# Patient Record
Sex: Female | Born: 1940 | Race: White | Hispanic: No | Smoking: Never smoker
Health system: Southern US, Community
[De-identification: ages and names within clinical notes are randomized; demographics above are authoritative.]

## PROBLEM LIST (undated history)

## (undated) DIAGNOSIS — N200 Calculus of kidney: Secondary | ICD-10-CM

## (undated) DIAGNOSIS — I10 Essential (primary) hypertension: Secondary | ICD-10-CM

## (undated) DIAGNOSIS — D3500 Benign neoplasm of unspecified adrenal gland: Secondary | ICD-10-CM

## (undated) DIAGNOSIS — E7800 Pure hypercholesterolemia, unspecified: Secondary | ICD-10-CM

## (undated) DIAGNOSIS — I480 Paroxysmal atrial fibrillation: Secondary | ICD-10-CM

## (undated) DIAGNOSIS — K219 Gastro-esophageal reflux disease without esophagitis: Secondary | ICD-10-CM

## (undated) HISTORY — DX: Benign neoplasm of unspecified adrenal gland: D35.00

## (undated) HISTORY — DX: Paroxysmal atrial fibrillation: I48.0

## (undated) HISTORY — DX: Calculus of kidney: N20.0

## (undated) HISTORY — DX: Essential (primary) hypertension: I10

---

## 1987-08-18 HISTORY — PX: CHOLECYSTECTOMY: SHX55

## 1998-06-21 ENCOUNTER — Ambulatory Visit (HOSPITAL_COMMUNITY): Admission: RE | Admit: 1998-06-21 | Discharge: 1998-06-21 | Payer: Self-pay | Admitting: Family Medicine

## 1998-06-21 ENCOUNTER — Encounter: Payer: Self-pay | Admitting: Family Medicine

## 2000-09-01 ENCOUNTER — Ambulatory Visit (HOSPITAL_COMMUNITY): Admission: RE | Admit: 2000-09-01 | Discharge: 2000-09-01 | Payer: Self-pay | Admitting: Family Medicine

## 2000-09-01 ENCOUNTER — Encounter: Payer: Self-pay | Admitting: Family Medicine

## 2001-09-01 ENCOUNTER — Ambulatory Visit (HOSPITAL_COMMUNITY): Admission: RE | Admit: 2001-09-01 | Discharge: 2001-09-01 | Payer: Self-pay | Admitting: Family Medicine

## 2001-09-01 ENCOUNTER — Encounter: Payer: Self-pay | Admitting: Family Medicine

## 2001-09-29 ENCOUNTER — Other Ambulatory Visit: Admission: RE | Admit: 2001-09-29 | Discharge: 2001-09-29 | Payer: Self-pay | Admitting: Family Medicine

## 2003-04-10 ENCOUNTER — Other Ambulatory Visit: Admission: RE | Admit: 2003-04-10 | Discharge: 2003-04-10 | Payer: Self-pay | Admitting: Family Medicine

## 2005-11-03 ENCOUNTER — Other Ambulatory Visit: Admission: RE | Admit: 2005-11-03 | Discharge: 2005-11-03 | Payer: Self-pay | Admitting: Family Medicine

## 2006-09-03 ENCOUNTER — Emergency Department (HOSPITAL_COMMUNITY): Admission: EM | Admit: 2006-09-03 | Discharge: 2006-09-03 | Payer: Self-pay | Admitting: Emergency Medicine

## 2009-06-19 ENCOUNTER — Other Ambulatory Visit: Admission: RE | Admit: 2009-06-19 | Discharge: 2009-06-19 | Payer: Self-pay | Admitting: Family Medicine

## 2009-09-17 ENCOUNTER — Ambulatory Visit (HOSPITAL_BASED_OUTPATIENT_CLINIC_OR_DEPARTMENT_OTHER): Admission: RE | Admit: 2009-09-17 | Discharge: 2009-09-17 | Payer: Self-pay | Admitting: Otolaryngology

## 2010-01-15 DIAGNOSIS — D3500 Benign neoplasm of unspecified adrenal gland: Secondary | ICD-10-CM

## 2010-01-15 DIAGNOSIS — N200 Calculus of kidney: Secondary | ICD-10-CM

## 2010-01-15 HISTORY — DX: Calculus of kidney: N20.0

## 2010-01-15 HISTORY — DX: Benign neoplasm of unspecified adrenal gland: D35.00

## 2010-02-06 ENCOUNTER — Encounter: Admission: RE | Admit: 2010-02-06 | Discharge: 2010-02-06 | Payer: Self-pay | Admitting: Family Medicine

## 2010-02-19 ENCOUNTER — Encounter: Admission: RE | Admit: 2010-02-19 | Discharge: 2010-02-19 | Payer: Self-pay | Admitting: Family Medicine

## 2010-10-28 ENCOUNTER — Encounter (HOSPITAL_COMMUNITY): Payer: Self-pay

## 2010-11-06 ENCOUNTER — Ambulatory Visit (HOSPITAL_COMMUNITY): Payer: Medicare Other | Attending: Family Medicine

## 2010-11-06 DIAGNOSIS — M810 Age-related osteoporosis without current pathological fracture: Secondary | ICD-10-CM | POA: Insufficient documentation

## 2010-12-11 ENCOUNTER — Encounter: Payer: Self-pay | Admitting: Internal Medicine

## 2011-01-17 ENCOUNTER — Encounter: Payer: Self-pay | Admitting: Internal Medicine

## 2011-01-21 ENCOUNTER — Ambulatory Visit (INDEPENDENT_AMBULATORY_CARE_PROVIDER_SITE_OTHER): Payer: Medicare Other | Admitting: Internal Medicine

## 2011-01-21 ENCOUNTER — Encounter: Payer: Self-pay | Admitting: Internal Medicine

## 2011-01-21 VITALS — BP 188/88 | HR 56 | Ht 63.0 in | Wt 137.0 lb

## 2011-01-21 DIAGNOSIS — I4891 Unspecified atrial fibrillation: Secondary | ICD-10-CM

## 2011-01-21 DIAGNOSIS — I498 Other specified cardiac arrhythmias: Secondary | ICD-10-CM

## 2011-01-21 DIAGNOSIS — I10 Essential (primary) hypertension: Secondary | ICD-10-CM

## 2011-01-21 DIAGNOSIS — R001 Bradycardia, unspecified: Secondary | ICD-10-CM

## 2011-01-21 DIAGNOSIS — R079 Chest pain, unspecified: Secondary | ICD-10-CM | POA: Insufficient documentation

## 2011-01-21 HISTORY — DX: Unspecified atrial fibrillation: I48.91

## 2011-01-21 HISTORY — DX: Bradycardia, unspecified: R00.1

## 2011-01-21 NOTE — Assessment & Plan Note (Signed)
This has undergone evaluation by Dr. Mayford Knife. I doubt that she has coronary disease. I have asked the patient follow back up with her primary cardiologist if her symptoms return.

## 2011-01-21 NOTE — Assessment & Plan Note (Signed)
She is currently asymptomatic. Ultimately she may require permanent pacemaker insertion. At the present time I think this can be avoided though

## 2011-01-21 NOTE — Patient Instructions (Signed)
Your physician recommends that you schedule a follow-up appointment in: 6-8 weeks with Dr Ladona Ridgel  Your physician recommends that you continue on your current medications as directed. Please refer to the Current Medication list given to you today.

## 2011-01-21 NOTE — Progress Notes (Signed)
HPI [name] returns today for followup. She is a pleasant 70 yo woman with a h/o PAF, HTN, bradycardia and is on Rhythmol. She has multiple complaints today. He recently she had experienced palpitations and rapid heart rates. On her current dose of Rythmol, her symptoms are much improved. She does complain of chest discomfort. She has undergone perfusion stress testing under the direction of Dr. Carolanne Grumbling. By the patient's report, this was all negative. She denies peripheral edema. She denies PND and denies orthopnea. She has not had syncope.she notes that when her heart is in rhythm, her blood pressure tends to be elevated. When her heart is out of rhythm, her blood pressure tends to be lower. In addition, when she is out of rhythm she feels fatigue, weakness, and may days. Allergies  Allergen Reactions  . Codeine      Current Outpatient Prescriptions  Medication Sig Dispense Refill  . acetaminophen (TYLENOL) 325 MG tablet Take 325 mg by mouth every 6 (six) hours as needed.        Marland Kitchen aspirin 81 MG tablet Take 81 mg by mouth 2 (two) times daily.        . metoprolol (LOPRESSOR) 100 MG tablet Take 100 mg by mouth 2 (two) times daily.        Marland Kitchen omeprazole (PRILOSEC) 20 MG capsule Take 20 mg by mouth daily.        . propafenone (RYTHMOL SR) 325 MG 12 hr capsule Take 325 mg by mouth 2 (two) times daily.        . ranitidine (ZANTAC) 150 MG tablet Take 150 mg by mouth daily.        Marland Kitchen warfarin (COUMADIN) 5 MG tablet as directed.        . zoledronic acid (RECLAST) 5 MG/100ML SOLN As directed once a yr osteoporosis       . DISCONTD: Omeprazole-Sodium Bicarbonate (ZEGERID) 20-1100 MG CAPS Take 1 capsule by mouth.           Past Medical History  Diagnosis Date  . Paroxysmal atrial fibrillation     chads score 2 but she has maintained NSR on Rhythmol until 11/21/10  . Hypertension   . Normal nuclear stress test 2008    low risk  . Osteoporosis   . Kidney stone 6/11    rt. on ct 6/11  . Adrenal  adenoma 6/11    lt on ct 6/11    ROS:   All systems reviewed and negative except as noted in the HPI.   Past Surgical History  Procedure Date  . Cholecystectomy 1989  . Cesarean section 1978-1979    x-2     Family History  Problem Relation Age of Onset  . Heart attack Father   . Hip fracture Mother     complication  . Lung cancer Brother   . Heart attack Brother     massive  . Other Brother     mva  . Thyroid cancer Sister   . Atrial fibrillation Sister   . Ovarian cancer Sister     surviver     History   Social History  . Marital Status: Widowed    Spouse Name: N/A    Number of Children: N/A  . Years of Education: N/A   Occupational History  . Not on file.   Social History Main Topics  . Smoking status: Never Smoker   . Smokeless tobacco: Not on file  . Alcohol Use: No  . Drug Use:  No  . Sexually Active: Not on file   Other Topics Concern  . 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 HEART:  Regular rate rhythm, no murmurs, no rubs, no clicks Abd:  Flat, positive bowel sounds, no organomegally, no rebound, no guarding Ext:  2 plus pulses, no edema, no cyanosis, no clubbing Skin:  No rashes no nodules Neuro:  CN II through XII intact, motor grossly intact  EKG Sinus bradycardia  Assess/Plan:

## 2011-01-21 NOTE — Assessment & Plan Note (Signed)
Today she is in sinus rhythm and it sounds like from her history that her a fibrillation has been well controlled for last couple of weeks. We talked about the treatment options with regard to her atrial fibrillation. Her electrocardiogram today does not show any evidence of prolongation of her QRS. For this reason I would recommend that she continue her current antiarrhythmic drug and followup with me in several weeks. I have asked her to keep a log of her heart rate and blood pressure so that we can try to understand better how much atrial fibrillation she is having.

## 2011-03-17 ENCOUNTER — Ambulatory Visit (INDEPENDENT_AMBULATORY_CARE_PROVIDER_SITE_OTHER): Payer: Medicare Other | Admitting: Internal Medicine

## 2011-03-17 ENCOUNTER — Encounter: Payer: Self-pay | Admitting: Internal Medicine

## 2011-03-17 DIAGNOSIS — I4891 Unspecified atrial fibrillation: Secondary | ICD-10-CM

## 2011-03-17 DIAGNOSIS — R079 Chest pain, unspecified: Secondary | ICD-10-CM

## 2011-03-17 DIAGNOSIS — I498 Other specified cardiac arrhythmias: Secondary | ICD-10-CM

## 2011-03-17 DIAGNOSIS — R001 Bradycardia, unspecified: Secondary | ICD-10-CM

## 2011-03-17 NOTE — Patient Instructions (Signed)
Your physician wants you to follow-up in: 6 months with Dr Taylor You will receive a reminder letter in the mail two months in advance. If you don't receive a letter, please call our office to schedule the follow-up appointment.  

## 2011-03-18 ENCOUNTER — Encounter: Payer: Self-pay | Admitting: Internal Medicine

## 2011-03-18 NOTE — Assessment & Plan Note (Signed)
Her symptoms have been well controlled. I have asked her to continue her current meds.

## 2011-03-18 NOTE — Assessment & Plan Note (Signed)
Her symptoms are well controlled. Continue current meds.

## 2011-03-18 NOTE — Assessment & Plan Note (Signed)
Her bradycardia remains asymptomatic.

## 2011-03-18 NOTE — Progress Notes (Signed)
HPI [name] returns today for followup. She is a pleasant 70 yo woman with a h/o PAF, HTN, chronic coumadin therapy. She denies c/p, sob, or peripheral edema. She has had minimal peripheral edema. Allergies  Allergen Reactions  . Codeine      Current Outpatient Prescriptions  Medication Sig Dispense Refill  . acetaminophen (TYLENOL) 325 MG tablet Take 325 mg by mouth every 6 (six) hours as needed.        . Cholecalciferol (VITAMIN D3) 1000 UNITS CAPS Take 1 capsule by mouth daily.        . metoprolol (LOPRESSOR) 100 MG tablet Take 100 mg by mouth 2 (two) times daily.        Marland Kitchen omeprazole (PRILOSEC) 20 MG capsule Take 20 mg by mouth daily.        . propafenone (RYTHMOL SR) 325 MG 12 hr capsule Take 325 mg by mouth 2 (two) times daily.        . ranitidine (ZANTAC) 150 MG tablet Take 150 mg by mouth daily.        Marland Kitchen warfarin (COUMADIN) 5 MG tablet as directed.        . zoledronic acid (RECLAST) 5 MG/100ML SOLN As directed once a yr osteoporosis          Past Medical History  Diagnosis Date  . Paroxysmal atrial fibrillation     chads score 2 but she has maintained NSR on Rhythmol until 11/21/10  . Hypertension   . Normal nuclear stress test 2008    low risk  . Osteoporosis   . Kidney stone 6/11    rt. on ct 6/11  . Adrenal adenoma 6/11    lt on ct 6/11    ROS:   All systems reviewed and negative except as noted in the HPI.   Past Surgical History  Procedure Date  . Cholecystectomy 1989  . Cesarean section 1978-1979    x-2     Family History  Problem Relation Age of Onset  . Heart attack Father   . Hip fracture Mother     complication  . Lung cancer Brother   . Heart attack Brother     massive  . Other Brother     mva  . Thyroid cancer Sister   . Atrial fibrillation Sister   . Ovarian cancer Sister     surviver     History   Social History  . Marital Status: Widowed    Spouse Name: N/A    Number of Children: N/A  . Years of Education: N/A    Occupational History  . Not on file.   Social History Main Topics  . Smoking status: Never Smoker   . Smokeless tobacco: Not on file  . Alcohol Use: No  . Drug Use: No  . Sexually Active: Not on file   Other Topics Concern  . 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 HEART:  Regular rate rhythm, no murmurs, no rubs, no clicks Abd:  soft, positive bowel sounds, no organomegally, no rebound, no guarding Ext:  2 plus pulses, no edema, no cyanosis, no clubbing Skin:  No rashes no nodules Neuro:  CN II through XII intact, motor grossly intact   Assess/Plan:

## 2011-05-06 ENCOUNTER — Other Ambulatory Visit: Payer: Self-pay | Admitting: Gastroenterology

## 2011-05-06 DIAGNOSIS — R109 Unspecified abdominal pain: Secondary | ICD-10-CM

## 2011-05-11 ENCOUNTER — Ambulatory Visit
Admission: RE | Admit: 2011-05-11 | Discharge: 2011-05-11 | Disposition: A | Discharge status: Home / Self Care | Payer: Medicare Other | Source: Ambulatory Visit | Attending: Gastroenterology | Admitting: Gastroenterology

## 2011-05-11 DIAGNOSIS — R109 Unspecified abdominal pain: Secondary | ICD-10-CM

## 2011-05-11 MED ORDER — IOHEXOL 300 MG/ML  SOLN: 100.0000 mL | Freq: Once | INTRAMUSCULAR | Status: AC | PRN

## 2011-11-05 ENCOUNTER — Encounter (HOSPITAL_COMMUNITY): Payer: Self-pay | Admitting: Pharmacy Technician

## 2011-11-06 ENCOUNTER — Other Ambulatory Visit: Payer: Self-pay | Admitting: Cardiology

## 2011-11-06 ENCOUNTER — Encounter: Payer: Self-pay | Admitting: Cardiology

## 2011-11-06 NOTE — H&P (Signed)
Office Visit     Patient: Monica [name], Monica [name] Provider: Armanda Magic, MD  DOB: 11-07-1940 Age: 71 Y Sex: Female Date: 11/05/2011  Phone: [phone]   Address: [address], [place], [id number]  Pcp: CANDACE SMITH       Subjective:     CC:    1. NUCLEAR FOLLOW UP.        HPI:  General:  Mrs Skousen is a 71 yo female with a hx of Atrial fibrillation. She had the Paroxysmal Atrial fib starting 2008, that reoccurred in 2010, then 4/12 had to go to ED with Atrial fibrillation, She was then placed her on Coumadin therapy. 4/12: testing including nuclear stress test without ischemia, echocardiogram normal LV function, with stage 3 diastolic dysfunction, mild - mod TR/MR. In October last year she was having episodes of increse HR in the AFib with Diltiazem added to her therapy and her heart rates have been well controlled since. She was seen by my NP after she had episode of chest pain that awakened her from sleep radiating into back, she used Tum and heating pad but remained for 1 hour then eased off but she did have soreness in back that has resolved. She had not taken her pm Omeprazole dosing. This was associated with diaphoresis, no GI complaints, palpitaitons, dizziness nor syncope. She also noted chest was soreness while lying on left side. only. Since then she has noted increase fatigue but no futher chest pain. She underwent nuclear stress test that showed no ischemia. Since then she has gone back on Omeprazole. She now has burning in her chest which occurs mainly at night. She took Mylanta which improved the discomfort. This am she felt weakness and took her BP and it was 112/61 with a HR of 37bpm..        ROS:  See HPI, A twelve system review was perfomed at today's visit. For pertinent positives and negatives see HPI.       Medical History: paroxysmal atrial fibrillation chads score 2 but she has maintained NSR on Rhythmol until 11/21/10, Hypertension, NUC stress 2008 - low risk. ,  Echo - 2008 - LA 46mm, normal EF, normal wall thickness LV, mild MR, TR., Osteoporosis, R kidney stone on CT 6/11, L adrenal adenoma on CT 6/11, normal nuclear stress test 11/2010 Dr.Lux Meaders.        Family History:        Social History:  General:  History of smoking cigarettes: Never smoked.  no Smoking.  no Tobacco Exposure.  no Alcohol.  Caffeine: yes, 1 serving daily, coffee.  no Recreational drug use.  Exercise: nothing structured.  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  Education: [employer].  Marital Status: single, widowed, 2006.  Children: 2 children both boys, grandchild x 1.  Religion: not at the moment.  Firearms: yes, gun safety is practiced.  Seat belt use: always.        Medications: Omeprazole 20 MG Capsule Delayed Release 1 capsule twice daily, Reclast 5 MG/100ML Solution as directed once a yr for osteoporosis, Zantac 75 mg Tablet 1 tablet once a day, Acetaminophen 500 MG Capsule 1 capsule as needed every 6 hrs as needed, Rythmol SR 325 MG Dr. Mayford Knife Capsule Extended Release 12 Hour 1 capsule Twice a day, Metoprolol Tartrate 100 MG Dr. Mayford Knife Tablet 1 tablet twice a day, Warfarin Sodium 5 MG (DR Jiah Bari) Tablet 1/2 tablet once a day or as directecd, Dilt-CD 120 MG dr. Windy Kalata Extended Release 24  Hour 1 capsule Once a day, Medication List reviewed and reconciled with the patient       Allergies: Codeine (for allergy): nausea: Side Effects.       Objective:     Vitals: Wt 148, Wt change 1.8 lb, Ht 63, BMI 26.21, Pulse sitting 80, BP sitting 158/94.       Examination:  Cardiology, General:  GENERAL APPEARANCE: pleasant, NAD.  HEENT: unremarkable.  CAROTID UPSTROKE: normal, no bruit.  JVD: flat.  HEART SOUNDS: regular, normal S1, S2, no S3 or S4.  MURMUR: absent.  LUNGS: no rales or wheezes.  ABDOMEN: soft, non tender, positive bowel sounds, no masses felt.  EXTREMITIES: no leg edema.  PERIPHERAL PULSES: 2 plus bilateral.          Assessment:     Assessment:  1. Atrial fibrillation - 427.31 (Primary)  2. Anticoagulated - V58.61  3. Benign hypertensive heart disease without heart failure - 402.10  4. Esophageal reflux - 530.81  5. Diastolic dysfunction - 429.9  6. Weakness - 780.79    Plan:     1. Atrial fibrillation Continue Rythmol SR Capsule Extended Release 12 Hour, 325 MG Dr. Mayford Knife, 1 capsule, Orally, Twice a day ; Continue Metoprolol Tartrate Tablet, 100 MG Dr. Mayford Knife, 1 tablet, Orally, twice a day ; Continue Warfarin Sodium Tablet, 5 MG (DR Javaeh Muscatello), 1/2 tablet, Orally, once a day or as directecd ; Continue Dilt-CD Capsule Extended Release 24 Hour, 120 MG dr. Mayford Knife, 1 capsule, Orally, Once a day .  LAB: Basic Metabolic Normal    GLUCOSE 106 70-99 - mg/dL H   BUN 18 4-54 - mg/dL    CREATININE 0.98 1.19-1.47 - mg/dl    eGFR (NON-AFRICAN AMERICAN) 59 >60 - calc L   eGFR (AFRICAN AMERICAN) 72 >60 - calc    SODIUM 139 136-145 - mmol/L    POTASSIUM 4.5 3.5-5.5 - mmol/L    CHLORIDE 104 98-107 - mmol/L    C02 28 22-32 - mg/dL    ANION GAP 82.9 5.6-21.3 - mmol/L    CALCIUM 9.8 8.6-10.3 - mg/dL     Polina Burmaster M 08/65/7846 02:50:48 PM > was this fasting Redmond Baseman 11/05/2011 03:05:15 PM > Pt not fasting. Janina Mayo 11/05/2011 03:13:34 PM > let he know labs ok Redmond Baseman 11/05/2011 03:50:26 PM > Pt notified.    LAB: CBC with Diff Normal    WBC 7.7 4.0-11.0 - K/ul    RBC 4.35 4.20-5.40 - M/uL    HGB 12.7 12.0-16.0 - g/dL    HCT 96.2 95.2-84.1 - %    MCH 29.3 27.0-33.0 - pg    MPV 7.8 7.5-10.7 - fL    MCV 89.5 81.0-99.0 - fL    MCHC 32.7 32.0-36.0 - g/dL    RDW 32.4 40.1-02.7 - %    PLT 241 150-400 - K/uL    NEUT % 76.5 43.3-71.9 - % H   LYMPH% 15.5 16.8-43.5 - % L   MONO % 6.3 4.6-12.4 - %    EOS % 1.1 0.0-7.8 - %    BASO % 0.6 0.0-1.0 - %    NEUT # 5.9 1.9-7.2 - K/uL    LYMPH# 1.20 1.10-2.70 - K/uL    MONO # 0.5 0.3-0.8 - K/uL    EOS # 0.1 0.0-0.6 - K/uL    BASO # 0.0  0.0-0.1 - K/uL     Humna Moorehouse M 11/05/2011 11:00:52 AM > Redmond Baseman 11/05/2011 01:45:33 PM > Pt notified.    LAB: PT (Prothrombin  Time) (161096) okay for cardioversion    Prothrombin Time 32.5 9.1-12.0 - SEC H   INR 3.1 0.8-1.2 - H    Smart,Jeremy 11/06/2011 10:36:14 AM > INR okay for CV. pending cardioversion 11/10/11. patient to continue warfarin 2.5 mg qd and recheck INR with me 11/2011 as scheduled. Janina Mayo 11/06/2011 10:57:38 AM > agree    She is still in afib today by exam so I will check a PT/INR and if therapeutic will set up for DCCV.       2. Benign hypertensive heart disease without heart failure Continue Dilt-CD Capsule Extended Release 24 Hour, 120 MG dr. Mayford Knife, 1 capsule, Orally, Once a day ; Continue Metoprolol Tartrate Tablet, 100 MG Dr. Mayford Knife, 1 tablet, Orally, twice a day .       3. Esophageal reflux Continue Omeprazole Capsule Delayed Release, 20 MG, 1 capsule, Orally, twice daily ; Continue Zantac Tablet, 75 mg, 1 tablet, Orally, once a day .  I reassured her that her stress test was normal. Her chest burning comes on mainly at night and improves on omeprazole and mylanta. I suspect she is having an exacerbation of her GERD symptoms. I have recommended that we refer her to GI for further evaluation.  Referral EA:[facility] Reason:EGI - CONSULT 11/09/2011       4. Weakness  I suspect her weakness is due to afib.        Immunizations:        Labs:        Procedure Codes: 11914 ECL BMP, [id number] ECL CBC PLATELET DIFF, [id number] BLOOD COLLECTION ROUTINE VENIPUNCTURE       Preventive:        Provider: Armanda Magic, MD  Patient: Kiandria, Clum DOB: October 05, 1940 Date: 11/05/2011

## 2011-11-07 NOTE — Progress Notes (Signed)
Addended by: Armanda Magic on: 11/07/2011 10:23 AM   Modules accepted: Orders

## 2011-11-10 ENCOUNTER — Ambulatory Visit (HOSPITAL_COMMUNITY)
Admission: RE | Admit: 2011-11-10 | Discharge: 2011-11-10 | Disposition: A | Discharge status: Home / Self Care | Payer: Medicare Other | Source: Ambulatory Visit | Attending: Cardiology | Admitting: Cardiology

## 2011-11-10 ENCOUNTER — Other Ambulatory Visit: Payer: Self-pay

## 2011-11-10 ENCOUNTER — Encounter (HOSPITAL_COMMUNITY)
Admission: RE | Disposition: A | Discharge status: Home / Self Care | Payer: Self-pay | Source: Ambulatory Visit | Attending: Cardiology

## 2011-11-10 DIAGNOSIS — I119 Hypertensive heart disease without heart failure: Secondary | ICD-10-CM | POA: Insufficient documentation

## 2011-11-10 DIAGNOSIS — R5381 Other malaise: Secondary | ICD-10-CM | POA: Insufficient documentation

## 2011-11-10 DIAGNOSIS — I4891 Unspecified atrial fibrillation: Secondary | ICD-10-CM | POA: Insufficient documentation

## 2011-11-10 DIAGNOSIS — R5383 Other fatigue: Secondary | ICD-10-CM | POA: Insufficient documentation

## 2011-11-10 DIAGNOSIS — Z538 Procedure and treatment not carried out for other reasons: Secondary | ICD-10-CM | POA: Insufficient documentation

## 2011-11-10 DIAGNOSIS — Z7901 Long term (current) use of anticoagulants: Secondary | ICD-10-CM | POA: Insufficient documentation

## 2011-11-10 DIAGNOSIS — K219 Gastro-esophageal reflux disease without esophagitis: Secondary | ICD-10-CM | POA: Insufficient documentation

## 2011-11-10 DIAGNOSIS — M810 Age-related osteoporosis without current pathological fracture: Secondary | ICD-10-CM | POA: Insufficient documentation

## 2011-11-10 SURGERY — CARDIOVERSION
Anesthesia: General | Wound class: Clean

## 2011-11-10 NOTE — Progress Notes (Signed)
EKG showing NSR; pt also states "I think I'm back in rhythm."  Dr. Mayford Knife notified.; EKG faxed per request.

## 2011-11-11 ENCOUNTER — Encounter (HOSPITAL_COMMUNITY): Payer: Self-pay | Admitting: Cardiology

## 2012-03-17 ENCOUNTER — Other Ambulatory Visit: Payer: Self-pay | Admitting: Family Medicine

## 2012-04-04 ENCOUNTER — Encounter (HOSPITAL_COMMUNITY): Payer: Self-pay

## 2012-04-04 ENCOUNTER — Encounter (HOSPITAL_COMMUNITY)
Admission: RE | Admit: 2012-04-04 | Discharge: 2012-04-04 | Disposition: A | Discharge status: Home / Self Care | Payer: Medicare Other | Source: Ambulatory Visit | Attending: Family Medicine | Admitting: Family Medicine

## 2012-04-04 DIAGNOSIS — M810 Age-related osteoporosis without current pathological fracture: Secondary | ICD-10-CM | POA: Insufficient documentation

## 2012-04-04 MED ORDER — SODIUM CHLORIDE 0.9 % IV SOLN
INTRAVENOUS | Status: AC
  Administered 2012-04-04: 250 mL via INTRAVENOUS

## 2012-04-04 MED ORDER — ZOLEDRONIC ACID 5 MG/100ML IV SOLN
5.0000 mg | Freq: Once | INTRAVENOUS | Status: AC
  Administered 2012-04-04: 5 mg via INTRAVENOUS
  Filled 2012-04-04: qty 100

## 2012-05-02 ENCOUNTER — Emergency Department (HOSPITAL_COMMUNITY): Payer: Medicare Other

## 2012-05-02 ENCOUNTER — Emergency Department (HOSPITAL_COMMUNITY)
Admission: EM | Admit: 2012-05-02 | Discharge: 2012-05-02 | Disposition: A | Discharge status: Home / Self Care | Payer: Medicare Other | Attending: Emergency Medicine | Admitting: Emergency Medicine

## 2012-05-02 ENCOUNTER — Encounter (HOSPITAL_COMMUNITY): Payer: Self-pay | Admitting: *Deleted

## 2012-05-02 DIAGNOSIS — R9431 Abnormal electrocardiogram [ECG] [EKG]: Secondary | ICD-10-CM | POA: Insufficient documentation

## 2012-05-02 DIAGNOSIS — I4891 Unspecified atrial fibrillation: Secondary | ICD-10-CM | POA: Insufficient documentation

## 2012-05-02 DIAGNOSIS — I10 Essential (primary) hypertension: Secondary | ICD-10-CM | POA: Insufficient documentation

## 2012-05-02 DIAGNOSIS — R109 Unspecified abdominal pain: Secondary | ICD-10-CM | POA: Insufficient documentation

## 2012-05-02 DIAGNOSIS — M810 Age-related osteoporosis without current pathological fracture: Secondary | ICD-10-CM | POA: Insufficient documentation

## 2012-05-02 DIAGNOSIS — R55 Syncope and collapse: Secondary | ICD-10-CM | POA: Insufficient documentation

## 2012-05-02 DIAGNOSIS — I4460 Unspecified fascicular block: Secondary | ICD-10-CM | POA: Insufficient documentation

## 2012-05-02 DIAGNOSIS — R5383 Other fatigue: Secondary | ICD-10-CM | POA: Insufficient documentation

## 2012-05-02 DIAGNOSIS — R079 Chest pain, unspecified: Secondary | ICD-10-CM | POA: Insufficient documentation

## 2012-05-02 DIAGNOSIS — R5381 Other malaise: Secondary | ICD-10-CM | POA: Insufficient documentation

## 2012-05-02 LAB — PROTIME-INR
INR: 2.34 — ABNORMAL HIGH (ref 0.00–1.49)
Prothrombin Time: 26 seconds — ABNORMAL HIGH (ref 11.6–15.2)

## 2012-05-02 LAB — CBC WITH DIFFERENTIAL/PLATELET
Eosinophils Relative: 2 % (ref 0–5)
HCT: 42.2 % (ref 36.0–46.0)
Hemoglobin: 14 g/dL (ref 12.0–15.0)
Lymphocytes Relative: 25 % (ref 12–46)
MCHC: 33.2 g/dL (ref 30.0–36.0)
MCV: 85.4 fL (ref 78.0–100.0)
Monocytes Absolute: 0.6 10*3/uL (ref 0.1–1.0)
Monocytes Relative: 8 % (ref 3–12)
Neutro Abs: 4.7 10*3/uL (ref 1.7–7.7)
WBC: 7.3 10*3/uL (ref 4.0–10.5)

## 2012-05-02 LAB — COMPREHENSIVE METABOLIC PANEL
ALT: 22 U/L (ref 0–35)
Albumin: 3.9 g/dL (ref 3.5–5.2)
Calcium: 9.6 mg/dL (ref 8.4–10.5)
GFR calc Af Amer: 68 mL/min — ABNORMAL LOW (ref >90)
Glucose, Bld: 110 mg/dL — ABNORMAL HIGH (ref 70–99)
Potassium: 5 mEq/L (ref 3.5–5.1)
Sodium: 135 mEq/L (ref 135–145)
Total Protein: 7.6 g/dL (ref 6.0–8.3)

## 2012-05-02 MED ORDER — DILTIAZEM HCL 50 MG/10ML IV SOLN
10.0000 mg | Freq: Once | INTRAVENOUS | Status: AC
  Administered 2012-05-02: 10 mg via INTRAVENOUS
  Filled 2012-05-02: qty 2

## 2012-05-02 NOTE — ED Notes (Signed)
Patient feeling dizzy and weak upon discharge, Dr. Lynelle Doctor aware.  Patient expressed that she feels comfortable with going home and will follow up with her cardiologist tomorrow.

## 2012-05-02 NOTE — ED Notes (Signed)
Vitals stable upon discharge

## 2012-05-02 NOTE — ED Provider Notes (Signed)
History     CSN: [id number]  Arrival date & time 05/02/12  1343   First MD Initiated Contact with Patient 05/02/12 1746      Chief Complaint  Patient presents with  . Near Syncope  . Weakness  . Chest Pain  . Abdominal Pain    HPI Pt presents to the ED with recurrent a fib.  She took her blood pressure today and realized she was back in a fib  She was feeling weak today which increased when she tried to stand.  She also felt pain in her abdomen and her chest today.  She had a feeling that she had a lot of gas.  These symptoms with her abdomen started on Saturday.    No vomiting, no diarrhea.  No fever.  No dysuria.  No cough.  No shortness of breath today.  No abdominal pain or chest pain currently.  Past Medical History  Diagnosis Date  . Hypertension   . Normal nuclear stress test 2008    low risk  . Osteoporosis   . Kidney stone 6/11    rt. on ct 6/11  . 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 Paroxysmal atrial fibrillation     chads score 2 but she has maintained NSR on Rhythmol until 11/21/10  . Kidney stones     Past Surgical History  Procedure Date  . Cholecystectomy 1989  . Cesarean section 1978-1979    x-2  . Cardioversion 11/10/2011    Procedure: CARDIOVERSION;  Surgeon: Quintella Reichert, MD;  Location: [facility] OR;  Service: Cardiovascular;  Laterality: N/A;  [place]  Family History  Problem Relation Age of Onset  . Heart attack Father   . Hip fracture Mother     complication  . Lung cancer Brother   . Heart attack Brother     massive  . Other Brother     mva  . Thyroid cancer Sister   . Atrial fibrillation Sister   . Ovarian cancer Sister     surviver    History  Substance Use Topics  . Smoking status: Never Smoker   . Smokeless tobacco: Not on file  . Alcohol Use: No    OB History    Grav Para Term Preterm Abortions TAB SAB Ect Mult Living                  Review of Systems  All other systems reviewed and are negative.    Allergies    Codeine  Home Medications   Current Outpatient Rx  Name Route Sig Dispense Refill  . ACETAMINOPHEN 500 MG PO TABS Oral Take 1,000 mg by mouth every 6 (six) hours as needed. For pain    . CALCIUM CARBONATE 1250 MG PO CAPS Oral Take 1,250 mg by mouth 2 (two) times daily with a meal.    . VITAMIN D 1000 UNITS PO TABS Oral Take 1,000 Units by mouth every evening.     Marland Kitchen METOPROLOL TARTRATE 100 MG PO TABS Oral Take 100 mg by mouth 2 (two) times daily.      Marland Kitchen PROPAFENONE HCL ER 325 MG PO CP12 Oral Take 325 mg by mouth 2 (two) times daily.      Marland Kitchen RANITIDINE HCL 150 MG PO TABS Oral Take 150 mg by mouth 2 (two) times daily.     . WARFARIN SODIUM 5 MG PO TABS Oral Take 2.5 mg by mouth every evening.     Marland Kitchen  ZOLEDRONIC ACID 5 MG/100ML IV SOLN  As directed once a yr osteoporosis      BP 164/113  Pulse 125  Temp 98.3 F (36.8 C) (Oral)  Resp 16  SpO2 99%  Physical Exam  Nursing note and vitals reviewed. Constitutional: She appears well-developed and well-nourished. No distress.  HENT:  Head: Normocephalic and atraumatic.  Right Ear: External ear normal.  Left Ear: External ear normal.  Eyes: Conjunctivae normal are normal. Right eye exhibits no discharge. Left eye exhibits no discharge. No scleral icterus.  Neck: Neck supple. No tracheal deviation present.  Cardiovascular: Normal rate and intact distal pulses.  An irregularly irregular rhythm present.  Pulmonary/Chest: Effort normal and breath sounds normal. No stridor. No respiratory distress. She has no wheezes. She has no rales.  Abdominal: Soft. Bowel sounds are normal. She exhibits no distension. There is no tenderness. There is no rebound and no guarding.  Musculoskeletal: She exhibits no edema and no tenderness.  Neurological: She is alert. She has normal strength. No sensory deficit. Cranial nerve deficit:  no gross defecits noted. She exhibits normal muscle tone. She displays no seizure activity. Coordination normal.  Skin: Skin is  warm and dry. No rash noted.  Psychiatric: She has a normal mood and affect.    ED Course  Procedures (including critical care time) EKG Rate 109 Wide QRS rhythm, probable atrial fibrillation Left anterior fascicular block Nonspecific ST abnormality Abnormal ECG  Labs Reviewed  COMPREHENSIVE METABOLIC PANEL - Abnormal; Notable for the following:    Glucose, Bld 110 (*)     GFR calc non Af Amer 58 (*)     GFR calc Af Amer 68 (*)     All other components within normal limits  PROTIME-INR - Abnormal; Notable for the following:    Prothrombin Time 26.0 (*)     INR 2.34 (*)     All other components within normal limits  CBC WITH DIFFERENTIAL  POCT I-STAT TROPONIN I  LIPASE, BLOOD   Dg Chest 2 View  05/02/2012  *RADIOLOGY REPORT*  Clinical Data: Chest pain and weakness.  CHEST - 2 VIEW  Comparison: 08/24/2006.  Findings: The cardiac silhouette, mediastinal and hilar contours are within normal limits and stable.  The lungs are clear.  No pleural effusion.  The bony thorax is intact.  IMPRESSION: No acute cardiopulmonary findings.   Original Report Authenticated By: P. Loralie Champagne, M.D.    Dg Abd 1 View  05/02/2012  *RADIOLOGY REPORT*  Clinical Data: Near syncope  ABDOMEN - 1 VIEW  Comparison: CT 05/11/2011  Findings: Negative for bowel obstruction or ileus.  Mild amount of stool in the colon.  Gas in the rectum.  No acute bony changes.  IMPRESSION: No acute abnormality.   Original Report Authenticated By: Camelia Phenes, M.D.       MDM  Patient was given a dose of Cardizem. Her atrial fibrillation has spontaneously resolved. Patient is in a more regular rhythm now with a rate of 72.  Regarding her abdominal pain, her labs and x-rays are reassuring. Patient has no complaints at this time she is eating a sandwich at the bedside. At this time I feel that she is stable for outpatient follow up with her cardiologist. I've explained findings to the patient and her family. I've encouraged  to return emergently as needed for recurrent symptoms       Celene Kras, MD 05/02/12 [phone]

## 2012-05-02 NOTE — ED Notes (Signed)
Pt reports she has also had abdominal pain since Saturday, reports upper abdomen with intermittent episodes of sharp pain. Reports "loud gas noses coming from stomach." pt states her heart monitor this am showed that she was in afib.

## 2012-05-02 NOTE — ED Notes (Signed)
Pt reports waking up this am with weakness, states a couple times she felt like she was going to pass out, reports taking blood pressure at the house and was noted to be elevated and elevated heart rate. Pt reports hx of afib in June and thought she was in afib. Reports nonspecific midsternal chest pain aching in nature, non radiating. Denies diaphoresis, reports mild sob but she has that occasionally.

## 2012-05-02 NOTE — ED Notes (Signed)
Patient transported to X-ray 

## 2012-05-04 ENCOUNTER — Other Ambulatory Visit: Payer: Self-pay | Admitting: Cardiology

## 2012-05-05 ENCOUNTER — Other Ambulatory Visit: Payer: Self-pay | Admitting: Cardiology

## 2012-05-05 NOTE — H&P (Signed)
Office Visit     Patient: Monica [name] Provider: Traci Turner, Monica [name]  DOB: 01/16/1941 Age: 71 Y Sex: Female Date: 05/03/2012  Phone: [phone]   Address: [address], , [place]  Pcp: CANDACE SMITH       Subjective:     CC:    1. TT/ER f/u from last night-afib.        HPI:  General:  The patient presents today for followup of her atrial fibrillation, HTN, diastolic dysfunction and orthostatic hypotension. She says that this past Sunday she felt fatigued and weak and unable to do a lot of activity without getting out of breath. This reoccurred yesterday. She took her BP and she noticed that her heart rate was 98-102bpm and irregular and BP was 175/112mmHg. She went to the ER and she was told that she was in atrial fibrillation and gave her IV Cardizem and she went back into normal rhythm. She was told to continue her home medications. Since then she has continued to feel tired and weak. She says that she feels like her arms and legs are going to fall off if she does any activity. She occasionally notices a slight ache in her chest that is very brief. She denies any syncope but was presyncopal yesterday am when moving around. .        ROS:  See HPI, A twelve system review was perfomed at today'[name] visit. For pertinent positives and negatives see HPI.       Medical History: paroxysmal atrial fibrillation chads score 2 but she has maintained NSR on Rhythmol until 11/21/10, Hypertension, NUC stress 2008 - low risk. , Echo - 2008 - LA 46mm, normal EF, normal wall thickness LV, mild MR, TR., Osteoporosis, R kidney stone on CT 6/11, L adrenal adenoma on CT 6/11, normal nuclear stress test 11/2010 Dr.Turner.        Family History:        Social History:  General:  History of smoking cigarettes: Never smoked.  no Smoking.  no Tobacco Exposure.  no Alcohol.  Caffeine: yes, 1 serving daily, coffee.  no Recreational drug use.  Exercise: nothing structured.  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  Education: High School.  Marital Status: single, widowed, 2006.  Children: 2 children both boys, grandchild x 1.  Religion: not at the moment.  Firearms: yes, gun safety is practiced.  Seat belt use: always.        Medications: Reclast 5 MG/100ML Solution had 8/13 once a yr for osteoporosis, Acetaminophen 500 MG Capsule 1 capsule as needed every 6 hrs as needed, Rythmol SR 325 MG Dr. turner Capsule Extended Release 12 Hour 1 capsule Twice a day, Metoprolol Tartrate 100 MG Dr. Turner Tablet 1 tablet twice a day, Zantac 75 mg Tablet 1 tablet once a day, Omeprazole 20 MG Capsule Delayed Release 1 capsule twice daily, Pravastatin Sodium 20 MG Tablet 1 tablet Once a day, Warfarin Sodium 5 MG (DR [name]) Tablet 1/2 tablet once a day or as directecd, Medication List reviewed and reconciled with the patient       Allergies: Codeine (for allergy): nausea: Side Effects.       Objective:     Vitals: Wt 146.2, Wt change .2 lb, Ht 63, BMI 25.90, Pulse sitting 80, BP sitting 110/80, O BP Lying 134/84, O pulse lying 84, O BP sitting 117/76, O pulse sitting 83, O BP standing 118/79, O pulse standing 85.       Examination:    Cardiology, General:  GENERAL APPEARANCE: pleasant, NAD.  HEENT: unremarkable.  CAROTID UPSTROKE: normal, no bruit.  JVD: flat.  HEART SOUNDS: regular, normal S1, S2, no S3 or S4.  MURMUR: absent.  LUNGS: no rales or wheezes.  ABDOMEN: soft, non tender, positive bowel sounds, no masses felt.  EXTREMITIES: no leg edema.  PERIPHERAL PULSES: 2 plus bilateral.        Assessment:     Assessment:  1. Atrial fibrillation - 427.31 (Primary)  2. Benign hypertensive heart disease without heart failure - 402.10  3. Anticoagulated - V58.61  4. Diastolic dysfunction - 429.9    Plan:     1. Atrial fibrillation Continue Rythmol SR Capsule Extended Release 12 Hour, 325 MG Dr. turner, 1 capsule, Orally, Twice a day ; Continue  Metoprolol Tartrate Tablet, 100 MG Dr. Turner, 1 tablet, Orally, twice a day ; Continue Warfarin Sodium Tablet, 5 MG (DR [name]), 1/2 tablet, Orally, once a day or as directecd .  Diagnostic Imaging:EKG atrial flutter, Corson,Danielle 05/03/2012 03:21:01 PM > [name],TRACI M 05/03/2012 03:34:29 PM >  She currently is in atrial flutter with controlled heart rate. I suspect that atrial flutter is what is making her feel fatigued with loss of atrial kick. The options for treatment are plan for cardioversion or referral to EP for possible ablation of atypical flutter. Since she broken through on Rhythmol I think that consideration of ablation may be the best option. I have discussed this with Dr. Taylor today. He recommends going ahead with cardioversion on 9/20. Her INR has been therapeutic for the past few months. He then recommends that we set her up with Dr. Allred for possible afib/flutter ablation. We will go ahead with cardioversion on 9/20 and then OV with Dr. Allred. Patient understands plan and agrees to proceed.       2. Benign hypertensive heart disease without heart failure Continue Metoprolol Tartrate Tablet, 100 MG Dr. Turner, 1 tablet, Orally, twice a day .       3. Diastolic dysfunction Continue Metoprolol Tartrate Tablet, 100 MG Dr. Turner, 1 tablet, Orally, twice a day .        Immunizations:        Labs:        Procedure Codes: 93000 EKG I AND R       Preventive:         Follow Up: 2 Weeks with TT (Reason: aflutter)      Provider: Traci Turner, Monica [name]  Patient: Monica [name], Monica [name] DOB: 10/26/1940 Date: 05/03/2012     

## 2012-05-06 ENCOUNTER — Encounter (HOSPITAL_COMMUNITY): Payer: Self-pay | Admitting: *Deleted

## 2012-05-06 ENCOUNTER — Encounter (HOSPITAL_COMMUNITY)
Admission: RE | Disposition: A | Discharge status: Home / Self Care | Payer: Self-pay | Source: Ambulatory Visit | Attending: Cardiology

## 2012-05-06 ENCOUNTER — Ambulatory Visit (HOSPITAL_COMMUNITY)
Admission: RE | Admit: 2012-05-06 | Discharge: 2012-05-06 | Disposition: A | Discharge status: Home / Self Care | Payer: Medicare Other | Source: Ambulatory Visit | Attending: Cardiology | Admitting: Cardiology

## 2012-05-06 ENCOUNTER — Ambulatory Visit (HOSPITAL_COMMUNITY): Payer: Medicare Other | Admitting: *Deleted

## 2012-05-06 DIAGNOSIS — I4892 Unspecified atrial flutter: Secondary | ICD-10-CM | POA: Insufficient documentation

## 2012-05-06 HISTORY — PX: CARDIOVERSION: SHX1299

## 2012-05-06 SURGERY — CARDIOVERSION
Anesthesia: Monitor Anesthesia Care

## 2012-05-06 MED ORDER — SODIUM CHLORIDE 0.9 % IV SOLN
INTRAVENOUS | Status: DC | PRN
  Administered 2012-05-06: 11:00:00 via INTRAVENOUS

## 2012-05-06 MED ORDER — PROPOFOL 10 MG/ML IV BOLUS
INTRAVENOUS | Status: DC | PRN
  Administered 2012-05-06: 50 mg via INTRAVENOUS

## 2012-05-06 MED ORDER — LIDOCAINE HCL (CARDIAC) 20 MG/ML IV SOLN
INTRAVENOUS | Status: DC | PRN
  Administered 2012-05-06: 40 mg via INTRAVENOUS

## 2012-05-06 NOTE — Transfer of Care (Signed)
Immediate Anesthesia Transfer of Care Note  Patient: Monica [name]  Procedure(s) Performed: Procedure(s) (LRB) with comments: CARDIOVERSION (N/A) - h/p in file drawer  Patient Location: PACU and Endoscopy Unit  Anesthesia Type: General  Level of Consciousness: awake, alert  and oriented  Airway & Oxygen Therapy: Patient Spontanous Breathing  Post-op Assessment: Report given to PACU RN  Post vital signs: stable  Complications: No apparent anesthesia complications

## 2012-05-06 NOTE — Anesthesia Postprocedure Evaluation (Signed)
  Anesthesia Post-op Note  Patient: Monica [name]  Procedure(s) Performed: Procedure(s) (LRB) with comments: CARDIOVERSION (N/A) - h/p in file drawer  Patient Location: PACU and Endoscopy Unit  Anesthesia Type: General  Level of Consciousness: awake  Airway and Oxygen Therapy: Patient Spontanous Breathing  Post-op Pain: none  Post-op Assessment: Post-op Vital signs reviewed, Patient's Cardiovascular Status Stable, Respiratory Function Stable, Patent Airway and No signs of Nausea or vomiting  Post-op Vital Signs: Reviewed and stable  Complications: No apparent anesthesia complications

## 2012-05-06 NOTE — Interval H&P Note (Signed)
History and Physical Interval Note:  05/06/2012 10:45 AM  Doretha Imus  has presented today for surgery, with the diagnosis of aflutter  The various methods of treatment have been discussed with the patient and family. After consideration of risks, benefits and other options for treatment, the patient has consented to  Procedure(s) (LRB) with comments: CARDIOVERSION (N/A) - h/p in file drawer as a surgical intervention .  The patient's history has been reviewed, patient examined, no change in status, stable for surgery.  I have reviewed the patient's chart and labs.  Questions were answered to the patient's satisfaction.     TURNER,TRACI R

## 2012-05-06 NOTE — Anesthesia Preprocedure Evaluation (Addendum)
Anesthesia Evaluation  Patient identified by MRN, date of birth, ID band Patient awake    Reviewed: Allergy & Precautions, H&P , NPO status , Patient's Chart, lab work & pertinent test results, reviewed documented beta blocker date and time   Airway Mallampati: II TM Distance: >3 FB     Dental  (+) Dental Advisory Given and Teeth Intact   Pulmonary  breath sounds clear to auscultation        Cardiovascular hypertension, Pt. on medications and Pt. on home beta blockers + dysrhythmias Atrial Fibrillation Rhythm:Irregular Rate:Normal     Neuro/Psych    GI/Hepatic   Endo/Other    Renal/GU Renal disease     Musculoskeletal   Abdominal (+)  Abdomen: soft. Bowel sounds: normal.  Peds  Hematology   Anesthesia Other Findings   Reproductive/Obstetrics                         Anesthesia Physical Anesthesia Plan  ASA: III  Anesthesia Plan: General   Post-op Pain Management:    Induction: Intravenous  Airway Management Planned: Mask  Additional Equipment:   Intra-op Plan:   Post-operative Plan:   Informed Consent: I have reviewed the patients History and Physical, chart, labs and discussed the procedure including the risks, benefits and alternatives for the proposed anesthesia with the patient or authorized representative who has indicated his/her understanding and acceptance.   Dental advisory given  Plan Discussed with: CRNA  Anesthesia Plan Comments:         Anesthesia Quick Evaluation

## 2012-05-06 NOTE — Preoperative (Signed)
Beta Blockers   Reason not to administer Beta Blockers:Not Applicable 

## 2012-05-06 NOTE — H&P (View-Only) (Signed)
Office Visit     Patient: Monica [name], Monica [name] Provider: Armanda Magic, MD  DOB: 12/25/1940 Age: 71 Y Sex: Female Date: 05/03/2012  Phone: [phone]   Address: [address] [place], [place]  Pcp: CANDACE SMITH       Subjective:     CC:    1. TT/ER f/u from last night-afib.        HPI:  General:  The patient presents today for followup of her atrial fibrillation, HTN, diastolic dysfunction and orthostatic hypotension. She says that this past Sunday she felt fatigued and weak and unable to do a lot of activity without getting out of breath. This reoccurred yesterday. She took her BP and she noticed that her heart rate was 98-102bpm and irregular and BP was 175/155mmHg. She went to the ER and she was told that she was in atrial fibrillation and gave her IV Cardizem and she went back into normal rhythm. She was told to continue her home medications. Since then she has continued to feel tired and weak. She says that she feels like her arms and legs are going to fall off if she does any activity. She occasionally notices a slight ache in her chest that is very brief. She denies any syncope but was presyncopal yesterday am when moving around. .        ROS:  See HPI, A twelve system review was perfomed at today's visit. For pertinent positives and negatives see HPI.       Medical History: paroxysmal atrial fibrillation chads score 2 but she has maintained NSR on Rhythmol until 11/21/10, Hypertension, NUC stress 2008 - low risk. , Echo - 2008 - LA 46mm, normal EF, normal wall thickness LV, mild MR, TR., Osteoporosis, R kidney stone on CT 6/11, L adrenal adenoma on CT 6/11, normal nuclear stress test 11/2010 Dr.Aileene Lanum.        Family History:        Social History:  General:  History of smoking cigarettes: Never smoked.  no Smoking.  no Tobacco Exposure.  no Alcohol.  Caffeine: yes, 1 serving daily, coffee.  no Recreational drug use.  Exercise: nothing structured.  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  Education: [employer].  Marital Status: single, widowed, 2006.  Children: 2 children both boys, grandchild x 1.  Religion: not at the moment.  Firearms: yes, gun safety is practiced.  Seat belt use: always.        Medications: Reclast 5 MG/100ML Solution had 8/13 once a yr for osteoporosis, Acetaminophen 500 MG Capsule 1 capsule as needed every 6 hrs as needed, Rythmol SR 325 MG Dr. Mayford Knife Capsule Extended Release 12 Hour 1 capsule Twice a day, Metoprolol Tartrate 100 MG Dr. Mayford Knife Tablet 1 tablet twice a day, Zantac 75 mg Tablet 1 tablet once a day, Omeprazole 20 MG Capsule Delayed Release 1 capsule twice daily, Pravastatin Sodium 20 MG Tablet 1 tablet Once a day, Warfarin Sodium 5 MG (DR Wylie Coon) Tablet 1/2 tablet once a day or as directecd, Medication List reviewed and reconciled with the patient       Allergies: Codeine (for allergy): nausea: Side Effects.       Objective:     Vitals: Wt 146.2, Wt change .2 lb, Ht 63, BMI 25.90, Pulse sitting 80, BP sitting 110/80, O BP Lying 134/84, O pulse lying 84, O BP sitting 117/76, O pulse sitting 83, O BP standing 118/79, O pulse standing 85.       Examination:  Cardiology, General:  GENERAL APPEARANCE: pleasant, NAD.  HEENT: unremarkable.  CAROTID UPSTROKE: normal, no bruit.  JVD: flat.  HEART SOUNDS: regular, normal S1, S2, no S3 or S4.  MURMUR: absent.  LUNGS: no rales or wheezes.  ABDOMEN: soft, non tender, positive bowel sounds, no masses felt.  EXTREMITIES: no leg edema.  PERIPHERAL PULSES: 2 plus bilateral.        Assessment:     Assessment:  1. Atrial fibrillation - 427.31 (Primary)  2. Benign hypertensive heart disease without heart failure - 402.10  3. Anticoagulated - V58.61  4. Diastolic dysfunction - 429.9    Plan:     1. Atrial fibrillation Continue Rythmol SR Capsule Extended Release 12 Hour, 325 MG Dr. Mayford Knife, 1 capsule, Orally, Twice a day ; Continue  Metoprolol Tartrate Tablet, 100 MG Dr. Mayford Knife, 1 tablet, Orally, twice a day ; Continue Warfarin Sodium Tablet, 5 MG (DR Jeancarlos Marchena), 1/2 tablet, Orally, once a day or as directecd .  Diagnostic Imaging:EKG atrial flutter, Corson,Danielle 05/03/2012 03:21:01 PM > Marice Guidone M 05/03/2012 03:34:29 PM >  She currently is in atrial flutter with controlled heart rate. I suspect that atrial flutter is what is making her feel fatigued with loss of atrial kick. The options for treatment are plan for cardioversion or referral to EP for possible ablation of atypical flutter. Since she broken through on Rhythmol I think that consideration of ablation may be the best option. I have discussed this with Dr. Ladona Ridgel today. He recommends going ahead with cardioversion on 9/20. Her INR has been therapeutic for the past few months. He then recommends that we set her up with Dr. Johney Frame for possible afib/flutter ablation. We will go ahead with cardioversion on 9/20 and then OV with Dr. Johney Frame. Patient understands plan and agrees to proceed.       2. Benign hypertensive heart disease without heart failure Continue Metoprolol Tartrate Tablet, 100 MG Dr. Mayford Knife, 1 tablet, Orally, twice a day .       3. Diastolic dysfunction Continue Metoprolol Tartrate Tablet, 100 MG Dr. Mayford Knife, 1 tablet, Orally, twice a day .        Immunizations:        Labs:        Procedure Codes: 98119 EKG I AND R       Preventive:         Follow Up: 2 Weeks with TT (Reason: aflutter)      Provider: Armanda Magic, MD  Patient: Monica [name], Monica [name] DOB: 03-13-1941 Date: 05/03/2012

## 2012-05-06 NOTE — CV Procedure (Signed)
Electrical Cardioversion Procedure Note Monica [name] [id number] [birth year]  Procedure: Electrical Cardioversion Indications:  Atria Flutter  Time Out: Verified patient identification, verified procedure,medications/allergies/relevent history reviewed, required imaging and test results available.  Performed  Procedure Details  The patient was NPO after midnight. Anesthesia was administered at the beside  by Dr.Fitzgerald with 50mg  of propofol and 40mg  of Lidocaine.  Cardioversion was done with synchronized biphasic defibrillation with AP pads with 150watts.  The patient converted to normal sinus rhythm. The patient tolerated the procedure well   IMPRESSION:  Successful cardioversion of atrial flutter    TURNER,TRACI R 05/06/2012, 11:02 AM

## 2012-05-10 ENCOUNTER — Encounter (HOSPITAL_COMMUNITY): Payer: Self-pay | Admitting: Cardiology

## 2012-05-13 ENCOUNTER — Encounter: Payer: Self-pay | Admitting: *Deleted

## 2012-05-13 ENCOUNTER — Ambulatory Visit (INDEPENDENT_AMBULATORY_CARE_PROVIDER_SITE_OTHER): Payer: Medicare Other | Admitting: Internal Medicine

## 2012-05-13 ENCOUNTER — Encounter: Payer: Self-pay | Admitting: Internal Medicine

## 2012-05-13 ENCOUNTER — Other Ambulatory Visit: Payer: Self-pay | Admitting: *Deleted

## 2012-05-13 VITALS — BP 163/77 | HR 59 | Ht 63.0 in | Wt 148.0 lb

## 2012-05-13 DIAGNOSIS — I498 Other specified cardiac arrhythmias: Secondary | ICD-10-CM

## 2012-05-13 DIAGNOSIS — R001 Bradycardia, unspecified: Secondary | ICD-10-CM

## 2012-05-13 DIAGNOSIS — I4892 Unspecified atrial flutter: Secondary | ICD-10-CM

## 2012-05-13 DIAGNOSIS — Z01812 Encounter for preprocedural laboratory examination: Secondary | ICD-10-CM

## 2012-05-13 DIAGNOSIS — I4891 Unspecified atrial fibrillation: Secondary | ICD-10-CM

## 2012-05-13 DIAGNOSIS — I10 Essential (primary) hypertension: Secondary | ICD-10-CM

## 2012-05-13 NOTE — Patient Instructions (Addendum)

## 2012-05-16 ENCOUNTER — Encounter: Payer: Self-pay | Admitting: Internal Medicine

## 2012-05-16 DIAGNOSIS — I4892 Unspecified atrial flutter: Secondary | ICD-10-CM | POA: Insufficient documentation

## 2012-05-16 HISTORY — DX: Unspecified atrial flutter: I48.92

## 2012-05-16 NOTE — Assessment & Plan Note (Signed)
As above.

## 2012-05-16 NOTE — Assessment & Plan Note (Signed)
Asymptomatic  No changes today 

## 2012-05-16 NOTE — Assessment & Plan Note (Signed)
The patient has symptomatic paroxysmal atrial fibrillation.  She has also recently been documented to have typical appearing atrial flutter.  She has failed medical therapy with metoprolol and rhythmol. Therapeutic strategies for afib and atrial flutter including medicine and ablation were discussed in detail with the patient today. Risk, benefits, and alternatives to EP study and radiofrequency ablation were also discussed in detail today. These risks include but are not limited to stroke, bleeding, vascular damage, tamponade, perforation, damage to the esophagus, lungs, and other structures, pulmonary vein stenosis, worsening renal function, and death. The patient understands these risk and wishes to proceed.  We will therefore proceed with catheter ablation at the next available time.

## 2012-05-16 NOTE — Progress Notes (Signed)
Primary Care Physician: Allean Found, MD Referring Physician:  Dr Debara Pickett is a 71 y.o. female with a h/o atrial fibrillation and hypertension who presents today for EP consultation.  She reports initially being diagnosed with atrial fibrillation 08/2006 after presenting with tachypalpitations.  She was placed on metoprolol and did well for several months.  She then developed recurrent atrial fibrillation and was placed on rhythmol in May of 2008 by Dr Ladona Ridgel.  Since that time, she has continued to have atrial fibrillation every 6 months or so.  She reports symptoms of fatigue and weakness with her afib.  She is unaware of triggers/ precipitants.  She recently was found also to have typical appearing atrial flutter requiring cardioversion 05/06/12.  She has been in sinus rhythm since that time. Today, she denies symptoms of  chest pain, shortness of breath, orthopnea, PND, lower extremity edema, dizziness, presyncope, syncope, or neurologic sequela. The patient is tolerating medications without difficulties and is otherwise without complaint today.   Past Medical History  Diagnosis Date  . Hypertension   . Normal nuclear stress test 2008    low risk  . Osteoporosis   . Kidney stone 6/11    rt. on ct 6/11  . 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 Paroxysmal atrial fibrillation    Past Surgical History  Procedure Date  . Cholecystectomy 1989  . Cesarean section 1978-1979    x-2  . Cardioversion 11/10/2011    Procedure: CARDIOVERSION;  Surgeon: Quintella Reichert, MD;  Location: [facility] OR;  Service: Cardiovascular;  Laterality: N/A;  . Cardioversion 05/06/2012    Procedure: CARDIOVERSION;  Surgeon: Quintella Reichert, MD;  Location: [facility] ENDOSCOPY;  Service: Cardiovascular;  Laterality: N/A;  h/p in file drawer    Current Outpatient Prescriptions  Medication Sig Dispense Refill  . acetaminophen (TYLENOL) 500 MG tablet Take 500 mg by mouth every 6 (six) hours as needed. For  pain      . calcium carbonate 1250 MG capsule Take 1,250 mg by mouth 2 (two) times daily with a meal.      . cholecalciferol (VITAMIN D) 1000 UNITS tablet Take 1,000 Units by mouth every evening.       . metoprolol (LOPRESSOR) 100 MG tablet Take 100 mg by mouth 2 (two) times daily.        Marland Kitchen omeprazole (PRILOSEC) 20 MG capsule Take 20 mg by mouth 2 (two) times daily.      . pravastatin (PRAVACHOL) 20 MG tablet Take 20 mg by mouth daily.      . propafenone (RYTHMOL SR) 325 MG 12 hr capsule Take 325 mg by mouth 2 (two) times daily.        . ranitidine (ZANTAC) 150 MG tablet Take 150 mg by mouth daily.       Marland Kitchen warfarin (COUMADIN) 5 MG tablet Take 2.5 mg by mouth every evening.       . zoledronic acid (RECLAST) 5 MG/100ML SOLN As directed once a yr osteoporosis        Allergies  Allergen Reactions  . Codeine Nausea And Vomiting    History   Social History  . Marital Status: Widowed    Spouse Name: N/A    Number of Children: N/A  . Years of Education: N/A   Occupational History  . Not on file.   Social History Main Topics  . Smoking status: Never Smoker   . Smokeless tobacco: Not on file  .  Alcohol Use: No  . Drug Use: No  . Sexually Active: Not on file   Other Topics Concern  . 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    Family History  Problem Relation Age of Onset  . Heart attack Father   . Hip fracture Mother     complication  . Lung cancer Brother   . Heart attack Brother     massive  . Other Brother     mva  . Thyroid cancer Sister   . Atrial fibrillation Sister   . Ovarian cancer Sister     surviver    ROS- All systems are reviewed and negative except as per the HPI above  Physical Exam: Filed Vitals:   05/13/12 1236  BP: 163/77  Pulse: 59  Height: 5\' 3"  (1.6 m)  Weight: 148 lb (67.132 kg)  SpO2: 100%    GEN- The patient is  well appearing, alert and oriented x 3 today.   Head- normocephalic, atraumatic Eyes-  Sclera clear, conjunctiva pink Ears- hearing intact Oropharynx- clear Neck- supple, no JVP Lymph- no cervical lymphadenopathy Lungs- Clear to ausculation bilaterally, normal work of breathing Heart- Regular rate and rhythm, no murmurs, rubs or gallops, PMI not laterally displaced GI- soft, NT, ND, + BS Extremities- no clubbing, cyanosis, or edema MS- no significant deformity or atrophy Skin- no rash or lesion Psych- euthymic mood, full affect Neuro- strength and sensation are intact  EKG today reveals sinus rhythm 59 bpm, PR 190, QRS 102, Qtc 447, otherwise normal ekg Echo 12/16/11- EF 61%, mild MR, LA 31mm  Assessment and Plan:

## 2012-05-16 NOTE — Assessment & Plan Note (Signed)
Stable No change required today  

## 2012-05-18 ENCOUNTER — Institutional Professional Consult (permissible substitution): Payer: Medicare Other | Admitting: Internal Medicine

## 2012-05-26 ENCOUNTER — Encounter (HOSPITAL_COMMUNITY): Payer: Self-pay | Admitting: Pharmacy Technician

## 2012-05-31 ENCOUNTER — Other Ambulatory Visit (INDEPENDENT_AMBULATORY_CARE_PROVIDER_SITE_OTHER): Payer: Medicare Other

## 2012-05-31 DIAGNOSIS — Z01812 Encounter for preprocedural laboratory examination: Secondary | ICD-10-CM

## 2012-05-31 DIAGNOSIS — I4891 Unspecified atrial fibrillation: Secondary | ICD-10-CM

## 2012-05-31 LAB — CBC WITH DIFFERENTIAL/PLATELET
Basophils Relative: 1 % (ref 0.0–3.0)
Eosinophils Relative: 1.9 % (ref 0.0–5.0)
HCT: 40.4 % (ref 36.0–46.0)
Hemoglobin: 12.8 g/dL (ref 12.0–15.0)
Lymphs Abs: 1.7 10*3/uL (ref 0.7–4.0)
Monocytes Relative: 8.7 % (ref 3.0–12.0)
Neutro Abs: 2.7 10*3/uL (ref 1.4–7.7)
RBC: 4.66 Mil/uL (ref 3.87–5.11)
RDW: 15.9 % — ABNORMAL HIGH (ref 11.5–14.6)
WBC: 4.9 10*3/uL (ref 4.5–10.5)

## 2012-05-31 LAB — PROTIME-INR: INR: 2.9 ratio — ABNORMAL HIGH (ref 0.8–1.0)

## 2012-05-31 LAB — BASIC METABOLIC PANEL
BUN: 18 mg/dL (ref 6–23)
CO2: 28 mEq/L (ref 19–32)
Calcium: 9 mg/dL (ref 8.4–10.5)
Chloride: 101 mEq/L (ref 96–112)
Creatinine, Ser: 1 mg/dL (ref 0.4–1.2)
Glucose, Bld: 89 mg/dL (ref 70–99)

## 2012-06-01 ENCOUNTER — Other Ambulatory Visit: Payer: Self-pay | Admitting: Interventional Cardiology

## 2012-06-06 ENCOUNTER — Ambulatory Visit (HOSPITAL_COMMUNITY)
Admission: RE | Admit: 2012-06-06 | Discharge: 2012-06-06 | Disposition: A | Discharge status: Home / Self Care | Payer: Medicare Other | Source: Ambulatory Visit | Attending: Interventional Cardiology | Admitting: Interventional Cardiology

## 2012-06-06 ENCOUNTER — Encounter (HOSPITAL_COMMUNITY): Payer: Self-pay | Admitting: *Deleted

## 2012-06-06 ENCOUNTER — Encounter (HOSPITAL_COMMUNITY)
Admission: RE | Disposition: A | Discharge status: Home / Self Care | Payer: Self-pay | Source: Ambulatory Visit | Attending: Interventional Cardiology

## 2012-06-06 DIAGNOSIS — I059 Rheumatic mitral valve disease, unspecified: Secondary | ICD-10-CM | POA: Insufficient documentation

## 2012-06-06 HISTORY — DX: Pure hypercholesterolemia, unspecified: E78.00

## 2012-06-06 HISTORY — PX: TEE WITHOUT CARDIOVERSION: SHX5443

## 2012-06-06 HISTORY — DX: Gastro-esophageal reflux disease without esophagitis: K21.9

## 2012-06-06 SURGERY — ECHOCARDIOGRAM, TRANSESOPHAGEAL
Anesthesia: Moderate Sedation

## 2012-06-06 MED ORDER — MIDAZOLAM HCL 10 MG/2ML IJ SOLN
INTRAMUSCULAR | Status: DC | PRN
  Administered 2012-06-06 (×2): 2 mg via INTRAVENOUS

## 2012-06-06 MED ORDER — FENTANYL CITRATE 0.05 MG/ML IJ SOLN
INTRAMUSCULAR | Status: DC | PRN
  Administered 2012-06-06 (×2): 25 ug via INTRAVENOUS

## 2012-06-06 MED ORDER — MIDAZOLAM HCL 5 MG/ML IJ SOLN
INTRAMUSCULAR | Status: AC
  Filled 2012-06-06: qty 2

## 2012-06-06 MED ORDER — LIDOCAINE VISCOUS 2 % MT SOLN
OROMUCOSAL | Status: DC | PRN
  Administered 2012-06-06: 10 mL via OROMUCOSAL

## 2012-06-06 MED ORDER — SODIUM CHLORIDE 0.9 % IV SOLN
INTRAVENOUS | Status: DC
  Administered 2012-06-06: 09:00:00 via INTRAVENOUS

## 2012-06-06 MED ORDER — FENTANYL CITRATE 0.05 MG/ML IJ SOLN
INTRAMUSCULAR | Status: AC
  Filled 2012-06-06: qty 2

## 2012-06-06 MED ORDER — LIDOCAINE VISCOUS 2 % MT SOLN
OROMUCOSAL | Status: AC
  Filled 2012-06-06: qty 15

## 2012-06-06 NOTE — Progress Notes (Signed)
Echocardiogram Echocardiogram Transesophageal has been performed.  Wilmer Santillo 06/06/2012, 11:38 AM

## 2012-06-06 NOTE — H&P (Signed)
  Date of Initial H&P: 05/13/12  History reviewed, patient examined, no change in status, stable for surgery.  Questions about TEE answered.  SHe wants to proceed.

## 2012-06-06 NOTE — CV Procedure (Signed)
TEE performed.  No complications.  Normal LV function. Mild to moderate MR.  No LA/LAA thrombus.  No significant aortic atherosclerosis.  Aortic sclerosis.  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 See [facility] for full report.

## 2012-06-07 ENCOUNTER — Ambulatory Visit (HOSPITAL_COMMUNITY): Payer: Medicare Other | Admitting: Anesthesiology

## 2012-06-07 ENCOUNTER — Encounter (HOSPITAL_COMMUNITY): Payer: Self-pay | Admitting: Anesthesiology

## 2012-06-07 ENCOUNTER — Encounter (HOSPITAL_COMMUNITY): Payer: Self-pay | Admitting: *Deleted

## 2012-06-07 ENCOUNTER — Encounter (HOSPITAL_COMMUNITY)
Admission: RE | Disposition: A | Discharge status: Home / Self Care | Payer: Self-pay | Source: Ambulatory Visit | Attending: Internal Medicine

## 2012-06-07 ENCOUNTER — Ambulatory Visit (HOSPITAL_COMMUNITY)
Admission: RE | Admit: 2012-06-07 | Discharge: 2012-06-08 | Disposition: A | Discharge status: Home / Self Care | Payer: Medicare Other | Source: Ambulatory Visit | Attending: Internal Medicine | Admitting: Internal Medicine

## 2012-06-07 DIAGNOSIS — I4892 Unspecified atrial flutter: Secondary | ICD-10-CM

## 2012-06-07 DIAGNOSIS — Z79899 Other long term (current) drug therapy: Secondary | ICD-10-CM | POA: Insufficient documentation

## 2012-06-07 DIAGNOSIS — R001 Bradycardia, unspecified: Secondary | ICD-10-CM | POA: Diagnosis present

## 2012-06-07 DIAGNOSIS — D3500 Benign neoplasm of unspecified adrenal gland: Secondary | ICD-10-CM | POA: Insufficient documentation

## 2012-06-07 DIAGNOSIS — I10 Essential (primary) hypertension: Secondary | ICD-10-CM | POA: Diagnosis present

## 2012-06-07 DIAGNOSIS — I4891 Unspecified atrial fibrillation: Secondary | ICD-10-CM

## 2012-06-07 DIAGNOSIS — R9389 Abnormal findings on diagnostic imaging of other specified body structures: Secondary | ICD-10-CM

## 2012-06-07 DIAGNOSIS — Z7901 Long term (current) use of anticoagulants: Secondary | ICD-10-CM | POA: Insufficient documentation

## 2012-06-07 DIAGNOSIS — M810 Age-related osteoporosis without current pathological fracture: Secondary | ICD-10-CM | POA: Insufficient documentation

## 2012-06-07 DIAGNOSIS — Z01810 Encounter for preprocedural cardiovascular examination: Secondary | ICD-10-CM | POA: Insufficient documentation

## 2012-06-07 HISTORY — PX: ATRIAL FIBRILLATION ABLATION: SHX5456

## 2012-06-07 LAB — PROTIME-INR
INR: 3.01 — ABNORMAL HIGH (ref 0.00–1.49)
Prothrombin Time: 29.6 seconds — ABNORMAL HIGH (ref 11.6–15.2)

## 2012-06-07 LAB — POCT ACTIVATED CLOTTING TIME
Activated Clotting Time: 369 seconds
Activated Clotting Time: 354 seconds

## 2012-06-07 SURGERY — ATRIAL FIBRILLATION ABLATION
Anesthesia: General

## 2012-06-07 MED ORDER — METOPROLOL TARTRATE 25 MG PO TABS
25.0000 mg | ORAL_TABLET | Freq: Two times a day (BID) | ORAL | Status: DC
  Administered 2012-06-07 – 2012-06-08 (×3): 25 mg via ORAL
  Filled 2012-06-07 (×4): qty 1

## 2012-06-07 MED ORDER — PROTAMINE SULFATE 10 MG/ML IV SOLN
INTRAVENOUS | Status: DC | PRN
  Administered 2012-06-07 (×2): 20 mg via INTRAVENOUS

## 2012-06-07 MED ORDER — WARFARIN - PHYSICIAN DOSING INPATIENT
Freq: Every day | Status: DC
  Administered 2012-06-07: 18:00:00

## 2012-06-07 MED ORDER — SODIUM CHLORIDE 0.9 % IJ SOLN: 3.0000 mL | INTRAMUSCULAR | Status: DC | PRN

## 2012-06-07 MED ORDER — PROPAFENONE HCL ER 325 MG PO CP12
325.0000 mg | ORAL_CAPSULE | Freq: Two times a day (BID) | ORAL | Status: DC
  Administered 2012-06-07 – 2012-06-08 (×2): 325 mg via ORAL
  Filled 2012-06-07 (×4): qty 1

## 2012-06-07 MED ORDER — ACETAMINOPHEN 325 MG PO TABS
650.0000 mg | ORAL_TABLET | ORAL | Status: DC | PRN
  Administered 2012-06-08: 650 mg via ORAL
  Filled 2012-06-07: qty 2

## 2012-06-07 MED ORDER — MIDAZOLAM HCL 5 MG/5ML IJ SOLN
INTRAMUSCULAR | Status: DC | PRN
  Administered 2012-06-07: 1 mg via INTRAVENOUS

## 2012-06-07 MED ORDER — HEPARIN SODIUM (PORCINE) 1000 UNIT/ML IJ SOLN
INTRAMUSCULAR | Status: DC | PRN
  Administered 2012-06-07: 8000 [IU] via INTRAVENOUS

## 2012-06-07 MED ORDER — GLYCOPYRROLATE 0.2 MG/ML IJ SOLN
INTRAMUSCULAR | Status: DC | PRN
  Administered 2012-06-07: 0.3 mg via INTRAVENOUS

## 2012-06-07 MED ORDER — BUPIVACAINE HCL (PF) 0.25 % IJ SOLN
INTRAMUSCULAR | Status: AC
  Filled 2012-06-07: qty 30

## 2012-06-07 MED ORDER — ONDANSETRON HCL 4 MG/2ML IJ SOLN: 4.0000 mg | Freq: Four times a day (QID) | INTRAMUSCULAR | Status: DC | PRN

## 2012-06-07 MED ORDER — NEOSTIGMINE METHYLSULFATE 1 MG/ML IJ SOLN
INTRAMUSCULAR | Status: DC | PRN
  Administered 2012-06-07: 2 mg via INTRAVENOUS

## 2012-06-07 MED ORDER — FENTANYL CITRATE 0.05 MG/ML IJ SOLN
INTRAMUSCULAR | Status: DC | PRN
  Administered 2012-06-07: 50 ug via INTRAVENOUS
  Administered 2012-06-07: 100 ug via INTRAVENOUS
  Administered 2012-06-07: 50 ug via INTRAVENOUS

## 2012-06-07 MED ORDER — METOPROLOL TARTRATE 25 MG PO TABS: 25.0000 mg | ORAL_TABLET | Freq: Two times a day (BID) | ORAL | Status: DC

## 2012-06-07 MED ORDER — SODIUM CHLORIDE 0.9 % IJ SOLN
3.0000 mL | Freq: Two times a day (BID) | INTRAMUSCULAR | Status: DC
  Administered 2012-06-07 – 2012-06-08 (×3): 3 mL via INTRAVENOUS

## 2012-06-07 MED ORDER — SODIUM CHLORIDE 0.9 % IV SOLN: 250.0000 mL | INTRAVENOUS | Status: DC | PRN

## 2012-06-07 MED ORDER — ONDANSETRON HCL 4 MG/2ML IJ SOLN
INTRAMUSCULAR | Status: DC | PRN
  Administered 2012-06-07: 4 mg via INTRAVENOUS

## 2012-06-07 MED ORDER — ROCURONIUM BROMIDE 100 MG/10ML IV SOLN
INTRAVENOUS | Status: DC | PRN
  Administered 2012-06-07: 40 mg via INTRAVENOUS

## 2012-06-07 MED ORDER — PANTOPRAZOLE SODIUM 40 MG PO TBEC
40.0000 mg | DELAYED_RELEASE_TABLET | Freq: Every day | ORAL | Status: DC
  Administered 2012-06-07: 40 mg via ORAL
  Filled 2012-06-07: qty 1

## 2012-06-07 MED ORDER — HYDROCODONE-ACETAMINOPHEN 5-325 MG PO TABS: 1.0000 | ORAL_TABLET | ORAL | Status: DC | PRN

## 2012-06-07 MED ORDER — SODIUM CHLORIDE 0.9 % IV SOLN
INTRAVENOUS | Status: DC | PRN
  Administered 2012-06-07 (×2): via INTRAVENOUS

## 2012-06-07 MED ORDER — WARFARIN SODIUM 2.5 MG PO TABS
2.5000 mg | ORAL_TABLET | Freq: Every day | ORAL | Status: DC
  Administered 2012-06-07: 2.5 mg via ORAL
  Filled 2012-06-07 (×2): qty 1

## 2012-06-07 MED ORDER — LIDOCAINE HCL (CARDIAC) 20 MG/ML IV SOLN
INTRAVENOUS | Status: DC | PRN
  Administered 2012-06-07: 20 mg via INTRAVENOUS

## 2012-06-07 MED ORDER — PROPOFOL 10 MG/ML IV BOLUS
INTRAVENOUS | Status: DC | PRN
  Administered 2012-06-07: 110 mg via INTRAVENOUS

## 2012-06-07 MED ORDER — HEPARIN SODIUM (PORCINE) 1000 UNIT/ML IJ SOLN
INTRAMUSCULAR | Status: AC
  Filled 2012-06-07: qty 1

## 2012-06-07 NOTE — Preoperative (Signed)
Beta Blockers   Reason not to administer Beta Blockers:Not Applicable, took last pm at 2130

## 2012-06-07 NOTE — Transfer of Care (Signed)
Immediate Anesthesia Transfer of Care Note  Patient: Monica [name]  Procedure(s) Performed: Procedure(s) (LRB) with comments: ATRIAL FIBRILLATION ABLATION (N/A)  Patient Location: PACU and Cath Lab  Anesthesia Type: General  Level of Consciousness: awake, oriented and patient cooperative  Airway & Oxygen Therapy: Patient Spontanous Breathing and Patient connected to nasal cannula oxygen  Post-op Assessment: Report given to PACU RN and Post -op Vital signs reviewed and stable  Post vital signs: Reviewed and stable  Complications: No apparent anesthesia complications

## 2012-06-07 NOTE — Progress Notes (Signed)
Utilization Review Completed.Monica [name] T10/22/2013   

## 2012-06-07 NOTE — Interval H&P Note (Signed)
History and Physical Interval Note:  06/07/2012 7:17 AM  Monica [name]  has presented today for surgery, with the diagnosis of afib  The various methods of treatment have been discussed with the patient and family. After consideration of risks, benefits and other options for treatment, the patient has consented to  Procedure(s) (LRB) with comments: ATRIAL FIBRILLATION ABLATION (N/A) as a surgical intervention .  The patient's history has been reviewed, patient examined, no change in status, stable for surgery.  I have reviewed the patient's chart and labs.  Questions were answered to the patient's satisfaction.     Hillis Range

## 2012-06-07 NOTE — Anesthesia Postprocedure Evaluation (Signed)
  Anesthesia Post-op Note  Patient: Monica [name]  Procedure(s) Performed: Procedure(s) (LRB) with comments: ATRIAL FIBRILLATION ABLATION (N/A)  Patient Location: PACU  Anesthesia Type: General  Level of Consciousness: awake, alert  and oriented  Airway and Oxygen Therapy: Patient Spontanous Breathing and Patient connected to nasal cannula oxygen  Post-op Pain: mild  Post-op Assessment: Post-op Vital signs reviewed  Post-op Vital Signs: stable  Complications: No apparent anesthesia complications

## 2012-06-07 NOTE — Op Note (Signed)
SURGEON:  Hillis Range, MD  PREPROCEDURE DIAGNOSES: 1. Paroxysmal atrial fibrillation. 2. Typical appearing atrial flutter  POSTPROCEDURE DIAGNOSES: 1. Paroxysmal  atrial fibrillation. 2. Typical appearing atrial flutter  PROCEDURES: 1. Comprehensive electrophysiologic study. 2. Coronary sinus pacing and recording. 3. Three-dimensional mapping of atrial fibrillation with additional mapping and ablation of atrial flutter (a second discrete focus) 4. Ablation of atrial fibrillation with additional mapping and ablation of atrial flutter (a second discrete focus) 5. Arterial blood pressure monitoring. 6. Intracardiac echocardiography. 7. Transseptal puncture of an intact septum. 8. Rotational Angiography with processing at an independent workstation  INTRODUCTION:  Monica [name] is a 71 y.o. female with a history of paroxysmal atrial fibrillation and typical appearing atrial flutter who now presents for EP study and radiofrequency ablation.   The patient has symptomatic paroxysmal atrial fibrillation. She has also recently been documented to have typical appearing atrial flutter. She has failed medical therapy with metoprolol and rhythmol.  The patient therefore presents today for catheter ablation of atrial fibrillation and atrial flutter.  DESCRIPTION OF PROCEDURE:  Informed written consent was obtained, and the patient was brought to the electrophysiology lab in a fasting state.  The patient was adequately sedated with intravenous medications as outlined in the anesthesia report.  The patient's left and right groins were prepped and draped in the usual sterile fashion by the EP lab staff.  Using a percutaneous Seldinger technique, two 7-French and one 8-French hemostasis sheaths were placed into the right common femoral vein.  A 4- [place] hemostasis sheath was placed in the right common femoral artery for blood pressure monitoring.  An 11-French hemostasis sheath was placed into the left  common femoral vein.   3 Dimensional Rotational Angiography: A 5 french pigtail catheter was introduced through the right common femoral vein and advanced into the inferior venocava.  3 demential rotational angiography was then performed by power injection of 100cc of nonionic contrast.  Reprocessing at an independent work station was then performed.  This demonstrated a very large sized left atrium with 4 separate pulmonary veins which were also moderate in size.  There were no anomalous veins or significant abnormalities.  A 3 dimensional rendering of the left atrium was then merged using [employer] onto the WellPoint system and registered with intracardiac echo (see below).  The pigtail catheter was then removed.  Catheter Placement:  A 7-French Biosense Webster Decapolar coronary sinus catheter was introduced through the right common femoral vein and advanced into the coronary sinus for recording and pacing from this location.  A 6-French quadripolar Josephson catheter was introduced through the right common femoral vein and advanced into the right ventricle for recording and pacing.  This catheter was then pulled back to the His bundle location.    Initial Measurements: The patient presented to the electrophysiology lab in sinus rhythm.  Her PR interval measured 228 with a QRS duringation of 107 and a QT interval of .  The AH interval measured 149 and the HV interval measured 44 msec.     Intracardiac Echocardiography: A 10-French Biosense Webster AcuNav intracardiac echocardiography catheter was introduced through the left common femoral vein and advanced into the right atrium. Intracardiac echocardiography was performed of the left atrium, and a three-dimensional anatomical rendering of the left atrium was performed using CARTO sound technology.  The patient was noted to have a moderate sized left atrium.  The interatrial septum was prominent but not aneurysmal.  There was a  small PFO.  All 4 pulmonary veins were visualized and noted to have separate ostia.  The pulmonary veins were moderate in size.  The left atrial appendage was visualized and did not reveal thrombus.   There was no evidence of pulmonary vein stenosis.   Transseptal Puncture: The middle right common femoral vein sheath was exchanged for an 8.5 [place] SL2 transseptal sheath and transseptal access was achieved in a standard fashion using a Brockenbrough needle under biplane fluoroscopy with intracardiac echocardiography confirmation of the transseptal puncture.  Once transseptal access had been achieved, heparin was administered intravenously and intra- arterially in order to maintain an ACT of greater than 350 seconds throughout the procedure.    3D Mapping and Ablation: The His bundle catheter was removed and in its place a 3.5 mm Biosense Webster E[employer] ablation catheter was advanced into the right atrium.  The transseptal sheath was pulled back into the IVC over a guidewire.  The ablation catheter was advanced across the transseptal hole using the wire as a guide.  The transseptal sheath was then re-advanced over the guidewire into the left atrium.  A duodecapolar Biosense Webster circular mapping catheter was introduced through the transseptal sheath and positioned over the mouth of all 4 pulmonary veins.  Three-dimensional electroanatomical mapping was performed using CARTO technology.  This demonstrated electrical activity within all four pulmonary veins at baseline. The patient underwent successful sequential electrical isolation and anatomical encircling of all four pulmonary veins using radiofrequency current with a circular mapping catheter as a guide.  The patient converted to right atrial flutter during ablation with a CL of .  The coronary sinus activation was proximal to distal.  Entrainment was performed from the left atrium which revealed a long post pacing interval when  compared to the tachycardia cycle length. The ablation catheter was then pulled back into the right atrial and positioned along the cavo-tricuspid isthmus.  Mapping along the atrial side of the isthmus was performed.  This demonstrated a standard isthmus.  A series of radiofrequency applications were then delivered along the isthmus.  The tachycardia slowed and then terminated during ablation.  Complete bidirectional cavotricuspid isthmus block was achieved as confirmed by differential atrial pacing from the low lateral right atrium.  A stimulus to earliest atrial activation across the isthmus measured 179 msec bi-directionally.  The patient was observe without return of conduction through the isthmus.  Measurements Following Ablation: In sinus rhythm following his ablation, his RR interval was 1167, with PR , QRS 93 msec, and Qtc 504 msec.  Following ablation the AH interval measured 117 msec with an HV interval of 40 msec. Ventricular pacing was performed, which revealed VA dissociation.  Rapid atrial pacing was performed, which revealed an AV Wenckebach cycle length of 480 msec.  Atypical atrial flutter was induced with RAP at however this was nonsustained and could not be mapped or ablated today. The procedure was therefore considered completed.  All catheters were removed, and the sheaths were aspirated and flushed.  The patient was transferred to the recovery area for sheath removal per protocol.  A limited bedside transthoracic echocardiogram revealed no pericardial effusion.  There were no early apparent complications.  CONCLUSIONS: 1. Sinus rhythm upon presentation.   2. Rotational Angiography reveals a large sized left atrium with four separate pulmonary veins without evidence of pulmonary vein stenosis. 3. Successful electrical isolation and anatomical encircling of all four pulmonary veins with radiofrequency current. 4. Cavo-tricuspid isthmus ablation was performed with  complete bidirectional isthmus  block achieved.  5. No inducible sustained arrhythmias following ablation  6. No early apparent complications.   Fayrene Fearing Kunal Levario,MD 10:33 AM 06/07/2012

## 2012-06-07 NOTE — H&P (View-Only) (Signed)
 Primary Care Physician: SMITH,CANDACE THIELE, MD Referring Physician:  Dr Turner   Monica [name] is a 71 y.o. female with a h/o atrial fibrillation and hypertension who presents today for EP consultation.  She reports initially being diagnosed with atrial fibrillation 08/2006 after presenting with tachypalpitations.  She was placed on metoprolol and did well for several months.  She then developed recurrent atrial fibrillation and was placed on rhythmol in May of 2008 by Dr Taylor.  Since that time, she has continued to have atrial fibrillation every 6 months or so.  She reports symptoms of fatigue and weakness with her afib.  She is unaware of triggers/ precipitants.  She recently was found also to have typical appearing atrial flutter requiring cardioversion 05/06/12.  She has been in sinus rhythm since that time. Today, she denies symptoms of  chest pain, shortness of breath, orthopnea, PND, lower extremity edema, dizziness, presyncope, syncope, or neurologic sequela. The patient is tolerating medications without difficulties and is otherwise without complaint today.   Past Medical History  Diagnosis Date  . Hypertension   . Normal nuclear stress test 2008    low risk  . Osteoporosis   . Kidney stone 6/11    rt. on ct 6/11  . 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 Paroxysmal atrial fibrillation    Past Surgical History  Procedure Date  . Cholecystectomy 1989  . Cesarean section 1978-1979    x-2  . Cardioversion 11/10/2011    Procedure: CARDIOVERSION;  Surgeon: Traci R Turner, MD;  Location: [facility] OR;  Service: Cardiovascular;  Laterality: N/A;  . Cardioversion 05/06/2012    Procedure: CARDIOVERSION;  Surgeon: Traci R Turner, MD;  Location: [facility] ENDOSCOPY;  Service: Cardiovascular;  Laterality: N/A;  h/p in file drawer    Current Outpatient Prescriptions  Medication Sig Dispense Refill  . acetaminophen (TYLENOL) 500 MG tablet Take 500 mg by mouth every 6 (six) hours as needed. For  pain      . calcium carbonate 1250 MG capsule Take 1,250 mg by mouth 2 (two) times daily with a meal.      . cholecalciferol (VITAMIN D) 1000 UNITS tablet Take 1,000 Units by mouth every evening.       . metoprolol (LOPRESSOR) 100 MG tablet Take 100 mg by mouth 2 (two) times daily.        . omeprazole (PRILOSEC) 20 MG capsule Take 20 mg by mouth 2 (two) times daily.      . pravastatin (PRAVACHOL) 20 MG tablet Take 20 mg by mouth daily.      . propafenone (RYTHMOL SR) 325 MG 12 hr capsule Take 325 mg by mouth 2 (two) times daily.        . ranitidine (ZANTAC) 150 MG tablet Take 150 mg by mouth daily.       . warfarin (COUMADIN) 5 MG tablet Take 2.5 mg by mouth every evening.       . zoledronic acid (RECLAST) 5 MG/100ML SOLN As directed once a yr osteoporosis        Allergies  Allergen Reactions  . Codeine Nausea And Vomiting    History   Social History  . Marital Status: Widowed    Spouse Name: N/A    Number of Children: N/A  . Years of Education: N/A   Occupational History  . Not on file.   Social History Main Topics  . Smoking status: Never Smoker   . Smokeless tobacco: Not on file  .   Alcohol Use: No  . Drug Use: No  . Sexually Active: Not on file   Other Topics Concern  . 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    Family History  Problem Relation Age of Onset  . Heart attack Father   . Hip fracture Mother     complication  . Lung cancer Brother   . Heart attack Brother     massive  . Other Brother     mva  . Thyroid cancer Sister   . Atrial fibrillation Sister   . Ovarian cancer Sister     surviver    ROS- All systems are reviewed and negative except as per the HPI above  Physical Exam: Filed Vitals:   05/13/12 1236  BP: 163/77  Pulse: 59  Height: 5' 3" (1.6 m)  Weight: 148 lb (67.132 kg)  SpO2: 100%    GEN- The patient is  well appearing, alert and oriented x 3 today.   Head- normocephalic, atraumatic Eyes-  Sclera clear, conjunctiva pink Ears- hearing intact Oropharynx- clear Neck- supple, no JVP Lymph- no cervical lymphadenopathy Lungs- Clear to ausculation bilaterally, normal work of breathing Heart- Regular rate and rhythm, no murmurs, rubs or gallops, PMI not laterally displaced GI- soft, NT, ND, + BS Extremities- no clubbing, cyanosis, or edema MS- no significant deformity or atrophy Skin- no rash or lesion Psych- euthymic mood, full affect Neuro- strength and sensation are intact  EKG today reveals sinus rhythm 59 bpm, PR 190, QRS 102, Qtc 447, otherwise normal ekg Echo 12/16/11- EF 61%, mild MR, LA 31mm  Assessment and Plan:  

## 2012-06-07 NOTE — Anesthesia Preprocedure Evaluation (Signed)
Anesthesia Evaluation  Patient identified by MRN, date of birth, ID band Patient awake    Reviewed: Allergy & Precautions, H&P , NPO status , Patient's Chart, lab work & pertinent test results, reviewed documented beta blocker date and time   Airway Mallampati: II TM Distance: >3 FB   Mouth opening: Limited Mouth Opening  Dental  (+) Teeth Intact and Dental Advisory Given   Pulmonary  breath sounds clear to auscultation        Cardiovascular Rhythm:Regular Rate:Normal     Neuro/Psych    GI/Hepatic   Endo/Other    Renal/GU      Musculoskeletal   Abdominal   Peds  Hematology   Anesthesia Other Findings   Reproductive/Obstetrics                           Anesthesia Physical Anesthesia Plan  ASA: III  Anesthesia Plan: General   Post-op Pain Management:    Induction: Intravenous  Airway Management Planned: Oral ETT  Additional Equipment:   Intra-op Plan:   Post-operative Plan: Extubation in OR  Informed Consent: I have reviewed the patients History and Physical, chart, labs and discussed the procedure including the risks, benefits and alternatives for the proposed anesthesia with the patient or authorized representative who has indicated his/her understanding and acceptance.   Dental advisory given  Plan Discussed with: CRNA and Surgeon  Anesthesia Plan Comments: (Paroxysmal Afib, nl nuclear stress test , nl EF htn GERD Plan GA with ETT  Kipp Brood, MD)        Anesthesia Quick Evaluation

## 2012-06-08 DIAGNOSIS — I4892 Unspecified atrial flutter: Secondary | ICD-10-CM

## 2012-06-08 DIAGNOSIS — I4891 Unspecified atrial fibrillation: Secondary | ICD-10-CM

## 2012-06-08 LAB — BASIC METABOLIC PANEL
CO2: 26 mEq/L (ref 19–32)
Chloride: 105 mEq/L (ref 96–112)
Glucose, Bld: 93 mg/dL (ref 70–99)
Potassium: 3.7 mEq/L (ref 3.5–5.1)
Sodium: 140 mEq/L (ref 135–145)

## 2012-06-08 LAB — PROTIME-INR: INR: 3.21 — ABNORMAL HIGH (ref 0.00–1.49)

## 2012-06-08 MED ORDER — WARFARIN SODIUM 5 MG PO TABS: 2.5000 mg | ORAL_TABLET | Freq: Every day | ORAL | Status: DC

## 2012-06-08 MED ORDER — METOPROLOL TARTRATE 25 MG PO TABS: 25.0000 mg | ORAL_TABLET | Freq: Two times a day (BID) | ORAL | Status: DC

## 2012-06-08 NOTE — Progress Notes (Signed)
   ELECTROPHYSIOLOGY ROUNDING NOTE    Patient Name: Monica [name] Date of Encounter: 06-08-2012    SUBJECTIVE:Patient feels well.  No chest pain or shortness of breath.  S/p RFCA of atrial fibrillation/flutter 06-07-2012  TELEMETRY: Reviewed telemetry pt in sinus rhythm Filed Vitals:   06/07/12 1935 06/07/12 1954 06/07/12 2322 06/08/12 0336  BP:  119/56 111/48 130/56  Pulse:   61   Temp:  98.1 F (36.7 C) 98 F (36.7 C) 97.9 F (36.6 C)  TempSrc:  Oral Oral Oral  Resp: 19  13 14   Height:      Weight:      SpO2:  97% 96% 97%    Intake/Output Summary (Last 24 hours) at 06/08/12 0719 Last data filed at 06/08/12 0400  Gross per 24 hour  Intake   1680 ml  Output   1125 ml  Net    555 ml   Physical Exam: GEN- The patient is well appearing, alert and oriented x 3 today.   Head- normocephalic, atraumatic Eyes-  Sclera clear, conjunctiva pink Ears- hearing intact Oropharynx- clear Neck- supple, no JVP Lymph- no cervical lymphadenopathy Lungs- Clear to ausculation bilaterally, normal work of breathing Heart- Regular rate and rhythm, no murmurs, rubs or gallops, PMI not laterally displaced GI- soft, NT, ND, + BS Extremities- no clubbing, cyanosis, or edema, mild ecchymosis no hematoma MS- no significant deformity or atrophy Skin- no rash or lesion Psych- euthymic mood, full affect Neuro- strength and sensation are intact  LABS: Basic Metabolic Panel:  Basename 06/08/12 0435  NA 140  K 3.7  CL 105  CO2 26  GLUCOSE 93  BUN 10  CREATININE 0.78  CALCIUM 8.2*  MG --  PHOS --   INR: 3.21  Assessment and Plan: 1. Afib/ atrial flutter- doing well s/p ablation  Restrictions, wound care reviewed with patient.  Continue rhythmol.cut metoprolol dose in half. Hold coumadin today.  Resume tomorrow.  She should have INR checked on Monday with [facility].   Routine follow up scheduled (12 weeks) with me.    Fayrene Fearing Addylin Manke,MD

## 2012-06-08 NOTE — Discharge Summary (Signed)
ELECTROPHYSIOLOGY PROCEDURE DISCHARGE SUMMARY    Patient ID: Monica [name],  MRN: [id number], DOB/AGE: [birth year] 71 y.o.  Admit date: 06/07/2012 Discharge date: 06/08/2012  Primary Care Physician: Merri Brunette, MD Primary Cardiologist: Armanda Magic, MD Electrophysiologist: Hillis Range, MD  Primary Discharge Diagnosis:  Atrial fibrillation and atrial flutter status post ablation this admission  Secondary Discharge Diagnosis:  1.  Hypertension 2.  Osteoporosis 3.  Adrenal adenoma 4.  Chronic anticoagulation with Warfarin   Procedures This Admission:  1.  Electrophysiology study and radiofrequency catheter ablation of atrial fibrillation and atrial flutter on 06-07-2012 by Dr Johney Frame.  This study demonstrated  sinus rhythm upon presentation, rotational Angiography reveals a large sized left atrium with four separate pulmonary veins without evidence of pulmonary vein stenosis, successful electrical isolation and anatomical encircling of all four pulmonary veins with radiofrequency current, cavo-tricuspid isthmus ablation was performed with complete bidirectional isthmus block achieved, no inducible sustained arrhythmias following ablation, no early apparent complications.   Brief HPI: Monica [name] is a 71 y.o. female with a history of paroxysmal atrial fibrillation and typical appearing atrial flutter who now presents for EP study and radiofrequency ablation. The patient has symptomatic paroxysmal atrial fibrillation. She has also recently been documented to have typical appearing atrial flutter. She has failed medical therapy with metoprolol and rhythmol. The patient therefore presents today for catheter ablation of atrial fibrillation and atrial flutter.  Hospital Course:  The patient was admitted and underwent RFCA of atrial fibrillation and atrial flutter as outlined above.  She was monitored overnight on telemetry which demonstrated sinus rhythm.  Her groins were without  hematoma or bruit.  Dr Johney Frame considered her stable for discharge to home.  Her INR on day of discharge was 3.21 and so Dr Johney Frame recommended holding Coumadin today and then resuming home dose.  She will have follow up with [facility] on Monday for an INR check.  Routine follow up was scheduled.  Restrictions, wound care were reviewed with the patient.   Discharge Vitals: Blood pressure 127/52, pulse 66, temperature 97.8 F (36.6 C), temperature source Oral, resp. rate 17, height 5\' 3"  (1.6 m), weight 148 lb (67.132 kg), SpO2 96.00%.    Labs:   Lab Results  Component Value Date   WBC 4.9 05/31/2012   HGB 12.8 05/31/2012   HCT 40.4 05/31/2012   MCV 86.7 05/31/2012   PLT 265.0 05/31/2012     Lab 06/08/12 0435  NA 140  K 3.7  CL 105  CO2 26  BUN 10  CREATININE 0.78  CALCIUM 8.2*  PROT --  BILITOT --  ALKPHOS --  ALT --  AST --  GLUCOSE 93     Discharge Medications:    Medication List     As of 06/08/2012 10:21 AM    TAKE these medications         acetaminophen 500 MG tablet   Commonly known as: TYLENOL   Take 500 mg by mouth every 6 (six) hours as needed. For pain      calcium carbonate 1250 MG capsule   Take 1,250 mg by mouth 2 (two) times daily with a meal.      cholecalciferol 1000 UNITS tablet   Commonly known as: VITAMIN D   Take 1,000 Units by mouth every evening.      metoprolol tartrate 25 MG tablet   Commonly known as: LOPRESSOR   Take 1 tablet (25 mg total) by mouth 2 (two) times daily.  omeprazole 20 MG capsule   Commonly known as: PRILOSEC   Take 20 mg by mouth 2 (two) times daily.      pravastatin 20 MG tablet   Commonly known as: PRAVACHOL   Take 20 mg by mouth daily.      ranitidine 150 MG tablet   Commonly known as: ZANTAC   Take 150 mg by mouth daily.      RECLAST 5 MG/100ML Soln   Generic drug: zoledronic acid   As directed once a yr osteoporosis      RYTHMOL SR 325 MG 12 hr capsule   Generic drug: propafenone   Take  325 mg by mouth 2 (two) times daily.      warfarin 5 MG tablet   Commonly known as: COUMADIN   Take 0.5 tablets (2.5 mg total) by mouth daily. HOLD COUMADIN TONIGHT (06/08/2012). RESUME COUMADIN TOMORROW (06/09/2012). HAVE INR CHECKED ON MONDAY (06/13/2012).          Disposition:  Discharge Orders    Future Appointments: Provider: Department: Dept Phone: Center:   09/12/2012 11:00 AM Hillis Range, MD [facility] [phone] [facility]     Future Orders Please Complete By Expires   Diet - low sodium heart healthy      Increase activity slowly        Follow-up Information    Follow up with Hillis Range, MD. On 09/12/2012. (At 11:00 AM)    Contact information:   [address] [place] [address] [phone]       Follow up with [facility]. Schedule an appointment as soon as possible for a visit on 06/13/2012. (For Coumadin follow-up (INR check))    Contact information:   [address]  [address] [place] [address] [phone]         Duration of Discharge Encounter: Greater than 30 minutes including physician time.  Signed, Gypsy Balsam, RN, BSN 06/08/2012, 10:21 AM  Jarold Song

## 2012-06-09 ENCOUNTER — Telehealth: Payer: Self-pay | Admitting: Internal Medicine

## 2012-06-09 NOTE — Telephone Encounter (Signed)
Pt had procedure Tuesday by dr Johney Frame, had to have a catheter and now has blood in her urine, stinging and burning when voiding, thinks she has an infection, pls advise pt @ [phone]

## 2012-06-13 NOTE — Telephone Encounter (Signed)
She went in to see her PCP and got an antibiotic and is feeling better

## 2012-09-12 ENCOUNTER — Encounter: Payer: Self-pay | Admitting: Internal Medicine

## 2012-09-12 ENCOUNTER — Ambulatory Visit (INDEPENDENT_AMBULATORY_CARE_PROVIDER_SITE_OTHER): Payer: Medicare Other | Admitting: Internal Medicine

## 2012-09-12 VITALS — BP 145/62 | HR 59 | Ht 63.0 in | Wt 148.0 lb

## 2012-09-12 DIAGNOSIS — I4891 Unspecified atrial fibrillation: Secondary | ICD-10-CM

## 2012-09-12 NOTE — Patient Instructions (Signed)
Your physician recommends that you schedule a follow-up appointment in: 3 months with Dr Johney Frame   Your physician has recommended you make the following change in your medication:  1) Stop Rhythmol

## 2012-09-12 NOTE — Progress Notes (Signed)
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  Since her recent afib ablation, the patient reports doing very well.  She is unaware of any afib.  She denies procedure related complications.  Today, she denies symptoms of palpitations, chest pain, shortness of breath,  lower extremity edema, dizziness, presyncope, or syncope.  The patient is otherwise without complaint today.   Past Medical History  Diagnosis Date  . Hypertension   . Normal nuclear stress test 2008    low risk  . Osteoporosis   . Kidney stone 6/11    rt. on ct 6/11  . 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 Paroxysmal atrial fibrillation   . GERD (gastroesophageal reflux disease)   . Hypercholesteremia    Past Surgical History  Procedure Date  . Cholecystectomy 1989  . Cesarean section 1978-1979    x-2  . Cardioversion 11/10/2011    Procedure: CARDIOVERSION;  Surgeon: Quintella Reichert, MD;  Location: [facility] OR;  Service: Cardiovascular;  Laterality: N/A;  . Cardioversion 05/06/2012    Procedure: CARDIOVERSION;  Surgeon: Quintella Reichert, MD;  Location: [facility] ENDOSCOPY;  Service: Cardiovascular;  Laterality: N/A;  h/p in file drawer  . Tee without cardioversion 06/06/2012    Procedure: TRANSESOPHAGEAL ECHOCARDIOGRAM (TEE);  Surgeon: Corky Crafts, MD;  Location: [facility] ENDOSCOPY;  Service: Cardiovascular;  Laterality: N/A;  . Atrial fibrillation and atrial flutter ablation 06/07/12    PVI and CTI ablation by Dr Johney Frame    Current Outpatient Prescriptions  Medication Sig Dispense Refill  . acetaminophen (TYLENOL) 500 MG tablet Take 500 mg by mouth every 6 (six) hours as needed. For pain      . calcium carbonate 1250 MG capsule Take 1,250 mg by mouth 2 (two) times daily with a meal.      . cholecalciferol (VITAMIN D) 1000 UNITS tablet Take 1,000 Units by mouth every evening.       . metoprolol (LOPRESSOR) 25 MG  tablet Take 1 tablet (25 mg total) by mouth 2 (two) times daily.  60 tablet  3  . pravastatin (PRAVACHOL) 20 MG tablet Take 20 mg by mouth daily.      . propafenone (RYTHMOL SR) 325 MG 12 hr capsule Take 325 mg by mouth 2 (two) times daily.        . ranitidine (ZANTAC) 150 MG tablet Take 150 mg by mouth 2 (two) times daily.       Marland Kitchen warfarin (COUMADIN) 5 MG tablet Take 0.5 tablets (2.5 mg total) by mouth daily. HOLD COUMADIN TONIGHT (06/08/2012). RESUME COUMADIN TOMORROW (06/09/2012). HAVE INR CHECKED ON MONDAY (06/13/2012).      . zoledronic acid (RECLAST) 5 MG/100ML SOLN As directed once a yr osteoporosis        Physical Exam: Filed Vitals:   09/12/12 1056  BP: 145/62  Pulse: 59  Height: 5\' 3"  (1.6 m)  Weight: 148 lb (67.132 kg)    GEN- The patient is well appearing, alert and oriented x 3 today.   Head- normocephalic, atraumatic Eyes-  Sclera clear, conjunctiva pink Ears- hearing intact Oropharynx- clear Lungs- Clear to ausculation bilaterally, normal work of breathing Heart- Regular rate and rhythm, no murmurs, rubs or gallops, PMI not laterally displaced GI- soft, NT, ND, + BS Extremities- no clubbing, cyanosis, or edema  ekg today reveals sinus rhythm 58 bpm, PR 184, QRs 88 msec, otherwise normal ekg  Assessment and Plan:

## 2012-09-12 NOTE — Assessment & Plan Note (Signed)
Doing well s/p ablation Stop rhythmol Continue coumadin (CHADS2 score is 1) Return in 3 months. Consider stopping metoprolol at that time.

## 2012-09-26 ENCOUNTER — Other Ambulatory Visit (HOSPITAL_COMMUNITY): Payer: Self-pay | Admitting: Cardiology

## 2012-10-01 ENCOUNTER — Other Ambulatory Visit: Payer: Self-pay

## 2012-12-14 ENCOUNTER — Encounter: Payer: Self-pay | Admitting: Internal Medicine

## 2012-12-14 ENCOUNTER — Ambulatory Visit (INDEPENDENT_AMBULATORY_CARE_PROVIDER_SITE_OTHER): Payer: Medicare Other | Admitting: Internal Medicine

## 2012-12-14 VITALS — BP 183/80 | HR 56 | Ht 63.0 in | Wt 148.2 lb

## 2012-12-14 DIAGNOSIS — I4891 Unspecified atrial fibrillation: Secondary | ICD-10-CM

## 2012-12-14 MED ORDER — METOPROLOL TARTRATE 25 MG PO TABS: ORAL_TABLET | ORAL | Status: DC

## 2012-12-14 NOTE — Patient Instructions (Addendum)
Your physician wants you to follow-up in: 6 months with Dr. Allred. You will receive a reminder letter in the mail two months in advance. If you don't receive a letter, please call our office to schedule the follow-up appointment.  

## 2012-12-14 NOTE — Progress Notes (Signed)
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  Since her recent afib ablation, the patient reports doing very well.  She remains very happy with the results of her procedure and denies prolonged palpitations or afib.  Today, she denies symptoms of chest pain, shortness of breath,  lower extremity edema, dizziness, presyncope, or syncope.  The patient is otherwise without complaint today.   Past Medical History  Diagnosis Date  . Hypertension   . Normal nuclear stress test 2008    low risk  . Osteoporosis   . Kidney stone 6/11    rt. on ct 6/11  . 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 Paroxysmal atrial fibrillation   . GERD (gastroesophageal reflux disease)   . Hypercholesteremia    Past Surgical History  Procedure Laterality Date  . Cholecystectomy  1989  . Cesarean section  1978-1979    x-2  . Cardioversion  11/10/2011    Procedure: CARDIOVERSION;  Surgeon: Quintella Reichert, MD;  Location: [facility] OR;  Service: Cardiovascular;  Laterality: N/A;  . Cardioversion  05/06/2012    Procedure: CARDIOVERSION;  Surgeon: Quintella Reichert, MD;  Location: [facility] ENDOSCOPY;  Service: Cardiovascular;  Laterality: N/A;  h/p in file drawer  . Tee without cardioversion  06/06/2012    Procedure: TRANSESOPHAGEAL ECHOCARDIOGRAM (TEE);  Surgeon: Corky Crafts, MD;  Location: [facility] ENDOSCOPY;  Service: Cardiovascular;  Laterality: N/A;  . Atrial fibrillation and atrial flutter ablation  06/07/12    PVI and CTI ablation by Dr Johney Frame    Current Outpatient Prescriptions  Medication Sig Dispense Refill  . acetaminophen (TYLENOL) 500 MG tablet Take 500 mg by mouth every 6 (six) hours as needed. For pain      . calcium carbonate 1250 MG capsule Take 1,250 mg by mouth 2 (two) times daily with a meal.      . cholecalciferol (VITAMIN D) 1000 UNITS tablet Take 1,000 Units by mouth every evening.       .  pravastatin (PRAVACHOL) 20 MG tablet Take 20 mg by mouth daily.      . ranitidine (ZANTAC) 150 MG tablet Take 150 mg by mouth 2 (two) times daily.       Marland Kitchen warfarin (COUMADIN) 5 MG tablet Take 0.5 tablets (2.5 mg total) by mouth daily. HOLD COUMADIN TONIGHT (06/08/2012). RESUME COUMADIN TOMORROW (06/09/2012). HAVE INR CHECKED ON MONDAY (06/13/2012).      . zoledronic acid (RECLAST) 5 MG/100ML SOLN As directed once a yr osteoporosis      . metoprolol tartrate (LOPRESSOR) 25 MG tablet TAKE 1 TABLET (25 MG TOTAL) BY MOUTH 2 (TWO) TIMES DAILY.  60 tablet  3   No current facility-administered medications for this visit.    Physical Exam: Filed Vitals:   12/14/12 0941  BP: 183/80  Pulse: 56  Height: 5\' 3"  (1.6 m)  Weight: 148 lb 3.2 oz (67.223 kg)    GEN- The patient is well appearing, alert and oriented x 3 today.   Head- normocephalic, atraumatic Eyes-  Sclera clear, conjunctiva pink Ears- hearing intact Oropharynx- clear Lungs- Clear to ausculation bilaterally, normal work of breathing Heart- Regular rate and rhythm, no murmurs, rubs or gallops, PMI not laterally displaced GI- soft, NT, ND, + BS Extremities- no clubbing, cyanosis, or edema  ekg today reveals sinus rhythm,  otherwise normal ekg  Assessment and Plan:  1. afib Maintaining sinus  rhythm off of AAD Continue anticoagulation  (CHADS2VASC score is 3)  Return in 6 months

## 2013-02-20 ENCOUNTER — Telehealth: Payer: Self-pay | Admitting: Internal Medicine

## 2013-02-20 NOTE — Telephone Encounter (Signed)
Returned call to patient she stated she has been having episodes of atrial fib.Stated she had atrial fib 01/26/13 and 01/31/13 each episode lasting appox 11/2 hours.Stated had atrial fib 02/16/13 that lasted appox 5 hours.Stated had atrial fib this morning that lasted 11/2 hours.Stated Dr.Allred did a ablation last year and metoprolol decreased to 25 mg twice a day.Patient in recalls to see Dr.Allred in 10/14.Message sent to [facility] for advice.

## 2013-02-20 NOTE — Telephone Encounter (Signed)
New Prob     Pt states she is having afib episodes and would like to speak to a nurse regarding this.

## 2013-02-20 NOTE — Telephone Encounter (Signed)
Please add in to my clinic this Wednesday

## 2013-02-21 NOTE — Telephone Encounter (Signed)
Patient called appointment scheduled with Dr.Allred 02/22/13 at 2:00 pm.

## 2013-02-22 ENCOUNTER — Encounter: Payer: Self-pay | Admitting: Internal Medicine

## 2013-02-22 ENCOUNTER — Ambulatory Visit (INDEPENDENT_AMBULATORY_CARE_PROVIDER_SITE_OTHER): Payer: Medicare Other | Admitting: Internal Medicine

## 2013-02-22 VITALS — BP 142/82 | HR 62 | Ht 62.0 in | Wt 144.8 lb

## 2013-02-22 DIAGNOSIS — I4891 Unspecified atrial fibrillation: Secondary | ICD-10-CM

## 2013-02-22 DIAGNOSIS — I10 Essential (primary) hypertension: Secondary | ICD-10-CM

## 2013-02-22 MED ORDER — METOPROLOL TARTRATE 25 MG PO TABS: ORAL_TABLET | ORAL | Status: DC

## 2013-02-22 NOTE — Progress Notes (Signed)
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  Since her recent afib ablation, the patient reports doing reasonably well.  She thinks that she has had 4-5 episodes of afib over the past month, lasting 10-12 hours each.  She reports palpitations, fatigue, and postural dizziness with episodes.  Today, she denies symptoms of chest pain, shortness of breath,  lower extremity edema, presyncope, or syncope.  The patient is otherwise without complaint today.   Past Medical History  Diagnosis Date  . Hypertension   . Normal nuclear stress test 2008    low risk  . Osteoporosis   . Kidney stone 6/11    rt. on ct 6/11  . 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 Paroxysmal atrial fibrillation   . GERD (gastroesophageal reflux disease)   . Hypercholesteremia    Past Surgical History  Procedure Laterality Date  . Cholecystectomy  1989  . Cesarean section  1978-1979    x-2  . Cardioversion  11/10/2011    Procedure: CARDIOVERSION;  Surgeon: Quintella Reichert, MD;  Location: [facility] OR;  Service: Cardiovascular;  Laterality: N/A;  . Cardioversion  05/06/2012    Procedure: CARDIOVERSION;  Surgeon: Quintella Reichert, MD;  Location: [facility] ENDOSCOPY;  Service: Cardiovascular;  Laterality: N/A;  h/p in file drawer  . Tee without cardioversion  06/06/2012    Procedure: TRANSESOPHAGEAL ECHOCARDIOGRAM (TEE);  Surgeon: Corky Crafts, MD;  Location: [facility] ENDOSCOPY;  Service: Cardiovascular;  Laterality: N/A;  . Atrial fibrillation and atrial flutter ablation  06/07/12    PVI and CTI ablation by Dr Johney Frame    Current Outpatient Prescriptions  Medication Sig Dispense Refill  . acetaminophen (TYLENOL) 500 MG tablet Take 500 mg by mouth every 6 (six) hours as needed. For pain      . calcium carbonate 1250 MG capsule Take 1,250 mg by mouth 2 (two) times daily with a meal.      . cholecalciferol (VITAMIN D)  1000 UNITS tablet Take 1,000 Units by mouth every evening.       . metoprolol tartrate (LOPRESSOR) 25 MG tablet TAKE 1 TABLET (25 MG TOTAL) BY MOUTH 2 (TWO) TIMES DAILY.  60 tablet  6  . pravastatin (PRAVACHOL) 20 MG tablet Take 20 mg by mouth daily.      . ranitidine (ZANTAC) 150 MG tablet Take 150 mg by mouth 2 (two) times daily.       Marland Kitchen warfarin (COUMADIN) 5 MG tablet Take 0.5 tablets (2.5 mg total) by mouth daily. HOLD COUMADIN TONIGHT (06/08/2012). RESUME COUMADIN TOMORROW (06/09/2012). HAVE INR CHECKED ON MONDAY (06/13/2012).      . zoledronic acid (RECLAST) 5 MG/100ML SOLN As directed once a yr osteoporosis       No current facility-administered medications for this visit.    Physical Exam: Filed Vitals:   02/22/13 1410  BP: 142/82  Pulse: 62  Height: 5\' 2"  (1.575 m)  Weight: 144 lb 12.8 oz (65.681 kg)    GEN- The patient is well appearing, alert and oriented x 3 today.   Head- normocephalic, atraumatic Eyes-  Sclera clear, conjunctiva pink Ears- hearing intact Oropharynx- clear Lungs- Clear to ausculation bilaterally, normal work of breathing Heart- Regular rate and rhythm, no murmurs, rubs or gallops, PMI not laterally displaced GI- soft, NT, ND, + BS Extremities- no clubbing, cyanosis, or edema  ekg today reveals sinus rhythm,  otherwise  normal ekg  Assessment and Plan:  1. afib She has had some episodes of symptomatic afib Increase metoprolol to 37.5mg  BID Take an additional metoprolol during afib Continue anticoagulation  (CHADS2VASC score is 3) Repeat echo to evaluate La size and look for structural changes  2. HTN Stable No change required today   Return in 6 weeks

## 2013-02-22 NOTE — Patient Instructions (Addendum)
Your physician recommends that you schedule a follow-up appointment in:   6 WEEKS WITH DR [facility]  Your physician has recommended you make the following change in your medication:   INCREASE  METOPROLOL TO  1 1/2 TABS  TWICE DAILY   Your physician has requested that you have an echocardiogram. Echocardiography is a painless test that uses sound waves to create images of your heart. It provides your doctor with information about the size and shape of your heart and how well your heart's chambers and valves are working. This procedure takes approximately one hour. There are no restrictions for this procedure.

## 2013-02-23 ENCOUNTER — Other Ambulatory Visit (HOSPITAL_COMMUNITY): Payer: Self-pay | Admitting: Internal Medicine

## 2013-02-23 DIAGNOSIS — I4891 Unspecified atrial fibrillation: Secondary | ICD-10-CM

## 2013-02-28 ENCOUNTER — Ambulatory Visit (HOSPITAL_COMMUNITY): Payer: Medicare Other | Attending: Internal Medicine

## 2013-02-28 DIAGNOSIS — I10 Essential (primary) hypertension: Secondary | ICD-10-CM | POA: Insufficient documentation

## 2013-02-28 DIAGNOSIS — I059 Rheumatic mitral valve disease, unspecified: Secondary | ICD-10-CM | POA: Insufficient documentation

## 2013-02-28 DIAGNOSIS — I079 Rheumatic tricuspid valve disease, unspecified: Secondary | ICD-10-CM | POA: Insufficient documentation

## 2013-02-28 DIAGNOSIS — I4891 Unspecified atrial fibrillation: Secondary | ICD-10-CM | POA: Insufficient documentation

## 2013-02-28 NOTE — Progress Notes (Signed)
Echocardiogram performed.  

## 2013-03-22 ENCOUNTER — Other Ambulatory Visit: Payer: Self-pay

## 2013-04-24 ENCOUNTER — Ambulatory Visit (INDEPENDENT_AMBULATORY_CARE_PROVIDER_SITE_OTHER): Payer: Medicare Other | Admitting: Internal Medicine

## 2013-04-24 ENCOUNTER — Encounter: Payer: Self-pay | Admitting: Internal Medicine

## 2013-04-24 VITALS — BP 164/80 | HR 61 | Ht 63.0 in | Wt 145.0 lb

## 2013-04-24 DIAGNOSIS — I4891 Unspecified atrial fibrillation: Secondary | ICD-10-CM

## 2013-04-24 DIAGNOSIS — I4892 Unspecified atrial flutter: Secondary | ICD-10-CM

## 2013-04-24 DIAGNOSIS — I10 Essential (primary) hypertension: Secondary | ICD-10-CM

## 2013-04-24 LAB — BASIC METABOLIC PANEL
CO2: 29 mEq/L (ref 19–32)
Calcium: 9.3 mg/dL (ref 8.4–10.5)
Chloride: 106 mEq/L (ref 96–112)
Potassium: 4.2 mEq/L (ref 3.5–5.1)
Sodium: 139 mEq/L (ref 135–145)

## 2013-04-24 MED ORDER — LOSARTAN POTASSIUM 25 MG PO TABS: 25.0000 mg | ORAL_TABLET | Freq: Every day | ORAL | Status: DC

## 2013-04-24 NOTE — Progress Notes (Signed)
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  She has had only 3 episodes of afib since her last visit.  She has tolerated them well.  Longest episode was 24 hours.  Today, she denies symptoms of chest pain, shortness of breath,  lower extremity edema, presyncope, or syncope.  The patient is otherwise without complaint today.   Past Medical History  Diagnosis Date  . Hypertension   . Normal nuclear stress test 2008    low risk  . Osteoporosis   . Kidney stone 6/11    rt. on ct 6/11  . 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 Paroxysmal atrial fibrillation   . GERD (gastroesophageal reflux disease)   . Hypercholesteremia    Past Surgical History  Procedure Laterality Date  . Cholecystectomy  1989  . Cesarean section  1978-1979    x-2  . Cardioversion  11/10/2011    Procedure: CARDIOVERSION;  Surgeon: Quintella Reichert, MD;  Location: [facility] OR;  Service: Cardiovascular;  Laterality: N/A;  . Cardioversion  05/06/2012    Procedure: CARDIOVERSION;  Surgeon: Quintella Reichert, MD;  Location: [facility] ENDOSCOPY;  Service: Cardiovascular;  Laterality: N/A;  h/p in file drawer  . Tee without cardioversion  06/06/2012    Procedure: TRANSESOPHAGEAL ECHOCARDIOGRAM (TEE);  Surgeon: Corky Crafts, MD;  Location: [facility] ENDOSCOPY;  Service: Cardiovascular;  Laterality: N/A;  . Atrial fibrillation and atrial flutter ablation  06/07/12    PVI and CTI ablation by Dr Johney Frame    Current Outpatient Prescriptions  Medication Sig Dispense Refill  . acetaminophen (TYLENOL) 500 MG tablet Take 500 mg by mouth every 6 (six) hours as needed. For pain      . calcium carbonate 1250 MG capsule Take 1,250 mg by mouth 2 (two) times daily with a meal.      . cholecalciferol (VITAMIN D) 1000 UNITS tablet Take 1,000 Units by mouth every evening.       . metoprolol tartrate (LOPRESSOR) 25 MG tablet TAKE   1 1/2 TABS  TWICE DAILY  90 tablet  11  . pravastatin (PRAVACHOL) 20 MG tablet Take 20 mg by mouth daily.      . ranitidine (ZANTAC) 150 MG tablet Take 150 mg by mouth 2 (two) times daily.       Marland Kitchen warfarin (COUMADIN) 5 MG tablet Take 0.5 tablets (2.5 mg total) by mouth daily. HOLD COUMADIN TONIGHT (06/08/2012). RESUME COUMADIN TOMORROW (06/09/2012). HAVE INR CHECKED ON MONDAY (06/13/2012).      . zoledronic acid (RECLAST) 5 MG/100ML SOLN As directed once a yr osteoporosis       No current facility-administered medications for this visit.    Physical Exam: Filed Vitals:   04/24/13 1435  BP: 164/80  Pulse: 61  Height: 5\' 3"  (1.6 m)  Weight: 145 lb (65.772 kg)    GEN- The patient is well appearing, alert and oriented x 3 today.   Head- normocephalic, atraumatic Eyes-  Sclera clear, conjunctiva pink Ears- hearing intact Oropharynx- clear Lungs- Clear to ausculation bilaterally, normal work of breathing Heart- Regular rate and rhythm, no murmurs, rubs or gallops, PMI not laterally displaced GI- soft, NT, ND, + BS Extremities- no clubbing, cyanosis, or edema  ekg today reveals sinus rhythm 61 bpm,  otherwise normal ekg  Assessment and Plan:  1. afib She has had some episodes of symptomatic afib Continue metoprolol to  37.5mg  BID Take an additional metoprolol during afib Continue anticoagulation  (CHADS2VASC score is 3) Echo reviewed She is not ready to consider repeat ablation but may be more willing to do so if her afib burden increases  2. HTN Above goal BP by MD 154/76 Add losartan 25mg  daily (has been shown to assist with atrial remodeling)    Return in 3 months

## 2013-04-24 NOTE — Patient Instructions (Signed)
Your physician recommends that you schedule a follow-up appointment in 3 months with Dr Johney Frame  Your physician recommends that you return for lab work today: [facility]  Your physician has recommended you make the following change in your medication:  1) Start Losartan 25mg  daily

## 2013-05-19 ENCOUNTER — Ambulatory Visit (INDEPENDENT_AMBULATORY_CARE_PROVIDER_SITE_OTHER): Payer: Medicare Other | Admitting: Pharmacist

## 2013-05-19 DIAGNOSIS — I4891 Unspecified atrial fibrillation: Secondary | ICD-10-CM

## 2013-05-22 ENCOUNTER — Other Ambulatory Visit: Payer: Self-pay | Admitting: Dermatology

## 2013-05-22 DIAGNOSIS — C4491 Basal cell carcinoma of skin, unspecified: Secondary | ICD-10-CM

## 2013-05-22 HISTORY — DX: Basal cell carcinoma of skin, unspecified: C44.91

## 2013-06-09 ENCOUNTER — Ambulatory Visit (INDEPENDENT_AMBULATORY_CARE_PROVIDER_SITE_OTHER): Payer: Medicare Other | Admitting: Pharmacist

## 2013-06-09 DIAGNOSIS — I4891 Unspecified atrial fibrillation: Secondary | ICD-10-CM

## 2013-06-09 LAB — POCT INR: INR: 1.9

## 2013-06-22 ENCOUNTER — Other Ambulatory Visit: Payer: Self-pay

## 2013-06-28 ENCOUNTER — Ambulatory Visit (INDEPENDENT_AMBULATORY_CARE_PROVIDER_SITE_OTHER): Payer: Medicare Other | Admitting: Pharmacist

## 2013-06-28 DIAGNOSIS — I4891 Unspecified atrial fibrillation: Secondary | ICD-10-CM

## 2013-06-28 LAB — POCT INR: INR: 2.9

## 2013-07-12 ENCOUNTER — Other Ambulatory Visit: Payer: Self-pay

## 2013-07-12 DIAGNOSIS — I4891 Unspecified atrial fibrillation: Secondary | ICD-10-CM

## 2013-07-12 MED ORDER — METOPROLOL TARTRATE 25 MG PO TABS: ORAL_TABLET | ORAL | Status: DC

## 2013-07-26 ENCOUNTER — Encounter (INDEPENDENT_AMBULATORY_CARE_PROVIDER_SITE_OTHER): Payer: Medicare Other

## 2013-07-26 ENCOUNTER — Ambulatory Visit (INDEPENDENT_AMBULATORY_CARE_PROVIDER_SITE_OTHER): Payer: Medicare Other | Admitting: Internal Medicine

## 2013-07-26 ENCOUNTER — Encounter: Payer: Self-pay | Admitting: Internal Medicine

## 2013-07-26 VITALS — BP 154/84 | HR 54 | Ht 63.0 in | Wt 145.0 lb

## 2013-07-26 DIAGNOSIS — I4891 Unspecified atrial fibrillation: Secondary | ICD-10-CM

## 2013-07-26 DIAGNOSIS — I10 Essential (primary) hypertension: Secondary | ICD-10-CM

## 2013-07-26 NOTE — Patient Instructions (Signed)
Your physician wants you to follow-up in: 9 months with Dr Allred You will receive a reminder letter in the mail two months in advance. If you don't receive a letter, please call our office to schedule the follow-up appointment.  

## 2013-07-26 NOTE — Progress Notes (Signed)
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 She has rare palpitations which are short lived.  Her quality of life is much improved post ablation.  Today, she denies symptoms of chest pain, shortness of breath,  lower extremity edema, presyncope, or syncope.  The patient is otherwise without complaint today.   Past Medical History  Diagnosis Date  . Hypertension   . Normal nuclear stress test 2008    low risk  . Osteoporosis   . Kidney stone 6/11    rt. on ct 6/11  . 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 Paroxysmal atrial fibrillation   . GERD (gastroesophageal reflux disease)   . Hypercholesteremia    Past Surgical History  Procedure Laterality Date  . Cholecystectomy  1989  . Cesarean section  1978-1979    x-2  . Cardioversion  11/10/2011    Procedure: CARDIOVERSION;  Surgeon: Quintella Reichert, MD;  Location: [facility] OR;  Service: Cardiovascular;  Laterality: N/A;  . Cardioversion  05/06/2012    Procedure: CARDIOVERSION;  Surgeon: Quintella Reichert, MD;  Location: [facility] ENDOSCOPY;  Service: Cardiovascular;  Laterality: N/A;  h/p in file drawer  . Tee without cardioversion  06/06/2012    Procedure: TRANSESOPHAGEAL ECHOCARDIOGRAM (TEE);  Surgeon: Corky Crafts, MD;  Location: [facility] ENDOSCOPY;  Service: Cardiovascular;  Laterality: N/A;  . Atrial fibrillation and atrial flutter ablation  06/07/12    PVI and CTI ablation by Dr Johney Frame    Current Outpatient Prescriptions  Medication Sig Dispense Refill  . acetaminophen (TYLENOL) 500 MG tablet Take 500 mg by mouth every 6 (six) hours as needed. For pain      . calcium carbonate 1250 MG capsule Take 1,250 mg by mouth 2 (two) times daily with a meal.      . cholecalciferol (VITAMIN D) 1000 UNITS tablet Take 1,000 Units by mouth every evening.       Marland Kitchen losartan (COZAAR) 25 MG tablet Take 1 tablet (25 mg total) by mouth daily.  90  tablet  3  . metoprolol tartrate (LOPRESSOR) 25 MG tablet TAKE   1 1/2 TABS TWICE DAILY  270 tablet  2  . pravastatin (PRAVACHOL) 20 MG tablet Take 20 mg by mouth daily.      . ranitidine (ZANTAC) 150 MG tablet Take 150 mg by mouth 2 (two) times daily.       Marland Kitchen warfarin (COUMADIN) 5 MG tablet Take by mouth. Take as directed by the coumadin clinic      . zoledronic acid (RECLAST) 5 MG/100ML SOLN As directed once a yr osteoporosis       No current facility-administered medications for this visit.    Physical Exam: Filed Vitals:   07/26/13 0950  BP: 154/84  Pulse: 54  Height: 5\' 3"  (1.6 m)  Weight: 145 lb (65.772 kg)    GEN- The patient is well appearing, alert and oriented x 3 today.   Head- normocephalic, atraumatic Eyes-  Sclera clear, conjunctiva pink Ears- hearing intact Oropharynx- clear Lungs- Clear to ausculation bilaterally, normal work of breathing Heart- Regular rate and rhythm, no murmurs, rubs or gallops, PMI not laterally displaced GI- soft, NT, ND, + BS Extremities- no clubbing, cyanosis, or edema  ekg today reveals sinus rhythm 54 bpm,  otherwise normal ekg  Assessment and Plan:  1. afib Much improved post ablation off of AADtherapy Continue anticoagulation  (  CHADS2VASC score is 3) No changes today  2. HTN Stable Titrate losartan as needed  Follow-up with Dr Mayford Knife as scheduled  Return in 9 months to see me

## 2013-07-28 ENCOUNTER — Ambulatory Visit (INDEPENDENT_AMBULATORY_CARE_PROVIDER_SITE_OTHER): Payer: Medicare Other | Admitting: *Deleted

## 2013-07-28 DIAGNOSIS — I4891 Unspecified atrial fibrillation: Secondary | ICD-10-CM

## 2013-07-28 LAB — POCT INR: INR: 2

## 2013-08-15 ENCOUNTER — Other Ambulatory Visit: Payer: Self-pay | Admitting: Radiology

## 2013-08-24 ENCOUNTER — Other Ambulatory Visit: Payer: Self-pay | Admitting: Dermatology

## 2013-08-25 ENCOUNTER — Ambulatory Visit (INDEPENDENT_AMBULATORY_CARE_PROVIDER_SITE_OTHER): Payer: Medicare Other | Admitting: Pharmacist

## 2013-08-25 DIAGNOSIS — I4891 Unspecified atrial fibrillation: Secondary | ICD-10-CM

## 2013-08-25 LAB — POCT INR: INR: 2.7

## 2013-09-22 ENCOUNTER — Ambulatory Visit (INDEPENDENT_AMBULATORY_CARE_PROVIDER_SITE_OTHER): Payer: Medicare Other

## 2013-09-22 DIAGNOSIS — I4891 Unspecified atrial fibrillation: Secondary | ICD-10-CM

## 2013-09-22 LAB — POCT INR: INR: 3.5

## 2013-10-13 ENCOUNTER — Ambulatory Visit (INDEPENDENT_AMBULATORY_CARE_PROVIDER_SITE_OTHER): Payer: Medicare Other | Admitting: *Deleted

## 2013-10-13 DIAGNOSIS — I4891 Unspecified atrial fibrillation: Secondary | ICD-10-CM

## 2013-10-13 LAB — POCT INR: INR: 2.6

## 2013-10-28 IMAGING — CR DG ABDOMEN 1V
1 series · 1 of 1 positions shown · non-contrast
Comparison: CT 05/11/2011

CLINICAL DATA: Near syncope

ABDOMEN - 1 VIEW

[t abdomen supine]
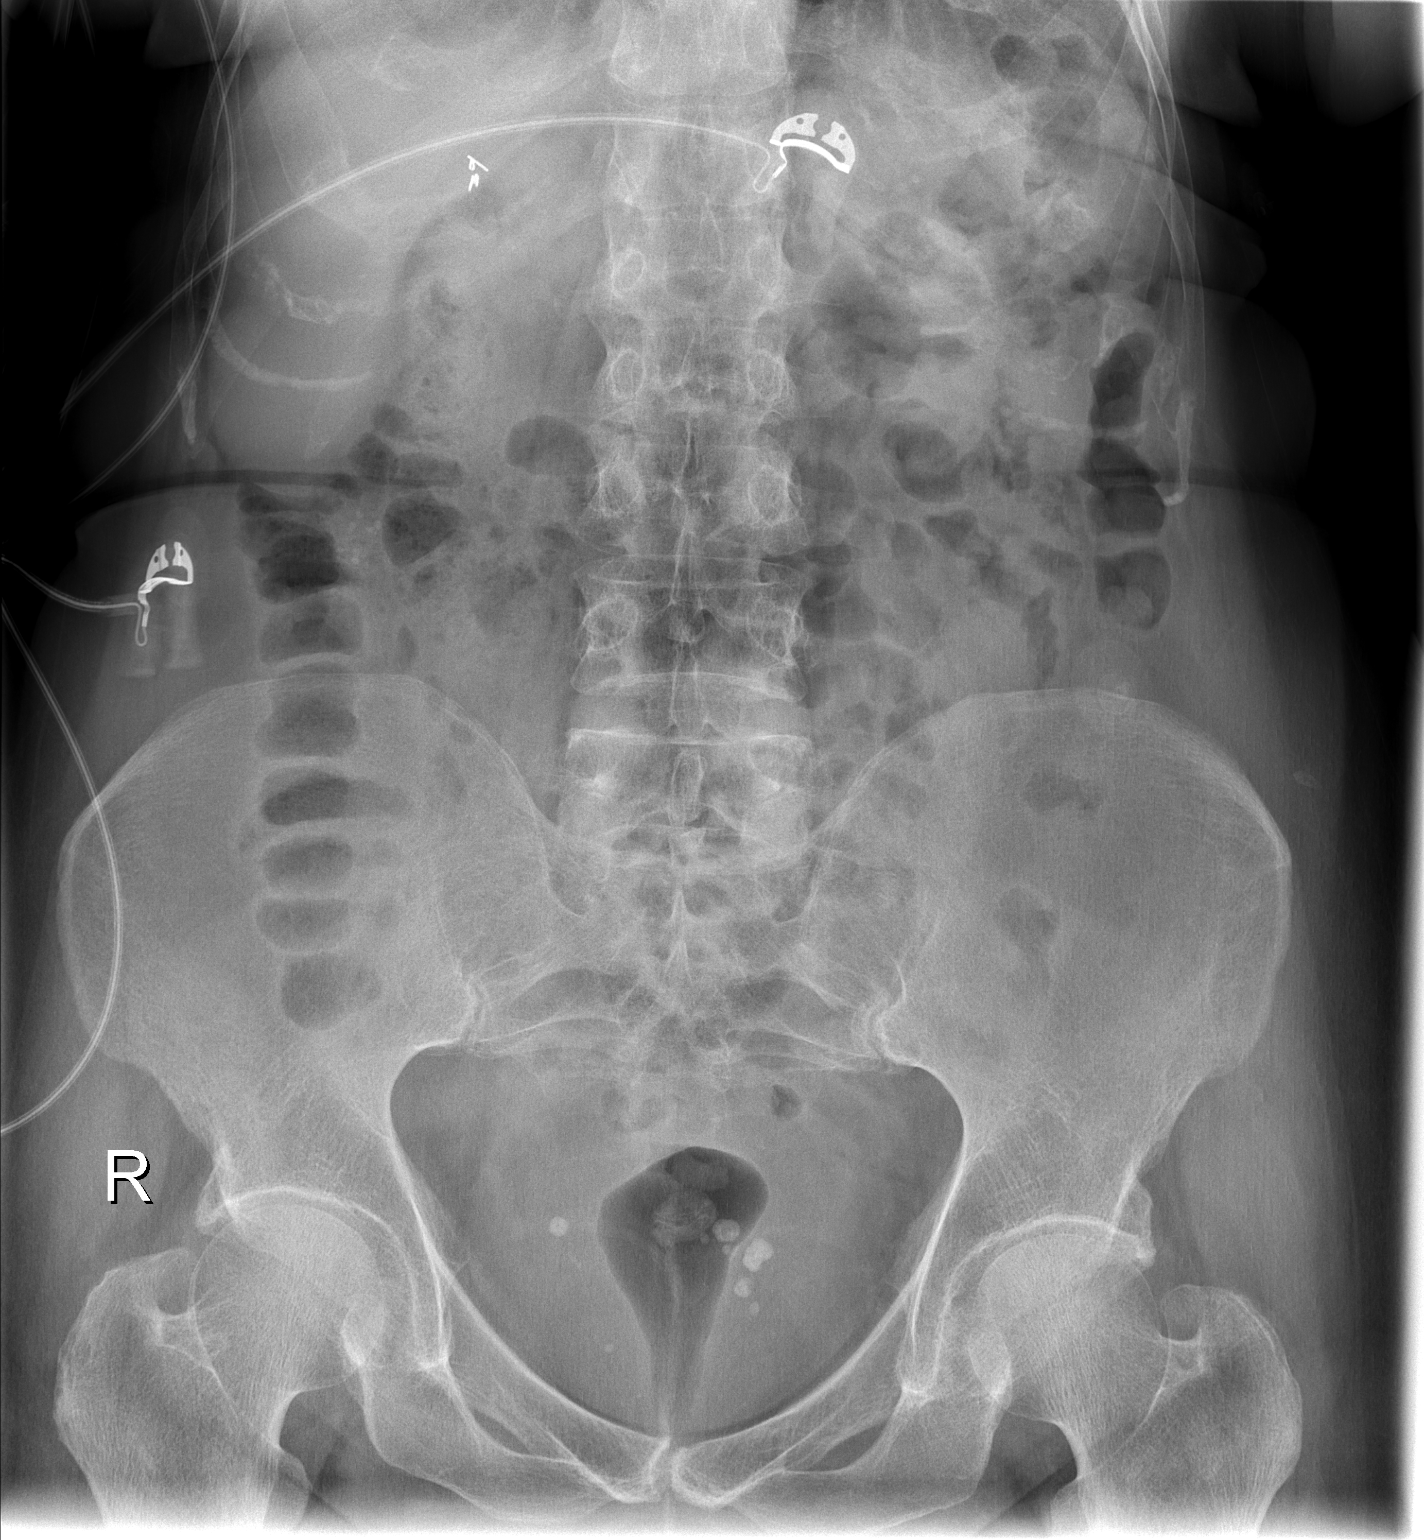

[1 of 1 positions shown; findings below may reference images not displayed]

FINDINGS: Negative for bowel obstruction or ileus.  Mild amount of
stool in the colon.  Gas in the rectum.  No acute bony changes.
IMPRESSION: No acute abnormality.

## 2013-11-10 ENCOUNTER — Ambulatory Visit (INDEPENDENT_AMBULATORY_CARE_PROVIDER_SITE_OTHER): Payer: Medicare Other

## 2013-11-10 DIAGNOSIS — I4891 Unspecified atrial fibrillation: Secondary | ICD-10-CM

## 2013-11-10 LAB — POCT INR: INR: 2.8

## 2013-12-08 ENCOUNTER — Ambulatory Visit (INDEPENDENT_AMBULATORY_CARE_PROVIDER_SITE_OTHER): Payer: Medicare Other | Admitting: Pharmacist

## 2013-12-08 DIAGNOSIS — I4891 Unspecified atrial fibrillation: Secondary | ICD-10-CM

## 2013-12-08 LAB — POCT INR: INR: 2.6

## 2013-12-13 ENCOUNTER — Encounter: Payer: Self-pay | Admitting: General Surgery

## 2013-12-27 ENCOUNTER — Ambulatory Visit (INDEPENDENT_AMBULATORY_CARE_PROVIDER_SITE_OTHER): Payer: Medicare Other | Admitting: Cardiology

## 2013-12-27 ENCOUNTER — Encounter: Payer: Self-pay | Admitting: Cardiology

## 2013-12-27 VITALS — BP 126/82 | HR 53 | Ht 63.0 in | Wt 144.8 lb

## 2013-12-27 DIAGNOSIS — R001 Bradycardia, unspecified: Secondary | ICD-10-CM

## 2013-12-27 DIAGNOSIS — I4891 Unspecified atrial fibrillation: Secondary | ICD-10-CM

## 2013-12-27 DIAGNOSIS — I498 Other specified cardiac arrhythmias: Secondary | ICD-10-CM

## 2013-12-27 DIAGNOSIS — I480 Paroxysmal atrial fibrillation: Secondary | ICD-10-CM | POA: Insufficient documentation

## 2013-12-27 DIAGNOSIS — I10 Essential (primary) hypertension: Secondary | ICD-10-CM

## 2013-12-27 NOTE — Patient Instructions (Signed)
Your physician recommends that you continue on your current medications as directed. Please refer to the Current Medication list given to you today.  Your physician wants you to follow-up in: 6 months with Dr Turner You will receive a reminder letter in the mail two months in advance. If you don't receive a letter, please call our office to schedule the follow-up appointment.  

## 2013-12-27 NOTE — Progress Notes (Signed)
[place], [place] [address], [place]  [address] Phone: [phone] Fax:  [phone]  Date:  12/27/2013   ID:  Monica [name], DOB 09-14-1940, MRN [id number]  PCP:  Reginia Naas, MD  Cardiologist:  Fransico Him, MD     History of Present Illness: Monica [name] is a 73 y.o. female with a history of PAF/atrial flutter s/p atrial flutter ablation and HTN who presents today.  She is doing well.  She denies any chest pain, SOB, DOE, LE edema ,dizziness or syncope.  She still has occasional palpitations that usually resolve with an extra dose of metoprolol.  She did have one episode last week that occurred one night and resolved the next day.  They occur 1-2 times monthly and usually last a few hours and resolve after taking the metoprolol.     Wt Readings from Last 3 Encounters:  12/27/13 144 lb 12.8 oz (65.681 kg)  07/26/13 145 lb (65.772 kg)  04/24/13 145 lb (65.772 kg)     Past Medical History  Diagnosis Date  . Normal nuclear stress test 2008    low risk  . Osteoporosis   . 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 Paroxysmal atrial fibrillation     s/p ablation 10/13  . GERD (gastroesophageal reflux disease)   . Hypercholesteremia   . Kidney stone 6/11    rt. on ct 6/11  . Hypertension     [facility]    Current Outpatient Prescriptions  Medication Sig Dispense Refill  . acetaminophen (TYLENOL) 500 MG tablet Take 500 mg by mouth every 6 (six) hours as needed. For pain      . calcium carbonate (OS-CAL) 600 MG TABS tablet Take 1,200 mg by mouth 2 (two) times daily with a meal.      . cholecalciferol (VITAMIN D) 1000 UNITS tablet Take 1,000 Units by mouth every evening.       Marland Kitchen losartan (COZAAR) 25 MG tablet Take 1 tablet (25 mg total) by mouth daily.  90 tablet  3  . metoprolol tartrate (LOPRESSOR) 25 MG tablet TAKE   1 1/2 TABS TWICE DAILY  270 tablet  2  . omeprazole (PRILOSEC) 20 MG capsule Take 20 mg by mouth daily.      . pravastatin (PRAVACHOL) 20 MG  tablet Take 20 mg by mouth daily.      Marland Kitchen warfarin (COUMADIN) 5 MG tablet Take by mouth. Take as directed by the coumadin clinic      . zoledronic acid (RECLAST) 5 MG/100ML SOLN As directed once a yr osteoporosis       No current facility-administered medications for this visit.    Allergies:    Allergies  Allergen Reactions  . Codeine Nausea And Vomiting    Social History:  The patient  reports that she has never smoked. She does not have any smokeless tobacco history on file. She reports that she does not drink alcohol or use illicit drugs.   Family History:  The patient's family history includes Atrial fibrillation in her sister; CAD in her father; Heart attack in her brother and father; Hip fracture in her mother; Lung cancer in her brother; Other in her brother; Ovarian cancer in her sister; Thyroid cancer in her sister.   ROS:  Please see the history of present illness.      All other systems reviewed and negative.   PHYSICAL EXAM: VS:  BP 126/82  Pulse 53  Ht 5\' 3"  (  1.6 m)  Wt 144 lb 12.8 oz (65.681 kg)  BMI 25.66 kg/m2 Well nourished, well developed, in no acute distress HEENT: normal Neck: no JVD Cardiac:  normal S1, S2; RRR; no murmur Lungs:  clear to auscultation bilaterally, no wheezing, rhonchi or rales Abd: soft, nontender, no hepatomegaly Ext: no edema Skin: warm and dry Neuro:  CNs 2-12 intact, no focal abnormalities noted  EKG:  Sinus bradycardia at 53bpm with no ST changes     ASSESSMENT AND PLAN:  1. PAF/aflutter s/p ablation in 2013 with only occasional breakthrough 1-2 times monthly and is short lived - continue anticoagulation (CHADS2VASC score is 3 - HTN/age>65/female) - continue metoprolol 2. HTN well controlled - continue Metoprolol/losartan 3.  Bradycardia - drug induced - she says that occasionally she will get a weak spell so I have asked her to check her HR and BP during these spells to try to correlate if HR or BP is low during these episodes.   For now will continue current dose of beta blocker.   Followup with me in 6 months  Signed, Fransico Him, MD 12/27/2013 10:18 AM

## 2014-01-17 ENCOUNTER — Encounter: Payer: Self-pay | Admitting: Cardiology

## 2014-01-19 ENCOUNTER — Ambulatory Visit (INDEPENDENT_AMBULATORY_CARE_PROVIDER_SITE_OTHER): Payer: Medicare Other

## 2014-01-19 DIAGNOSIS — I4891 Unspecified atrial fibrillation: Secondary | ICD-10-CM

## 2014-01-19 LAB — POCT INR: INR: 2.4

## 2014-03-02 ENCOUNTER — Ambulatory Visit (INDEPENDENT_AMBULATORY_CARE_PROVIDER_SITE_OTHER): Payer: Medicare Other | Admitting: Pharmacist

## 2014-03-02 DIAGNOSIS — I4891 Unspecified atrial fibrillation: Secondary | ICD-10-CM

## 2014-03-02 LAB — POCT INR: INR: 2.6

## 2014-04-10 ENCOUNTER — Other Ambulatory Visit: Payer: Self-pay | Admitting: Internal Medicine

## 2014-04-13 ENCOUNTER — Ambulatory Visit (INDEPENDENT_AMBULATORY_CARE_PROVIDER_SITE_OTHER): Payer: Medicare Other | Admitting: Pharmacist

## 2014-04-13 DIAGNOSIS — I4891 Unspecified atrial fibrillation: Secondary | ICD-10-CM

## 2014-04-13 LAB — POCT INR: INR: 2.1

## 2014-04-25 ENCOUNTER — Encounter: Payer: Self-pay | Admitting: Internal Medicine

## 2014-04-25 ENCOUNTER — Ambulatory Visit (INDEPENDENT_AMBULATORY_CARE_PROVIDER_SITE_OTHER): Payer: Medicare Other | Admitting: Internal Medicine

## 2014-04-25 VITALS — BP 156/82 | HR 56 | Ht 63.0 in | Wt 148.0 lb

## 2014-04-25 DIAGNOSIS — I10 Essential (primary) hypertension: Secondary | ICD-10-CM

## 2014-04-25 DIAGNOSIS — I480 Paroxysmal atrial fibrillation: Secondary | ICD-10-CM

## 2014-04-25 DIAGNOSIS — I4891 Unspecified atrial fibrillation: Secondary | ICD-10-CM

## 2014-04-25 MED ORDER — LOSARTAN POTASSIUM 50 MG PO TABS: 50.0000 mg | ORAL_TABLET | Freq: Every day | ORAL | Status: DC

## 2014-04-25 NOTE — Progress Notes (Signed)
PCP: Monica Naas, MD Primary Cardiologist:  Dr Verdie Shire is a 73 y.o. female who presents today for routine electrophysiology followup. She has rare palpitations which are short lived.  Her quality of life is much improved post ablation.  She thinks that she may be having afib for about an hour every week or so. Today, she denies symptoms of chest pain, shortness of breath,  lower extremity edema, presyncope, or syncope.  The patient is otherwise without complaint today.   Past Medical History  Diagnosis Date  . Normal nuclear stress test 2008    low risk  . Osteoporosis   . 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 Paroxysmal atrial fibrillation     s/p ablation 10/13  . GERD (gastroesophageal reflux disease)   . Hypercholesteremia   . Kidney stone 6/11    rt. on ct 6/11  . Hypertension     [facility]   Past Surgical History  Procedure Laterality Date  . Cholecystectomy  1989  . Cesarean section  1978-1979    x-2  . Cardioversion  11/10/2011    Procedure: CARDIOVERSION;  Surgeon: Sueanne Margarita, MD;  Location: [facility];  Service: Cardiovascular;  Laterality: N/A;  . Cardioversion  05/06/2012    Procedure: CARDIOVERSION;  Surgeon: Sueanne Margarita, MD;  Location: [facility];  Service: Cardiovascular;  Laterality: N/A;  h/p in file drawer  . Tee without cardioversion  06/06/2012    Procedure: TRANSESOPHAGEAL ECHOCARDIOGRAM (TEE);  Surgeon: Jettie Booze, MD;  Location: [facility] ENDOSCOPY;  Service: Cardiovascular;  Laterality: N/A;  . Atrial fibrillation and atrial flutter ablation  06/07/12    PVI and CTI ablation by Dr Rayann Heman    Current Outpatient Prescriptions  Medication Sig Dispense Refill  . acetaminophen (TYLENOL) 500 MG tablet Take 500 mg by mouth every 6 (six) hours as needed. For pain      . calcium carbonate (OS-CAL) 600 MG TABS tablet Take 600 mg by mouth 2 (two) times daily with a meal.       . cholecalciferol (VITAMIN D) 1000 UNITS tablet Take 1,000  Units by mouth every evening.       Marland Kitchen losartan (COZAAR) 50 MG tablet Take 1 tablet (50 mg total) by mouth daily.  90 tablet  3  . metoprolol tartrate (LOPRESSOR) 25 MG tablet TAKE 1 & 1/2 TABLETS BY MOUTH TWICE A DAY  270 tablet  2  . omeprazole (PRILOSEC) 20 MG capsule Take 20 mg by mouth daily.      . pravastatin (PRAVACHOL) 20 MG tablet Take 20 mg by mouth daily.      Marland Kitchen warfarin (COUMADIN) 5 MG tablet Take as directed by the coumadin clinic      . zoledronic acid (RECLAST) 5 MG/100ML SOLN As directed once a yr osteoporosis       No current facility-administered medications for this visit.   ROS- all systems are reviewed and negative except as per HPI above  Physical Exam: Filed Vitals:   04/25/14 1004  BP: 156/82  Pulse: 56  Height: 5\' 3"  (1.6 m)  Weight: 148 lb (67.132 kg)    GEN- The patient is well appearing, alert and oriented x 3 today.   Head- normocephalic, atraumatic Eyes-  Sclera clear, conjunctiva pink Ears- hearing intact Oropharynx- clear Lungs- Clear to ausculation bilaterally, normal work of breathing Heart- Regular rate and rhythm, no murmurs, rubs or gallops, PMI not laterally displaced GI- soft, NT, ND, +  BS Extremities- no clubbing, cyanosis, or edema  ekg today reveals sinus rhythm 54 bpm,  otherwise normal ekg  Assessment and Plan:  1. afib Much improved post ablation off of AAD therapy.  Today, I have discussed implantable loop recorder as an option to further characterize her palpitations to see if she is having additional afib or if she is having other findings (such as PACs).  Risks, benefits, and alternatives to implantable loop recorder were discussed with the patient who wishes to proceed.  She is also interested in RIO II.  Once this study is up and running, we will proceed. Continue anticoagulation  (CHADS2VASC score is 3)   2. HTN Elevated Increase losartan to 50mg  daily  Return in 6 weeks unless we are able to implant ILR sooner

## 2014-04-25 NOTE — Patient Instructions (Signed)
Your physician recommends that you schedule a follow-up appointment in: 6 weeks with Dr Rayann Heman  Your physician has recommended you make the following change in your medication:  1) Increase Losartan to 50mg  daily

## 2014-05-18 ENCOUNTER — Telehealth: Payer: Self-pay | Admitting: *Deleted

## 2014-05-18 NOTE — Telephone Encounter (Signed)
RIO 2 Informed Consent   Subject Name: Monica [name]  Subject met inclusion and exclusion criteria.  The informed consent form, study requirements and expectations were reviewed with the subject and questions and concerns were addressed prior to the signing of the consent form.  The subject verbalized understanding of the trail requirements.  The subject agreed to participate in the RIO 2 trial and signed the informed consent.  The informed consent was obtained prior to performance of any protocol-specific procedures for the subject.  A copy of the signed informed consent was given to the subject and a copy was placed in the subject's medical record.  Raquel Sarna Hydia Copelin 05/18/2014

## 2014-05-22 ENCOUNTER — Other Ambulatory Visit: Payer: Self-pay | Admitting: *Deleted

## 2014-05-23 ENCOUNTER — Other Ambulatory Visit: Payer: Self-pay | Admitting: *Deleted

## 2014-05-24 MED ORDER — WARFARIN SODIUM 5 MG PO TABS: ORAL_TABLET | ORAL | Status: DC

## 2014-05-25 ENCOUNTER — Ambulatory Visit (INDEPENDENT_AMBULATORY_CARE_PROVIDER_SITE_OTHER): Payer: Medicare Other | Admitting: Pharmacist

## 2014-05-25 DIAGNOSIS — I480 Paroxysmal atrial fibrillation: Secondary | ICD-10-CM

## 2014-05-25 LAB — POCT INR: INR: 3.3

## 2014-05-30 ENCOUNTER — Encounter: Payer: Self-pay | Admitting: Internal Medicine

## 2014-05-30 ENCOUNTER — Encounter (INDEPENDENT_AMBULATORY_CARE_PROVIDER_SITE_OTHER): Payer: Medicare Other

## 2014-05-30 ENCOUNTER — Ambulatory Visit (INDEPENDENT_AMBULATORY_CARE_PROVIDER_SITE_OTHER): Payer: Medicare Other | Admitting: Internal Medicine

## 2014-05-30 VITALS — BP 152/80 | HR 54 | Ht 63.0 in | Wt 148.8 lb

## 2014-05-30 DIAGNOSIS — R002 Palpitations: Secondary | ICD-10-CM

## 2014-05-30 DIAGNOSIS — I480 Paroxysmal atrial fibrillation: Secondary | ICD-10-CM

## 2014-05-30 HISTORY — DX: Palpitations: R00.2

## 2014-05-30 HISTORY — PX: OTHER SURGICAL HISTORY: SHX169

## 2014-05-30 NOTE — Patient Instructions (Addendum)
Your physician recommends that you schedule a follow-up appointment in: 10 days in device clinic for wound check and 1 month follow up with Dr Rayann Heman and research

## 2014-05-30 NOTE — Progress Notes (Addendum)
SURGEON:  Thompson Grayer, MD     PREPROCEDURE DIAGNOSIS:  Atrial fibrillation, palpitations    POSTPROCEDURE DIAGNOSIS:  Atrial fibrillation, palpitations     PROCEDURES:   1. Implantable loop recorder implantation    INTRODUCTION:  Monica [name] is a 73 y.o. female with a history of palpitations and atrial fibrillation who presents today for implantable loop implantation.  The patient has had difficult to manage atrial fibrillation with frequent palpitations.   The patient is also s/p prior atrial fibrillation ablation.   she has worn telemetry previously during which she did not have arrhythmias.  There is significant concern for possible atrial fibrillation as the cause for the palpitations.  In addition long term afib management is felt to require additional monitoring.  The patient therefore presents today for implantable loop implantation.  She is implanted via RIO II study protocol in the ambulatory setting.    DESCRIPTION OF PROCEDURE:  Informed written consent was obtained.  The patient required no sedation for the procedure today.  The patient was prepped and draped in a standared fashion.  Mapping over the patient's chest was performed to identify the area where electrograms were most prominent for ILR recording.  This area was found to be the left parasternal region over the 3rd-4th intercostal space. The patients left chest was therefore prepped and draped in the usual sterile fashion. The skin overlying the left parasternal region was infiltrated with lidocaine for local analgesia.  A 0.5-cm incision was made over the left parasternal region over the 3rd intercostal space.  A subcutaneous ILR pocket was fashioned using a combination of sharp and blunt dissection.  A Medtronic Reveal [place] model [id number] implantable loop recorder was then placed into the pocket  R waves were very prominent and measured 0.88 mV.  Steri- Strips and a sterile dressing were then applied.  There were  no early apparent complications.     CONCLUSIONS:   1. Successful implantation of a Medtronic Reveal LINQ implantable loop recorder for palpitations and recurrent symptoms of atrial fibrillation  2. No early apparent complications.

## 2014-06-06 ENCOUNTER — Encounter: Payer: Self-pay | Admitting: Internal Medicine

## 2014-06-11 ENCOUNTER — Ambulatory Visit (INDEPENDENT_AMBULATORY_CARE_PROVIDER_SITE_OTHER): Payer: Medicare Other | Admitting: *Deleted

## 2014-06-11 ENCOUNTER — Encounter: Payer: Self-pay | Admitting: Internal Medicine

## 2014-06-11 DIAGNOSIS — I480 Paroxysmal atrial fibrillation: Secondary | ICD-10-CM

## 2014-06-11 LAB — MDC_IDC_ENUM_SESS_TYPE_INCLINIC

## 2014-06-11 NOTE — Progress Notes (Signed)
Wound check-ILR.  Steri strips removed by the patient, wound well healed.  R-wave 0.35-0.38mV.  1 patient acitvated for A-fib and 12 A-fib episodes for a total burden of 1.6%, + coumadin.  ROV in November with Dr. Rayann Heman.

## 2014-06-15 ENCOUNTER — Ambulatory Visit (INDEPENDENT_AMBULATORY_CARE_PROVIDER_SITE_OTHER): Payer: Medicare Other | Admitting: *Deleted

## 2014-06-15 ENCOUNTER — Ambulatory Visit: Payer: Medicare Other | Admitting: Internal Medicine

## 2014-06-15 DIAGNOSIS — I4891 Unspecified atrial fibrillation: Secondary | ICD-10-CM

## 2014-06-15 LAB — POCT INR: INR: 2.5

## 2014-06-20 ENCOUNTER — Encounter: Payer: Self-pay | Admitting: Cardiology

## 2014-06-21 ENCOUNTER — Encounter: Payer: Self-pay | Admitting: Internal Medicine

## 2014-06-22 ENCOUNTER — Encounter: Payer: Self-pay | Admitting: Internal Medicine

## 2014-06-29 ENCOUNTER — Ambulatory Visit (INDEPENDENT_AMBULATORY_CARE_PROVIDER_SITE_OTHER): Payer: Medicare Other | Admitting: *Deleted

## 2014-06-29 ENCOUNTER — Encounter: Payer: Self-pay | Admitting: Internal Medicine

## 2014-06-29 DIAGNOSIS — I480 Paroxysmal atrial fibrillation: Secondary | ICD-10-CM

## 2014-06-29 LAB — MDC_IDC_ENUM_SESS_TYPE_REMOTE: MDC IDC SESS DTM: 20151114050500

## 2014-07-04 ENCOUNTER — Encounter: Payer: Self-pay | Admitting: Internal Medicine

## 2014-07-04 ENCOUNTER — Ambulatory Visit (INDEPENDENT_AMBULATORY_CARE_PROVIDER_SITE_OTHER): Payer: Medicare Other | Admitting: Internal Medicine

## 2014-07-04 ENCOUNTER — Ambulatory Visit (INDEPENDENT_AMBULATORY_CARE_PROVIDER_SITE_OTHER): Payer: Medicare Other | Admitting: *Deleted

## 2014-07-04 ENCOUNTER — Encounter: Payer: Self-pay | Admitting: *Deleted

## 2014-07-04 VITALS — BP 140/88 | HR 63 | Ht 63.0 in | Wt 149.0 lb

## 2014-07-04 DIAGNOSIS — I480 Paroxysmal atrial fibrillation: Secondary | ICD-10-CM

## 2014-07-04 DIAGNOSIS — I4891 Unspecified atrial fibrillation: Secondary | ICD-10-CM

## 2014-07-04 DIAGNOSIS — I4819 Other persistent atrial fibrillation: Secondary | ICD-10-CM

## 2014-07-04 DIAGNOSIS — I481 Persistent atrial fibrillation: Secondary | ICD-10-CM

## 2014-07-04 LAB — MDC_IDC_ENUM_SESS_TYPE_INCLINIC

## 2014-07-04 LAB — POCT INR: INR: 3

## 2014-07-04 NOTE — Progress Notes (Signed)
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 female who presents today for routine electrophysiology followup. ILR was implanted as part of the RIO ll clinical trial, 05/30/14, to further understand palpitations following ablation 06/07/12. Device interrogation reveals afib with 4.2% afib burden with longest episode 21 hours form which pt was symptomatic. Options were discussed today and pt prefers to pursue PVI. Has used flecainide and rhythmol in past without good results.  Today, she denies symptoms of chest pain, shortness of breath,  lower extremity edema, presyncope, or syncope.  The patient is otherwise without complaint today.   Past Medical History  Diagnosis Date  . Normal nuclear stress test 2008    low risk  . Osteoporosis   . 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 Paroxysmal atrial fibrillation     s/p ablation 10/13  . GERD (gastroesophageal reflux disease)   . Hypercholesteremia   . Kidney stone 6/11    rt. on ct 6/11  . Hypertension     [facility]   Past Surgical History  Procedure Laterality Date  . Cholecystectomy  1989  . Cesarean section  1978-1979    x-2  . Cardioversion  11/10/2011    Procedure: CARDIOVERSION;  Surgeon: Sueanne Margarita, MD;  Location: [facility];  Service: Cardiovascular;  Laterality: N/A;  . Cardioversion  05/06/2012    Procedure: CARDIOVERSION;  Surgeon: Sueanne Margarita, MD;  Location: [facility];  Service: Cardiovascular;  Laterality: N/A;  h/p in file drawer  . Tee without cardioversion  06/06/2012    Procedure: TRANSESOPHAGEAL ECHOCARDIOGRAM (TEE);  Surgeon: Jettie Booze, MD;  [facility] END[facility]COPY;  Service: Cardiovascular;  Laterality: N/A;  . Atrial fibrillation and atrial flutter ablation  06/07/12    PVI and CTI ablation by Dr Rayann Heman  . Implantable loop recorder implantation  05/30/14    Medtronic Reveal LINQ implanted by Dr Rayann Heman with RIO II protocol (ambulatory setting)     Current Outpatient Prescriptions  Medication Sig Dispense Refill  . acetaminophen (TYLENOL) 500 MG tablet Take 500 mg by mouth every 6 (six) hours as needed. For pain    . calcium carbonate (OS-CAL) 600 MG TABS tablet Take 600 mg by mouth 2 (two) times daily with a meal.     . cholecalciferol (VITAMIN D) 1000 UNITS tablet Take 1,000 Units by mouth every evening.     Marland Kitchen losartan (COZAAR) 50 MG tablet Take 1 tablet (50 mg total) by mouth daily. 90 tablet 3  . metoprolol tartrate (LOPRESSOR) 25 MG tablet TAKE 1 & 1/2 TABLETS BY MOUTH TWICE A DAY 270 tablet 2  . omeprazole (PRILOSEC) 20 MG capsule Take 20 mg by mouth daily.    . pravastatin (PRAVACHOL) 20 MG tablet Take 20 mg by mouth daily.    Marland Kitchen warfarin (COUMADIN) 5 MG tablet Take as directed by the coumadin clinic 30 tablet 3  . zoledronic acid (RECLAST) 5 MG/100ML SOLN As directed once a yr osteoporosis     No current facility-administered medications for this visit.   ROS- all systems are reviewed and negative except as per HPI above  Physical Exam: Filed Vitals:   07/04/14 1037  BP: 140/88  Pulse: 63  Height: 5\' 3"  (1.6 m)  Weight: 149 lb (67.586 kg)    GEN- The patient is well appearing, alert and oriented x 3 today.   Head- normocephalic, atraumatic Eyes-  Sclera clear, conjunctiva pink Ears- hearing intact  Oropharynx- clear Lungs- Clear to ausculation bilaterally, normal work of breathing Heart- Regular rate and rhythm, no murmurs, rubs or gallops, PMI not laterally displaced GI- soft, NT, ND, + BS Extremities- no clubbing, cyanosis, or edema  Interrogation of ILR as per HPI.  Assessment and Plan:  1. afib Recent ILR shows increasing afib burden Therapeutic strategies for afib including medicine and ablation were discussed in detail with the patient today. Risk, benefits, and alternatives to EP study and radiofrequency ablation for afib were also discussed in detail today. These risks include but are not limited to  stroke, bleeding, vascular damage, tamponade, perforation, damage to the esophagus, lungs, and other structures, pulmonary vein stenosis, worsening renal function, and death. The patient understands these risk and wishes to proceed. Continue anticoagulation with warfarin  (CHADS2VASC score is 3)   2. HTN Stable  No changes

## 2014-07-04 NOTE — Progress Notes (Signed)
Loop recorder 

## 2014-07-04 NOTE — Patient Instructions (Signed)
Your physician has recommended that you have an ablation. Catheter ablation is a medical procedure used to treat some cardiac arrhythmias (irregular heartbeats). During catheter ablation, a long, thin, flexible tube is put into a blood vessel in your groin (upper thigh), or neck. This tube is called an ablation catheter. It is then guided to your heart through the blood vessel. Radio frequency waves destroy small areas of heart tissue where abnormal heartbeats may cause an arrhythmia to start. Please see the instruction sheet given to you today.   See instruction sheet for procedure 

## 2014-07-11 ENCOUNTER — Ambulatory Visit (INDEPENDENT_AMBULATORY_CARE_PROVIDER_SITE_OTHER): Payer: Medicare Other | Admitting: *Deleted

## 2014-07-11 DIAGNOSIS — I4891 Unspecified atrial fibrillation: Secondary | ICD-10-CM

## 2014-07-11 LAB — POCT INR: INR: 3

## 2014-07-18 ENCOUNTER — Ambulatory Visit (INDEPENDENT_AMBULATORY_CARE_PROVIDER_SITE_OTHER): Payer: Medicare Other | Admitting: Pharmacist

## 2014-07-18 DIAGNOSIS — I4891 Unspecified atrial fibrillation: Secondary | ICD-10-CM

## 2014-07-18 LAB — POCT INR: INR: 2.8

## 2014-07-19 ENCOUNTER — Encounter: Payer: Self-pay | Admitting: Internal Medicine

## 2014-07-23 ENCOUNTER — Encounter: Payer: Self-pay | Admitting: Internal Medicine

## 2014-07-23 ENCOUNTER — Telehealth: Payer: Self-pay | Admitting: Cardiology

## 2014-07-23 NOTE — Telephone Encounter (Signed)
New Message  Darla from [facility] is asking for a CPT; please call back and discuss.

## 2014-07-24 ENCOUNTER — Ambulatory Visit (INDEPENDENT_AMBULATORY_CARE_PROVIDER_SITE_OTHER): Payer: Medicare Other | Admitting: Pharmacist

## 2014-07-24 ENCOUNTER — Other Ambulatory Visit (INDEPENDENT_AMBULATORY_CARE_PROVIDER_SITE_OTHER): Payer: Medicare Other | Admitting: *Deleted

## 2014-07-24 DIAGNOSIS — I4891 Unspecified atrial fibrillation: Secondary | ICD-10-CM

## 2014-07-24 DIAGNOSIS — I4819 Other persistent atrial fibrillation: Secondary | ICD-10-CM

## 2014-07-24 DIAGNOSIS — I481 Persistent atrial fibrillation: Secondary | ICD-10-CM

## 2014-07-24 LAB — POCT INR: INR: 3

## 2014-07-24 LAB — CBC WITH DIFFERENTIAL/PLATELET
BASOS PCT: 0.6 % (ref 0.0–3.0)
Basophils Absolute: 0 10*3/uL (ref 0.0–0.1)
Eosinophils Absolute: 0.1 10*3/uL (ref 0.0–0.7)
Eosinophils Relative: 1.4 % (ref 0.0–5.0)
HEMATOCRIT: 37.3 % (ref 36.0–46.0)
HEMOGLOBIN: 11.8 g/dL — AB (ref 12.0–15.0)
Lymphocytes Relative: 39.1 % (ref 12.0–46.0)
Lymphs Abs: 2.3 10*3/uL (ref 0.7–4.0)
MCHC: 31.7 g/dL (ref 30.0–36.0)
MCV: 84.2 fl (ref 78.0–100.0)
MONO ABS: 0.6 10*3/uL (ref 0.1–1.0)
Monocytes Relative: 9.4 % (ref 3.0–12.0)
NEUTROS ABS: 3 10*3/uL (ref 1.4–7.7)
NEUTROS PCT: 49.5 % (ref 43.0–77.0)
Platelets: 265 10*3/uL (ref 150.0–400.0)
RBC: 4.43 Mil/uL (ref 3.87–5.11)
RDW: 15.9 % — ABNORMAL HIGH (ref 11.5–15.5)
WBC: 6 10*3/uL (ref 4.0–10.5)

## 2014-07-24 LAB — BASIC METABOLIC PANEL
BUN: 19 mg/dL (ref 6–23)
CHLORIDE: 105 meq/L (ref 96–112)
CO2: 26 meq/L (ref 19–32)
CREATININE: 0.9 mg/dL (ref 0.4–1.2)
Calcium: 9.2 mg/dL (ref 8.4–10.5)
GFR: 64.98 mL/min (ref >60.00)
Glucose, Bld: 90 mg/dL (ref 70–99)
Potassium: 4.1 mEq/L (ref 3.5–5.1)
Sodium: 138 mEq/L (ref 135–145)

## 2014-07-26 ENCOUNTER — Encounter (HOSPITAL_COMMUNITY): Payer: Self-pay | Admitting: Internal Medicine

## 2014-07-27 ENCOUNTER — Encounter: Payer: Self-pay | Admitting: Internal Medicine

## 2014-07-29 ENCOUNTER — Encounter: Payer: Self-pay | Admitting: Internal Medicine

## 2014-07-30 ENCOUNTER — Ambulatory Visit (HOSPITAL_COMMUNITY)
Admission: RE | Admit: 2014-07-30 | Discharge: 2014-07-30 | Disposition: A | Discharge status: Home / Self Care | Payer: Medicare Other | Source: Ambulatory Visit | Attending: Cardiology | Admitting: Cardiology

## 2014-07-30 ENCOUNTER — Encounter (HOSPITAL_COMMUNITY): Payer: Self-pay | Admitting: *Deleted

## 2014-07-30 ENCOUNTER — Encounter (HOSPITAL_COMMUNITY)
Admission: RE | Disposition: A | Discharge status: Home / Self Care | Payer: Self-pay | Source: Ambulatory Visit | Attending: Cardiology

## 2014-07-30 ENCOUNTER — Ambulatory Visit (INDEPENDENT_AMBULATORY_CARE_PROVIDER_SITE_OTHER): Payer: Medicare Other | Admitting: *Deleted

## 2014-07-30 ENCOUNTER — Encounter: Payer: Self-pay | Admitting: Internal Medicine

## 2014-07-30 DIAGNOSIS — I4891 Unspecified atrial fibrillation: Secondary | ICD-10-CM | POA: Diagnosis not present

## 2014-07-30 DIAGNOSIS — I4819 Other persistent atrial fibrillation: Secondary | ICD-10-CM

## 2014-07-30 DIAGNOSIS — E780 Pure hypercholesterolemia: Secondary | ICD-10-CM | POA: Insufficient documentation

## 2014-07-30 DIAGNOSIS — M810 Age-related osteoporosis without current pathological fracture: Secondary | ICD-10-CM | POA: Diagnosis not present

## 2014-07-30 DIAGNOSIS — I10 Essential (primary) hypertension: Secondary | ICD-10-CM | POA: Insufficient documentation

## 2014-07-30 DIAGNOSIS — I481 Persistent atrial fibrillation: Secondary | ICD-10-CM

## 2014-07-30 DIAGNOSIS — Z7901 Long term (current) use of anticoagulants: Secondary | ICD-10-CM | POA: Diagnosis not present

## 2014-07-30 DIAGNOSIS — K219 Gastro-esophageal reflux disease without esophagitis: Secondary | ICD-10-CM | POA: Insufficient documentation

## 2014-07-30 HISTORY — PX: TEE WITHOUT CARDIOVERSION: SHX5443

## 2014-07-30 SURGERY — ECHOCARDIOGRAM, TRANSESOPHAGEAL
Anesthesia: Moderate Sedation

## 2014-07-30 MED ORDER — FENTANYL CITRATE 0.05 MG/ML IJ SOLN
INTRAMUSCULAR | Status: DC | PRN
  Administered 2014-07-30 (×2): 25 ug via INTRAVENOUS

## 2014-07-30 MED ORDER — DIPHENHYDRAMINE HCL 50 MG/ML IJ SOLN
INTRAMUSCULAR | Status: AC
  Filled 2014-07-30: qty 1

## 2014-07-30 MED ORDER — MIDAZOLAM HCL 5 MG/ML IJ SOLN
INTRAMUSCULAR | Status: AC
  Filled 2014-07-30: qty 2

## 2014-07-30 MED ORDER — FENTANYL CITRATE 0.05 MG/ML IJ SOLN
INTRAMUSCULAR | Status: AC
  Filled 2014-07-30: qty 2

## 2014-07-30 MED ORDER — SODIUM CHLORIDE 0.9 % IV SOLN
INTRAVENOUS | Status: DC
  Administered 2014-07-30: 500 mL via INTRAVENOUS

## 2014-07-30 MED ORDER — BUTAMBEN-TETRACAINE-BENZOCAINE 2-2-14 % EX AERO
INHALATION_SPRAY | CUTANEOUS | Status: DC | PRN
  Administered 2014-07-30: 2 via TOPICAL

## 2014-07-30 MED ORDER — MIDAZOLAM HCL 10 MG/2ML IJ SOLN
INTRAMUSCULAR | Status: DC | PRN
  Administered 2014-07-30 (×2): 2 mg via INTRAVENOUS

## 2014-07-30 NOTE — CV Procedure (Signed)
Procedure: TEE  Indication: Pre-atrial fibrillation ablation.   Sedation: Versed 4 mg IV, Fentanyl 50 mcg IV  Findings: Normal LV size and systolic function, EF 81-38%.  Normal RV size and systolic function.  Normal mitral regurgitation.  Normal aortic valve.  Negative bubble study.  Moderate left atrial enlargement with no LAA thrombus.  Minimal descending aorta plaque.   Loralie Champagne 07/30/2014 10:28 AM

## 2014-07-30 NOTE — Discharge Instructions (Signed)
Transesophageal Echocardiogram °Transesophageal echocardiography (TEE) is a picture test of your heart using sound waves. The pictures taken can give very detailed pictures of your heart. This can help your doctor see if there are problems with your heart. TEE can check: °· If your heart has blood clots in it. °· How well your heart valves are working. °· If you have an infection on the inside of your heart. °· Some of the major arteries of your heart. °· If your heart valve is working after a repair. °· Your heart before a procedure that uses a shock to your heart to get the rhythm back to normal. °BEFORE THE PROCEDURE °· Do not eat or drink for 6 hours before the procedure or as told by your doctor. °· Make plans to have someone drive you home after the procedure. Do not drive yourself home. °· An IV tube will be put in your arm. °PROCEDURE °· You will be given a medicine to help you relax (sedative). It will be given through the IV tube. °· A numbing medicine will be sprayed or gargled in the back of your throat to help numb it. °· The tip of the probe is placed into the back of your mouth. You will be asked to swallow. This helps to pass the probe into your esophagus. °· Once the tip of the probe is in the right place, your doctor can take pictures of your heart. °· You may feel pressure at the back of your throat. °AFTER THE PROCEDURE °· You will be taken to a recovery area so the sedative can wear off. °· Your throat may be sore and scratchy. This will go away slowly over time. °· You will go home when you are fully awake and able to swallow liquids. °· You should have someone stay with you for the next 24 hours. °· Do not drive or operate machinery for the next 24 hours. °Document Released: 05/31/2009 Document Revised: 08/08/2013 Document Reviewed: 02/02/2013 °Exit[employer]® Patient Information ©2015 [employer]. This information is not intended to replace advice given to you by your health care provider. Make  sure you discuss any questions you have with your health care provider. ° ° °Conscious Sedation, Adult, Care After °Refer to this sheet in the next few weeks. These instructions provide you with information on caring for yourself after your procedure. Your health care provider may also give you more specific instructions. Your treatment has been planned according to current medical practices, but problems sometimes occur. Call your health care provider if you have any problems or questions after your procedure. °WHAT TO EXPECT AFTER THE PROCEDURE  °After your procedure: °· You may feel sleepy, clumsy, and have poor balance for several hours. °· Vomiting may occur if you eat too soon after the procedure. °HOME CARE INSTRUCTIONS °· Do not participate in any activities where you could become injured for at least 24 hours. Do not: °¨ Drive. °¨ Swim. °¨ Ride a bicycle. °¨ Operate heavy machinery. °¨ Cook. °¨ Use power tools. °¨ Climb ladders. °¨ Work from a high place. °· Do not make important decisions or sign legal documents until you are improved. °· If you vomit, drink water, juice, or soup when you can drink without vomiting. Make sure you have little or no nausea before eating solid foods. °· Only take over-the-counter or prescription medicines for pain, discomfort, or fever as directed by your health care provider. °· Make sure you and your family fully understand everything about   the medicines given to you, including what side effects may occur. °· You should not drink alcohol, take sleeping pills, or take medicines that cause drowsiness for at least 24 hours. °· If you smoke, do not smoke without supervision. °· If you are feeling better, you may resume normal activities 24 hours after you were sedated. °· Keep all appointments with your health care provider. °SEEK MEDICAL CARE IF: °· Your skin is pale or bluish in color. °· You continue to feel nauseous or vomit. °· Your pain is getting worse and is not helped  by medicine. °· You have bleeding or swelling. °· You are still sleepy or feeling clumsy after 24 hours. °SEEK IMMEDIATE MEDICAL CARE IF: °· You develop a rash. °· You have difficulty breathing. °· You develop any type of allergic problem. °· You have a fever. °MAKE SURE YOU: °· Understand these instructions. °· Will watch your condition. °· Will get help right away if you are not doing well or get worse. °Document Released: 05/24/2013 Document Reviewed: 05/24/2013 °Exit[employer]® Patient Information ©2015 [employer]. This information is not intended to replace advice given to you by your health care provider. Make sure you discuss any questions you have with your health care provider. ° °

## 2014-07-30 NOTE — Progress Notes (Signed)
Echocardiogram Echocardiogram Transesophageal has been performed.  Monica [name] 07/30/2014, 10:58 AM

## 2014-07-30 NOTE — H&P (View-Only) (Signed)
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 female who presents today for routine electrophysiology followup. ILR was implanted as part of the RIO ll clinical trial, 05/30/14, to further understand palpitations following ablation 06/07/12. Device interrogation reveals afib with 4.2% afib burden with longest episode 21 hours form which pt was symptomatic. Options were discussed today and pt prefers to pursue PVI. Has used flecainide and rhythmol in past without good results.  Today, she denies symptoms of chest pain, shortness of breath,  lower extremity edema, presyncope, or syncope.  The patient is otherwise without complaint today.   Past Medical History  Diagnosis Date  . Normal nuclear stress test 2008    low risk  . Osteoporosis   . 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 Paroxysmal atrial fibrillation     s/p ablation 10/13  . GERD (gastroesophageal reflux disease)   . Hypercholesteremia   . Kidney stone 6/11    rt. on ct 6/11  . Hypertension     [facility]   Past Surgical History  Procedure Laterality Date  . Cholecystectomy  1989  . Cesarean section  1978-1979    x-2  . Cardioversion  11/10/2011    Procedure: CARDIOVERSION;  Surgeon: Sueanne Margarita, MD;  Location: [facility];  Service: Cardiovascular;  Laterality: N/A;  . Cardioversion  05/06/2012    Procedure: CARDIOVERSION;  Surgeon: Sueanne Margarita, MD;  Location: [facility];  Service: Cardiovascular;  Laterality: N/A;  h/p in file drawer  . Tee without cardioversion  06/06/2012    Procedure: TRANSESOPHAGEAL ECHOCARDIOGRAM (TEE);  Surgeon: Jettie Booze, MD;  Location: [facility];  Service: Cardiovascular;  Laterality: N/A;  . Atrial fibrillation and atrial flutter ablation  06/07/12    PVI and CTI ablation by Dr Rayann Heman  . Implantable loop recorder implantation  05/30/14    Medtronic Reveal LINQ implanted by Dr Rayann Heman with RIO II protocol (ambulatory setting)     Current Outpatient Prescriptions  Medication Sig Dispense Refill  . acetaminophen (TYLENOL) 500 MG tablet Take 500 mg by mouth every 6 (six) hours as needed. For pain    . calcium carbonate (OS-CAL) 600 MG TABS tablet Take 600 mg by mouth 2 (two) times daily with a meal.     . cholecalciferol (VITAMIN D) 1000 UNITS tablet Take 1,000 Units by mouth every evening.     Marland Kitchen losartan (COZAAR) 50 MG tablet Take 1 tablet (50 mg total) by mouth daily. 90 tablet 3  . metoprolol tartrate (LOPRESSOR) 25 MG tablet TAKE 1 & 1/2 TABLETS BY MOUTH TWICE A DAY 270 tablet 2  . omeprazole (PRILOSEC) 20 MG capsule Take 20 mg by mouth daily.    . pravastatin (PRAVACHOL) 20 MG tablet Take 20 mg by mouth daily.    Marland Kitchen warfarin (COUMADIN) 5 MG tablet Take as directed by the coumadin clinic 30 tablet 3  . zoledronic acid (RECLAST) 5 MG/100ML SOLN As directed once a yr osteoporosis     No current facility-administered medications for this visit.   ROS- all systems are reviewed and negative except as per HPI above  Physical Exam: Filed Vitals:   07/04/14 1037  BP: 140/88  Pulse: 63  Height: 5\' 3"  (1.6 m)  Weight: 149 lb (67.586 kg)    GEN- The patient is well appearing, alert and oriented x 3 today.   Head- normocephalic, atraumatic Eyes-  Sclera clear, conjunctiva pink Ears- hearing intact  Oropharynx- clear Lungs- Clear to ausculation bilaterally, normal work of breathing Heart- Regular rate and rhythm, no murmurs, rubs or gallops, PMI not laterally displaced GI- soft, NT, ND, + BS Extremities- no clubbing, cyanosis, or edema  Interrogation of ILR as per HPI.  Assessment and Plan:  1. afib Recent ILR shows increasing afib burden Therapeutic strategies for afib including medicine and ablation were discussed in detail with the patient today. Risk, benefits, and alternatives to EP study and radiofrequency ablation for afib were also discussed in detail today. These risks include but are not limited to  stroke, bleeding, vascular damage, tamponade, perforation, damage to the esophagus, lungs, and other structures, pulmonary vein stenosis, worsening renal function, and death. The patient understands these risk and wishes to proceed. Continue anticoagulation with warfarin  (CHADS2VASC score is 3)   2. HTN Stable  No changes

## 2014-07-30 NOTE — Interval H&P Note (Signed)
History and Physical Interval Note:  07/30/2014 10:11 AM  Monica [name]  has presented today for surgery, with the diagnosis of afib  The various methods of treatment have been discussed with the patient and family. After consideration of risks, benefits and other options for treatment, the patient has consented to  Procedure(s): TRANSESOPHAGEAL ECHOCARDIOGRAM (TEE) (N/A) as a surgical intervention .  The patient's history has been reviewed, patient examined, no change in status, stable for surgery.  I have reviewed the patient's chart and labs.  Questions were answered to the patient's satisfaction.     Monica [name]

## 2014-07-31 ENCOUNTER — Ambulatory Visit (HOSPITAL_COMMUNITY): Payer: Medicare Other | Admitting: Anesthesiology

## 2014-07-31 ENCOUNTER — Encounter (HOSPITAL_COMMUNITY)
Admission: RE | Disposition: A | Discharge status: Home / Self Care | Payer: Self-pay | Source: Ambulatory Visit | Attending: Internal Medicine

## 2014-07-31 ENCOUNTER — Encounter (HOSPITAL_COMMUNITY): Payer: Self-pay | Admitting: Cardiology

## 2014-07-31 ENCOUNTER — Ambulatory Visit (HOSPITAL_COMMUNITY)
Admission: RE | Admit: 2014-07-31 | Discharge: 2014-08-01 | Disposition: A | Discharge status: Home / Self Care | Payer: Medicare Other | Source: Ambulatory Visit | Attending: Internal Medicine | Admitting: Internal Medicine

## 2014-07-31 DIAGNOSIS — K219 Gastro-esophageal reflux disease without esophagitis: Secondary | ICD-10-CM | POA: Diagnosis not present

## 2014-07-31 DIAGNOSIS — I10 Essential (primary) hypertension: Secondary | ICD-10-CM | POA: Diagnosis not present

## 2014-07-31 DIAGNOSIS — Z7901 Long term (current) use of anticoagulants: Secondary | ICD-10-CM | POA: Diagnosis not present

## 2014-07-31 DIAGNOSIS — Z79899 Other long term (current) drug therapy: Secondary | ICD-10-CM | POA: Insufficient documentation

## 2014-07-31 DIAGNOSIS — E780 Pure hypercholesterolemia: Secondary | ICD-10-CM | POA: Insufficient documentation

## 2014-07-31 DIAGNOSIS — E785 Hyperlipidemia, unspecified: Secondary | ICD-10-CM | POA: Diagnosis not present

## 2014-07-31 DIAGNOSIS — I4892 Unspecified atrial flutter: Secondary | ICD-10-CM | POA: Diagnosis present

## 2014-07-31 DIAGNOSIS — I4891 Unspecified atrial fibrillation: Secondary | ICD-10-CM | POA: Diagnosis present

## 2014-07-31 DIAGNOSIS — M810 Age-related osteoporosis without current pathological fracture: Secondary | ICD-10-CM | POA: Diagnosis not present

## 2014-07-31 DIAGNOSIS — D3500 Benign neoplasm of unspecified adrenal gland: Secondary | ICD-10-CM | POA: Diagnosis not present

## 2014-07-31 DIAGNOSIS — I480 Paroxysmal atrial fibrillation: Secondary | ICD-10-CM

## 2014-07-31 HISTORY — PX: ATRIAL FIBRILLATION ABLATION: SHX5456

## 2014-07-31 LAB — PROTIME-INR
INR: 1.99 — ABNORMAL HIGH (ref 0.00–1.49)
Prothrombin Time: 22.8 seconds — ABNORMAL HIGH (ref 11.6–15.2)

## 2014-07-31 LAB — POCT ACTIVATED CLOTTING TIME
ACTIVATED CLOTTING TIME: 380 s
Activated Clotting Time: 404 seconds

## 2014-07-31 LAB — MRSA PCR SCREENING: MRSA by PCR: NEGATIVE

## 2014-07-31 SURGERY — ATRIAL FIBRILLATION ABLATION
Anesthesia: General

## 2014-07-31 MED ORDER — SODIUM CHLORIDE 0.9 % IV SOLN: 250.0000 mL | INTRAVENOUS | Status: DC | PRN

## 2014-07-31 MED ORDER — SODIUM CHLORIDE 0.9 % IV SOLN
INTRAVENOUS | Status: DC | PRN
  Administered 2014-07-31 (×2): via INTRAVENOUS

## 2014-07-31 MED ORDER — HEPARIN SODIUM (PORCINE) 1000 UNIT/ML IJ SOLN
INTRAMUSCULAR | Status: DC | PRN
  Administered 2014-07-31: 10000 [IU] via INTRAVENOUS

## 2014-07-31 MED ORDER — ADENOSINE 6 MG/2ML IV SOLN
INTRAVENOUS | Status: AC
  Filled 2014-07-31: qty 2

## 2014-07-31 MED ORDER — GLYCOPYRROLATE 0.2 MG/ML IJ SOLN
INTRAMUSCULAR | Status: DC | PRN
  Administered 2014-07-31: 0.4 mg via INTRAVENOUS

## 2014-07-31 MED ORDER — HEPARIN SODIUM (PORCINE) 1000 UNIT/ML IJ SOLN
INTRAMUSCULAR | Status: AC
  Filled 2014-07-31: qty 1

## 2014-07-31 MED ORDER — FENTANYL CITRATE 0.05 MG/ML IJ SOLN
INTRAMUSCULAR | Status: DC | PRN
  Administered 2014-07-31 (×2): 50 ug via INTRAVENOUS

## 2014-07-31 MED ORDER — PROPOFOL 10 MG/ML IV BOLUS
INTRAVENOUS | Status: DC | PRN
  Administered 2014-07-31: 200 mg via INTRAVENOUS

## 2014-07-31 MED ORDER — BUPIVACAINE HCL (PF) 0.25 % IJ SOLN
INTRAMUSCULAR | Status: AC
  Filled 2014-07-31: qty 30

## 2014-07-31 MED ORDER — ACETAMINOPHEN 325 MG PO TABS: 650.0000 mg | ORAL_TABLET | ORAL | Status: DC | PRN

## 2014-07-31 MED ORDER — DOBUTAMINE IN D5W 4-5 MG/ML-% IV SOLN
INTRAVENOUS | Status: AC
  Filled 2014-07-31: qty 250

## 2014-07-31 MED ORDER — PROTAMINE SULFATE 10 MG/ML IV SOLN
INTRAVENOUS | Status: DC | PRN
  Administered 2014-07-31: 30 mg via INTRAVENOUS

## 2014-07-31 MED ORDER — DOBUTAMINE IN D5W 4-5 MG/ML-% IV SOLN
INTRAVENOUS | Status: DC | PRN
  Administered 2014-07-31: 20 ug/kg/min via INTRAVENOUS

## 2014-07-31 MED ORDER — DEXAMETHASONE SODIUM PHOSPHATE 4 MG/ML IJ SOLN
INTRAMUSCULAR | Status: DC | PRN
  Administered 2014-07-31: 4 mg via INTRAVENOUS

## 2014-07-31 MED ORDER — DOBUTAMINE IN D5W 4-5 MG/ML-% IV SOLN: INTRAVENOUS | Status: DC | PRN

## 2014-07-31 MED ORDER — SODIUM CHLORIDE 0.9 % IJ SOLN: 3.0000 mL | INTRAMUSCULAR | Status: DC | PRN

## 2014-07-31 MED ORDER — ROCURONIUM BROMIDE 100 MG/10ML IV SOLN
INTRAVENOUS | Status: DC | PRN
  Administered 2014-07-31: 25 mg via INTRAVENOUS

## 2014-07-31 MED ORDER — WARFARIN SODIUM 5 MG PO TABS
5.0000 mg | ORAL_TABLET | Freq: Every day | ORAL | Status: DC
  Administered 2014-07-31: 5 mg via ORAL
  Filled 2014-07-31 (×2): qty 1

## 2014-07-31 MED ORDER — ONDANSETRON HCL 4 MG/2ML IJ SOLN
INTRAMUSCULAR | Status: DC | PRN
  Administered 2014-07-31: 4 mg via INTRAVENOUS

## 2014-07-31 MED ORDER — LIDOCAINE HCL (CARDIAC) 20 MG/ML IV SOLN
INTRAVENOUS | Status: DC | PRN
  Administered 2014-07-31: 80 mg via INTRAVENOUS

## 2014-07-31 MED ORDER — SODIUM CHLORIDE 0.9 % IJ SOLN
3.0000 mL | Freq: Two times a day (BID) | INTRAMUSCULAR | Status: DC
  Administered 2014-07-31: 20:00:00 via INTRAVENOUS
  Administered 2014-07-31: 3 mL via INTRAVENOUS

## 2014-07-31 MED ORDER — EPHEDRINE SULFATE 50 MG/ML IJ SOLN
INTRAMUSCULAR | Status: DC | PRN
  Administered 2014-07-31: 5 mg via INTRAVENOUS

## 2014-07-31 MED ORDER — NEOSTIGMINE METHYLSULFATE 10 MG/10ML IV SOLN
INTRAVENOUS | Status: DC | PRN
  Administered 2014-07-31: 3 mg via INTRAVENOUS

## 2014-07-31 MED ORDER — WARFARIN - PHYSICIAN DOSING INPATIENT: Freq: Every day | Status: DC

## 2014-07-31 MED ORDER — ONDANSETRON HCL 4 MG/2ML IJ SOLN: 4.0000 mg | Freq: Four times a day (QID) | INTRAMUSCULAR | Status: DC | PRN

## 2014-07-31 MED ORDER — PANTOPRAZOLE SODIUM 40 MG PO TBEC
40.0000 mg | DELAYED_RELEASE_TABLET | Freq: Every day | ORAL | Status: DC
  Administered 2014-07-31: 40 mg via ORAL
  Filled 2014-07-31: qty 1

## 2014-07-31 MED ORDER — PHENYLEPHRINE HCL 10 MG/ML IJ SOLN
INTRAMUSCULAR | Status: DC | PRN
  Administered 2014-07-31 (×2): 40 ug via INTRAVENOUS

## 2014-07-31 MED ORDER — LACTATED RINGERS IV SOLN: INTRAVENOUS | Status: DC | PRN

## 2014-07-31 NOTE — Discharge Summary (Signed)
ELECTROPHYSIOLOGY PROCEDURE DISCHARGE SUMMARY    Patient ID: Monica [name],  MRN: [id number], DOB/AGE: 74-Jan-1942 74 y.o.  Admit date: 07/31/2014 Discharge date: 08/01/2014  Primary Care Physician: Reginia Naas, MD Primary Cardiologist: Radford Pax Electrophysiologist: Thompson Grayer, MD  Primary Discharge Diagnosis:  Paroxysmal atrial fibrillation status post ablation this admission  Secondary Discharge Diagnosis:  1.  Osteoporosis 2.  GERD 3.  Adrenal adenoma 4.  Hyperlipidemia 5.  Hypertension  Procedures This Admission:  1.  Electrophysiology study and radiofrequency catheter ablation on 07-31-14 by Dr Thompson Grayer.  This study demonstrated sinus rhythm upon presentation; rotational Angiography reveals a large sized left atrium with four separate pulmonary veins without evidence of pulmonary vein stenosis; the LSPV and LIPV were confirmed to be isolated from a prior ablation. There was trivial conduction within the RSPV and then was re-isolated today. The culpril PV appears to have been the RIPV. This vein was successfully reisolated today; Cavo-tricuspid isthmus ablation was confirmed from a prior ablation; CAFEs ablated along the posterior wall of the LA; left atrium flutter (CL 260 msec) was induced with doputamine. Unfortunately the tachycardia terminated during 3D mapping and could not be reinduced for ablation today There were no early apparent complications.   Brief HPI: Monica [name] is a 73 y.o. female with a history of atrial fibrillation status post ablation in 2013.  She did well initially and has recently developed recurrent symptomatic atrial fibrillation. Risks, benefits, and alternatives to catheter ablation of atrial fibrillation were reviewed with the patient who wished to proceed.  The patient underwent TEE prior to the procedure which demonstrated normal LV function and no LAA thrombus.    Hospital Course:  The patient was admitted and underwent  EPS/RFCA of atrial fibrillation with details as outlined above.  They were monitored on telemetry overnight which demonstrated sinus rhythm.  Groin was without complication on the day of discharge.  The patient was examined by Dr Rayann Heman and considered to be stable for discharge.  Wound care and restrictions were reviewed with the patient.  The patient will be seen back by Dr Rayann Heman in 12 weeks for post ablation follow up.   Discharge Vitals: Blood pressure 128/53, pulse 77, temperature 97.7 F (36.5 C), temperature source Oral, resp. rate 15, height 5\' 3"  (1.6 m), weight 147 lb 7.8 oz (66.9 kg), SpO2 95 %.   Physical Exam: Filed Vitals:   07/31/14 2000 08/01/14 0000 08/01/14 0400 08/01/14 0715  BP: 121/47 103/39 119/48 128/53  Pulse: 83 80 77   Temp:  98.3 F (36.8 C)  97.7 F (36.5 C)  TempSrc:  Oral  Oral  Resp: 20 14 12 15   Height:      Weight:      SpO2: 97% 95% 96% 95%    GEN- The patient is well appearing, alert and oriented x 3 today.   Head- normocephalic, atraumatic Eyes-  Sclera clear, conjunctiva pink Ears- hearing intact Oropharynx- clear Neck- supple, Lungs- Clear to ausculation bilaterally, normal work of breathing Heart- Regular rate and rhythm, no murmurs, rubs or gallops, PMI not laterally displaced GI- soft, NT, ND, + BS Extremities- no clubbing, cyanosis, or edema, groin is without hematoma/ bruit MS- no significant deformity or atrophy Skin- no rash or lesion Psych- euthymic mood, full affect Neuro- strength and sensation are intact   Labs:   Lab Results  Component Value Date   WBC 6.0 07/24/2014   HGB 11.8* 07/24/2014   HCT 37.3 07/24/2014   MCV 84.2  07/24/2014   PLT 265.0 07/24/2014     Recent Labs Lab 08/01/14 0229  NA 139  K 4.1  CL 104  CO2 24  BUN 14  CREATININE 0.74  CALCIUM 8.7  GLUCOSE 111*      Discharge Medications:    Medication List    TAKE these medications        acetaminophen 500 MG tablet  Commonly known as:   TYLENOL  Take 500 mg by mouth every 6 (six) hours as needed. For pain     calcium carbonate 600 MG Tabs tablet  Commonly known as:  OS-CAL  Take 600 mg by mouth 2 (two) times daily with a meal.     cholecalciferol 1000 UNITS tablet  Commonly known as:  VITAMIN D  Take 1,000 Units by mouth every evening.     losartan 50 MG tablet  Commonly known as:  COZAAR  Take 1 tablet (50 mg total) by mouth daily.     metoprolol tartrate 25 MG tablet  Commonly known as:  LOPRESSOR  TAKE 1 & 1/2 TABLETS BY MOUTH TWICE A DAY     omeprazole 20 MG capsule  Commonly known as:  PRILOSEC  Take 20 mg by mouth daily.     pravastatin 20 MG tablet  Commonly known as:  PRAVACHOL  Take 20 mg by mouth daily.     warfarin 5 MG tablet  Commonly known as:  COUMADIN  Take as directed by the coumadin clinic        Disposition:   Follow-up Information    Follow up with [facility] On 08/08/2014.   Why:  12 noon   Contact information:   [address] [place] [address]       Follow up with Thompson Grayer, MD On 10/31/2014.   Specialty:  Cardiology   Why:  11:3 am   Contact information:   [place] [address] [phone]       Duration of Discharge Encounter: Greater than 30 minutes including physician time.  Signed,  Thompson Grayer MD

## 2014-07-31 NOTE — H&P (View-Only) (Signed)
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 female who presents today for routine electrophysiology followup. ILR was implanted as part of the RIO ll clinical trial, 05/30/14, to further understand palpitations following ablation 06/07/12. Device interrogation reveals afib with 4.2% afib burden with longest episode 21 hours form which pt was symptomatic. Options were discussed today and pt prefers to pursue PVI. Has used flecainide and rhythmol in past without good results.  Today, she denies symptoms of chest pain, shortness of breath,  lower extremity edema, presyncope, or syncope.  The patient is otherwise without complaint today.   Past Medical History  Diagnosis Date  . Normal nuclear stress test 2008    low risk  . Osteoporosis   . 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 Paroxysmal atrial fibrillation     s/p ablation 10/13  . GERD (gastroesophageal reflux disease)   . Hypercholesteremia   . Kidney stone 6/11    rt. on ct 6/11  . Hypertension     [facility]   Past Surgical History  Procedure Laterality Date  . Cholecystectomy  1989  . Cesarean section  1978-1979    x-2  . Cardioversion  11/10/2011    Procedure: CARDIOVERSION;  Surgeon: Sueanne Margarita, MD;  Location: [facility];  Service: Cardiovascular;  Laterality: N/A;  . Cardioversion  05/06/2012    Procedure: CARDIOVERSION;  Surgeon: Sueanne Margarita, MD;  Location: [facility];  Service: Cardiovascular;  Laterality: N/A;  h/p in file drawer  . Tee without cardioversion  06/06/2012    Procedure: TRANSESOPHAGEAL ECHOCARDIOGRAM (TEE);  Surgeon: Jettie Booze, MD;  Location: [facility] ENDOSCOPY;  Service: Cardiovascular;  Laterality: N/A;  . Atrial fibrillation and atrial flutter ablation  06/07/12    PVI and CTI ablation by Dr Rayann Heman  . Implantable loop recorder implantation  05/30/14    Medtronic Reveal LINQ implanted by Dr Rayann Heman with RIO II protocol (ambulatory setting)     Current Outpatient Prescriptions  Medication Sig Dispense Refill  . acetaminophen (TYLENOL) 500 MG tablet Take 500 mg by mouth every 6 (six) hours as needed. For pain    . calcium carbonate (OS-CAL) 600 MG TABS tablet Take 600 mg by mouth 2 (two) times daily with a meal.     . cholecalciferol (VITAMIN D) 1000 UNITS tablet Take 1,000 Units by mouth every evening.     Marland Kitchen losartan (COZAAR) 50 MG tablet Take 1 tablet (50 mg total) by mouth daily. 90 tablet 3  . metoprolol tartrate (LOPRESSOR) 25 MG tablet TAKE 1 & 1/2 TABLETS BY MOUTH TWICE A DAY 270 tablet 2  . omeprazole (PRILOSEC) 20 MG capsule Take 20 mg by mouth daily.    . pravastatin (PRAVACHOL) 20 MG tablet Take 20 mg by mouth daily.    Marland Kitchen warfarin (COUMADIN) 5 MG tablet Take as directed by the coumadin clinic 30 tablet 3  . zoledronic acid (RECLAST) 5 MG/100ML SOLN As directed once a yr osteoporosis     No current facility-administered medications for this visit.   ROS- all systems are reviewed and negative except as per HPI above  Physical Exam: Filed Vitals:   07/04/14 1037  BP: 140/88  Pulse: 63  Height: 5\' 3"  (1.6 m)  Weight: 149 lb (67.586 kg)    GEN- The patient is well appearing, alert and oriented x 3 today.   Head- normocephalic, atraumatic Eyes-  Sclera clear, conjunctiva pink Ears- hearing intact  Oropharynx- clear Lungs- Clear to ausculation bilaterally, normal work of breathing Heart- Regular rate and rhythm, no murmurs, rubs or gallops, PMI not laterally displaced GI- soft, NT, ND, + BS Extremities- no clubbing, cyanosis, or edema  Interrogation of ILR as per HPI.  Assessment and Plan:  1. afib Recent ILR shows increasing afib burden Therapeutic strategies for afib including medicine and ablation were discussed in detail with the patient today. Risk, benefits, and alternatives to EP study and radiofrequency ablation for afib were also discussed in detail today. These risks include but are not limited to  stroke, bleeding, vascular damage, tamponade, perforation, damage to the esophagus, lungs, and other structures, pulmonary vein stenosis, worsening renal function, and death. The patient understands these risk and wishes to proceed. Continue anticoagulation with warfarin  (CHADS2VASC score is 3)   2. HTN Stable  No changes

## 2014-07-31 NOTE — Anesthesia Postprocedure Evaluation (Signed)
  Anesthesia Post-op Note  Patient: Monica [name]  Procedure(s) Performed: Procedure(s): ATRIAL FIBRILLATION ABLATION (N/A)  Patient Location: PACU  Anesthesia Type:General  Level of Consciousness: awake and alert   Airway and Oxygen Therapy: Patient Spontanous Breathing  Post-op Pain: none  Post-op Assessment: Post-op Vital signs reviewed, Patient's Cardiovascular Status Stable, Respiratory Function Stable, Patent Airway, No signs of Nausea or vomiting and Pain level controlled  Post-op Vital Signs: Reviewed and stable  Last Vitals:  Filed Vitals:   07/31/14 1457  BP: 162/71  Pulse: 85  Temp:   Resp: 18    Complications: No apparent anesthesia complications

## 2014-07-31 NOTE — Progress Notes (Signed)
Site area: RFV Site Prior to Removal:  Level 0 Pressure Applied For: 15 Manual:   YES Patient Status During Pull:  STABLE Post Pull Site:  Level 0 Post Pull Instructions Given:  YES Post Pull Pulses Present: RT DP PALPABLE Dressing Applied:  YES TEGADERM Bedrest begins @ 1230 Comments: SHEATHS REMOVED BY ALFREADOS MITCHELL RRT, RCIS

## 2014-07-31 NOTE — Anesthesia Preprocedure Evaluation (Addendum)
Anesthesia Evaluation  Patient identified by MRN, date of birth, ID band Patient awake    Reviewed: Allergy & Precautions, H&P , NPO status , Patient's Chart, lab work & pertinent test results, reviewed documented beta blocker date and time   History of Anesthesia Complications Negative for: history of anesthetic complications  Airway Mallampati: II  TM Distance: >3 FB Neck ROM: Full    Dental  (+) Teeth Intact, Dental Advisory Given   Pulmonary neg pulmonary ROS,  breath sounds clear to auscultation        Cardiovascular hypertension, Pt. on medications and Pt. on home beta blockers + dysrhythmias Atrial Fibrillation  Loop recorder placed in Oct 2015   Neuro/Psych negative neurological ROS  negative psych ROS   GI/Hepatic Neg liver ROS, GERD-  Medicated and Controlled,  Endo/Other  negative endocrine ROS  Renal/GU      Musculoskeletal negative musculoskeletal ROS (+)   Abdominal   Peds  Hematology negative hematology ROS (+)   Anesthesia Other Findings   Reproductive/Obstetrics negative OB ROS                            Anesthesia Physical Anesthesia Plan  ASA: III  Anesthesia Plan: General   Post-op Pain Management:    Induction: Intravenous  Airway Management Planned: Oral ETT  Additional Equipment:   Intra-op Plan:   Post-operative Plan: Extubation in OR  Informed Consent: I have reviewed the patients History and Physical, chart, labs and discussed the procedure including the risks, benefits and alternatives for the proposed anesthesia with the patient or authorized representative who has indicated his/her understanding and acceptance.   Dental advisory given  Plan Discussed with: CRNA, Anesthesiologist and Surgeon  Anesthesia Plan Comments:         Anesthesia Quick Evaluation

## 2014-07-31 NOTE — Interval H&P Note (Signed)
History and Physical Interval Note:  07/31/2014 7:07 AM  Monica [name]  has presented today for surgery, with the diagnosis of afib  The various methods of treatment have been discussed with the patient and family. After consideration of risks, benefits and other options for treatment, the patient has consented to  Procedure(s): ATRIAL FIBRILLATION ABLATION (N/A) as a surgical intervention .  The patient's history has been reviewed, patient examined, no change in status, stable for surgery.  I have reviewed the patient's chart and labs.  Questions were answered to the patient's satisfaction.     Thompson Grayer

## 2014-07-31 NOTE — Anesthesia Procedure Notes (Signed)
Procedure Name: Intubation Date/Time: 07/31/2014 8:55 AM Performed by: Jenne Campus Pre-anesthesia Checklist: Patient identified, Emergency Drugs available, Suction available, Patient being monitored and Timeout performed Patient Re-evaluated:Patient Re-evaluated prior to inductionOxygen Delivery Method: Circle system utilized Preoxygenation: Pre-oxygenation with 100% oxygen Intubation Type: IV induction Ventilation: Mask ventilation without difficulty Laryngoscope Size: Miller and 2 Grade View: Grade I Tube type: Oral Tube size: 7.0 mm Number of attempts: 1 Airway Equipment and Method: Stylet Placement Confirmation: positive ETCO2,  ETT inserted through vocal cords under direct vision,  CO2 detector and breath sounds checked- equal and bilateral Secured at: 21 cm Tube secured with: Tape Dental Injury: Teeth and Oropharynx as per pre-operative assessment

## 2014-07-31 NOTE — Progress Notes (Signed)
Patient ambulated to restroom after bedrest.  Right groin level 0, pedal pulse +2 bilaterally. [place], Ardeth Sportsman

## 2014-07-31 NOTE — Progress Notes (Signed)
Utilization Review Completed.Donne Anon T12/15/2015

## 2014-07-31 NOTE — Transfer of Care (Signed)
Immediate Anesthesia Transfer of Care Note  Patient: Monica [name]  Procedure(s) Performed: Procedure(s): ATRIAL FIBRILLATION ABLATION (N/A)  Patient Location: PACU  Anesthesia Type:General  Level of Consciousness: awake, alert  and oriented  Airway & Oxygen Therapy: Patient Spontanous Breathing and Patient connected to nasal cannula oxygen  Post-op Assessment: Report given to PACU RN and Post -op Vital signs reviewed and stable  Post vital signs: Reviewed and stable  Complications: No apparent anesthesia complications

## 2014-07-31 NOTE — Op Note (Signed)
SURGEON: Thompson Grayer, MD  PREPROCEDURE DIAGNOSES: 1. Paroxysmal atrial fibrillation. 2. Atrial flutter  POSTPROCEDURE DIAGNOSES: 1. Paroxysmal atrial fibrillation. 2. Left atrial flutter  PROCEDURES: 1. Comprehensive electrophysiologic study. 2. Coronary sinus pacing and recording. 3. Three-dimensional mapping of atrial fibrillation with additional mapping and ablation within the left atrium  4. Ablation of atrial fibrillation with additional mapping and ablation within the left atrium  5. Intracardiac echocardiography. 6. Transseptal puncture of an intact septum. 7. Rotational Angiography with processing at an independent workstation 8. Dobutamine infusion with arrhythmia induction with pacing  INTRODUCTION: Monica [name] is a 73 y.o. female with a history of paroxysmal atrial fibrillation who now presents for EP study and radiofrequency ablation.  The patient has symptomatic paroxysmal atrial fibrillation. She has failed medical therapy with metoprolol and rhythmol.  She underwent ablation of atrial fibrillation and isthmus dependant right atrial flutter in 2013.  She did well initially following ablation.  She has recently developed recurrent symptomatic afib. The patient therefore presents today for repeat catheter ablation of atrial fibrillation and atrial flutter.  DESCRIPTION OF PROCEDURE: Informed written consent was obtained, and the patient was brought to the electrophysiology lab in a fasting state. The patient was adequately sedated with intravenous medications as outlined in the anesthesia report. The patient's left and right groins were prepped and draped in the usual sterile fashion by the EP lab staff. Using a percutaneous Seldinger technique, two 7-French and one 8-French hemostasis sheaths were placed into the right common femoral vein.   An 11-French hemostasis sheath was placed into the left common femoral vein.   3 Dimensional Rotational Angiography: A 5  french pigtail catheter was introduced through the right common femoral vein and advanced into the inferior venocava. 3 demential rotational angiography was then performed by power injection of 100cc of nonionic contrast. Reprocessing at an independent work station was then performed. This demonstrated a large sized left atrium with 4 separate pulmonary veins which were moderate in size. There were no anomalous veins or significant abnormalities.There was no evidence of pulmonary vein stenosis.  A 3 dimensional rendering of the left atrium was then merged using [employer] onto the [employer] system and registered with intracardiac echo (see below). The pigtail catheter was then removed.  Catheter Placement: A 7-French Biosense Webster Decapolar coronary sinus catheter was introduced through the right common femoral vein and advanced into the coronary sinus for recording and pacing from this location. A 6-French quadripolar Josephson catheter was introduced through the right common femoral vein and advanced into the right ventricle for recording and pacing. This catheter was then pulled back to the His bundle location.   Initial Measurements: The patient presented to the electrophysiology lab in sinus rhythm. Her PR interval measured 174 with a QRS duringation of 94 and a QT interval of 359msec. The AH interval measured 91 and the HV interval measured 39 msec.   Intracardiac Echocardiography: A 10-French Biosense Webster AcuNav intracardiac echocardiography catheter was introduced through the left common femoral vein and advanced into the right atrium. Intracardiac echocardiography was performed of the left atrium, and a three-dimensional anatomical rendering of the left atrium was performed using CARTO sound technology. The patient was noted to have a moderate sized left atrium. The interatrial septum was prominent but not aneurysmal. All 4 pulmonary veins were visualized  and noted to have separate ostia. The pulmonary veins were moderate in size. The left atrial appendage was visualized and did not reveal thrombus. There was no  evidence of pulmonary vein stenosis.   Transseptal Puncture: The middle right common femoral vein sheath was exchanged for an 8.5 [place] SL2 transseptal sheath and transseptal access was achieved in a standard fashion using a Brockenbrough needle under biplane fluoroscopy with intracardiac echocardiography confirmation of the transseptal puncture. Once transseptal access had been achieved, heparin was administered intravenously and intra- arterially in order to maintain an ACT of greater than 350 seconds throughout the procedure.    3D Mapping and Ablation: The His bundle catheter was removed and in its place a 3.5 mm Biosense [place] ablation catheter was advanced into the right atrium. The transseptal sheath was pulled back into the IVC over a guidewire. The ablation catheter was advanced across the transseptal hole using the wire as a guide. The transseptal sheath was then re-advanced over the guidewire into the left atrium. A duodecapolar Biosense Webster circular mapping catheter was introduced through the transseptal sheath and positioned over the mouth of all 4 pulmonary veins. Three-dimensional electroanatomical mapping was performed using CARTO technology. This demonstrated that the left superior and inferior pulmonary veins were quiescent from the prior ablation procedure.  Ablation was delivered at the carina between these two veins.  There was minimal conduction along there carinal side of the right superior pulmonary vein and this vein was re-isolated using RF.  With catheter manipulation near the right inferior pulmonary vein, the patient developed atrial fibrillation.  There was return of conduction within the right inferior pulmonary vein.  This vein was successfully re-isolated and anatomically encircled  using radiofrequency current with a circular mapping catheter as a guide.  Complex fractionated electrograms were identified and ablated along there posterior wall of the left atrium.  During ablation of CAFE's in this area, atrial fibrillation terminated.  The ablation catheter was then pulled back into the right atrial and positioned along the cavo-tricuspid isthmus.  Complete bidirectional cavotricuspid isthmus block was demonstrated from a prior ablation procedure as confirmed by differential atrial pacing from the low lateral right atrium. A stimulus to earliest atrial activation across the isthmus measured 200 msec bi-directionally.   Measurements Following Ablation: In sinus rhythm following her ablation, his RR interval was 743, with PR 149 msec, QRS 83 msec, and Qt 414 msec. Following ablation the AH interval measured 78 msec with an HV interval of 37 msec. Ventricular pacing was performed, which revealed midline concentric decremental VA conduction with a VA WCL of 490 msec.  Dobutamine was then infused at 20 mcg/kg/min.  Rapid atrial pacing was performed, which revealed an AV Wenckebach cycle length of 360 msec. Atypical atrial flutter was induced with RAP at 250 msec.  This atrial flutter had the earliest activation along the CS 78 pair.  The cycle length was 260 msec.  3 D mapping was performed within the left atrium.  160 msec of the tachycardia CL was mapped within the left atrium at then the tachycardia spontaneously terminated.  Unfortunately, the tachycardia could not be reinduced despite multiple attempts on dobutamine at 20 mcg/kg/min. Adenosine 12mg  was infused prior to evaluation of each pulmonary vein.  LV pacing was performed during transient AV block.  There was trivial return of conduction within the RSPV which was reisolated.  All other veins remains quiescent.  Entrance and exit block was demonstrated for all four PVs.  The procedure was therefore considered completed. All  catheters were removed, and the sheaths were aspirated and flushed. The patient was transferred to the recovery area for sheath removal  per protocol. A limited bedside transthoracic echocardiogram revealed no pericardial effusion. There were no early apparent complications.  CONCLUSIONS: 1. Sinus rhythm upon presentation.  2. Rotational Angiography reveals a large sized left atrium with four separate pulmonary veins without evidence of pulmonary vein stenosis. 3. The LSPV and LIPV were confirmed to be isolated from a prior ablation.  There was trivial conduction within the RSPV and then was re-isolated today.  The culpril PV appears to have been the RIPV.  This vein was successfully reisolated today. 4. Cavo-tricuspid isthmus ablation was confirmed from a prior ablation 5. CAFEs ablated along the posterior wall of the LA 6. Left atrium flutter (CL 260 msec) was induced with doputamine.  Unfortunately the tachycardia terminated during 3D mapping and could not be reinduced for ablation today 7. No early apparent complications.

## 2014-08-01 DIAGNOSIS — I480 Paroxysmal atrial fibrillation: Secondary | ICD-10-CM

## 2014-08-01 DIAGNOSIS — I10 Essential (primary) hypertension: Secondary | ICD-10-CM | POA: Diagnosis not present

## 2014-08-01 DIAGNOSIS — K219 Gastro-esophageal reflux disease without esophagitis: Secondary | ICD-10-CM | POA: Diagnosis not present

## 2014-08-01 DIAGNOSIS — M810 Age-related osteoporosis without current pathological fracture: Secondary | ICD-10-CM | POA: Diagnosis not present

## 2014-08-01 LAB — BASIC METABOLIC PANEL
ANION GAP: 11 (ref 5–15)
BUN: 14 mg/dL (ref 6–23)
CALCIUM: 8.7 mg/dL (ref 8.4–10.5)
CO2: 24 meq/L (ref 19–32)
CREATININE: 0.74 mg/dL (ref 0.50–1.10)
Chloride: 104 mEq/L (ref 96–112)
GFR calc non Af Amer: 82 mL/min — ABNORMAL LOW (ref >90)
Glucose, Bld: 111 mg/dL — ABNORMAL HIGH (ref 70–99)
Potassium: 4.1 mEq/L (ref 3.7–5.3)
SODIUM: 139 meq/L (ref 137–147)

## 2014-08-01 LAB — PROTIME-INR
INR: 2.57 — AB (ref 0.00–1.49)
PROTHROMBIN TIME: 27.8 s — AB (ref 11.6–15.2)

## 2014-08-01 LAB — POCT ACTIVATED CLOTTING TIME: Activated Clotting Time: 165 seconds

## 2014-08-01 NOTE — Progress Notes (Signed)
Loop recorder 

## 2014-08-01 NOTE — Discharge Instructions (Signed)
No driving for 5 days. No lifting over 5 lbs for 1 week. No sexual activity for 1 week. You may return to work in 7 days. Keep procedure site clean & dry. If you notice increased pain, swelling, bleeding or pus, call/return!  You may shower, but no soaking baths/hot tubs/pools for 1 week.  ° ° °

## 2014-08-02 ENCOUNTER — Encounter (HOSPITAL_COMMUNITY): Payer: Self-pay | Admitting: *Deleted

## 2014-08-08 ENCOUNTER — Ambulatory Visit (INDEPENDENT_AMBULATORY_CARE_PROVIDER_SITE_OTHER): Payer: Medicare Other | Admitting: *Deleted

## 2014-08-08 DIAGNOSIS — I4891 Unspecified atrial fibrillation: Secondary | ICD-10-CM

## 2014-08-08 LAB — POCT INR: INR: 2.3

## 2014-08-14 ENCOUNTER — Encounter: Payer: Self-pay | Admitting: Internal Medicine

## 2014-08-19 ENCOUNTER — Encounter (HOSPITAL_COMMUNITY): Payer: Self-pay | Admitting: *Deleted

## 2014-08-20 ENCOUNTER — Encounter: Payer: Self-pay | Admitting: Internal Medicine

## 2014-08-22 LAB — MDC_IDC_ENUM_SESS_TYPE_REMOTE: Date Time Interrogation Session: 20151213050500

## 2014-08-27 ENCOUNTER — Encounter: Payer: Self-pay | Admitting: Internal Medicine

## 2014-08-28 ENCOUNTER — Encounter: Payer: Self-pay | Admitting: Internal Medicine

## 2014-08-28 ENCOUNTER — Ambulatory Visit (INDEPENDENT_AMBULATORY_CARE_PROVIDER_SITE_OTHER): Payer: Medicare Other | Admitting: *Deleted

## 2014-08-28 DIAGNOSIS — I480 Paroxysmal atrial fibrillation: Secondary | ICD-10-CM

## 2014-08-30 ENCOUNTER — Encounter (HOSPITAL_COMMUNITY): Payer: Self-pay | Admitting: Cardiology

## 2014-08-31 NOTE — Progress Notes (Signed)
Loop recorder 

## 2014-09-05 ENCOUNTER — Ambulatory Visit (INDEPENDENT_AMBULATORY_CARE_PROVIDER_SITE_OTHER): Payer: Medicare Other | Admitting: *Deleted

## 2014-09-05 DIAGNOSIS — I4891 Unspecified atrial fibrillation: Secondary | ICD-10-CM

## 2014-09-05 LAB — POCT INR: INR: 2.3

## 2014-09-11 ENCOUNTER — Encounter: Payer: Self-pay | Admitting: Internal Medicine

## 2014-09-13 ENCOUNTER — Encounter: Payer: Self-pay | Admitting: Internal Medicine

## 2014-09-14 ENCOUNTER — Encounter: Payer: Self-pay | Admitting: Internal Medicine

## 2014-09-20 ENCOUNTER — Encounter: Payer: Self-pay | Admitting: Internal Medicine

## 2014-09-23 LAB — MDC_IDC_ENUM_SESS_TYPE_REMOTE: Date Time Interrogation Session: 20160113050500

## 2014-09-27 ENCOUNTER — Encounter: Payer: Self-pay | Admitting: Internal Medicine

## 2014-09-27 ENCOUNTER — Ambulatory Visit (INDEPENDENT_AMBULATORY_CARE_PROVIDER_SITE_OTHER): Payer: Medicare Other | Admitting: *Deleted

## 2014-09-27 DIAGNOSIS — I480 Paroxysmal atrial fibrillation: Secondary | ICD-10-CM

## 2014-10-01 NOTE — Progress Notes (Signed)
Loop recorder 

## 2014-10-08 LAB — MDC_IDC_ENUM_SESS_TYPE_REMOTE: Date Time Interrogation Session: 20160211050500

## 2014-10-09 ENCOUNTER — Encounter: Payer: Self-pay | Admitting: Internal Medicine

## 2014-10-11 ENCOUNTER — Encounter: Payer: Self-pay | Admitting: Internal Medicine

## 2014-10-17 ENCOUNTER — Ambulatory Visit (INDEPENDENT_AMBULATORY_CARE_PROVIDER_SITE_OTHER): Payer: Medicare Other | Admitting: *Deleted

## 2014-10-17 DIAGNOSIS — I4891 Unspecified atrial fibrillation: Secondary | ICD-10-CM

## 2014-10-17 LAB — POCT INR: INR: 2.4

## 2014-10-19 ENCOUNTER — Encounter: Payer: Self-pay | Admitting: Internal Medicine

## 2014-10-26 ENCOUNTER — Ambulatory Visit (INDEPENDENT_AMBULATORY_CARE_PROVIDER_SITE_OTHER): Payer: Medicare Other | Admitting: *Deleted

## 2014-10-26 DIAGNOSIS — I480 Paroxysmal atrial fibrillation: Secondary | ICD-10-CM

## 2014-10-27 ENCOUNTER — Encounter: Payer: Self-pay | Admitting: Internal Medicine

## 2014-10-29 ENCOUNTER — Encounter: Payer: Self-pay | Admitting: Internal Medicine

## 2014-10-31 ENCOUNTER — Encounter: Payer: Self-pay | Admitting: Internal Medicine

## 2014-10-31 ENCOUNTER — Ambulatory Visit (INDEPENDENT_AMBULATORY_CARE_PROVIDER_SITE_OTHER): Payer: Medicare Other | Admitting: Internal Medicine

## 2014-10-31 VITALS — BP 154/86 | HR 66 | Ht 63.0 in | Wt 149.8 lb

## 2014-10-31 DIAGNOSIS — I10 Essential (primary) hypertension: Secondary | ICD-10-CM

## 2014-10-31 DIAGNOSIS — I480 Paroxysmal atrial fibrillation: Secondary | ICD-10-CM

## 2014-10-31 LAB — MDC_IDC_ENUM_SESS_TYPE_INCLINIC
Date Time Interrogation Session: 20160316120743
MDC IDC SET ZONE DETECTION INTERVAL: 2000 ms
Zone Setting Detection Interval: 3000 ms
Zone Setting Detection Interval: 380 ms

## 2014-10-31 MED ORDER — LOSARTAN POTASSIUM 50 MG PO TABS: 75.0000 mg | ORAL_TABLET | Freq: Every day | ORAL | Status: DC

## 2014-10-31 NOTE — Progress Notes (Signed)
Electrophysiology Office Note   Date:  10/31/2014   ID:  Monica [name], DOB Jul 04, 1941, MRN [id number]  PCP:  Reginia Naas, MD  Cardiologist:  Dr Radford Pax Primary Electrophysiologist: Thompson Grayer, MD    Chief Complaint  Patient presents with  . Follow-up    PAF     History of Present Illness: Monica [name] is a 74 y.o. female who presents today for electrophysiology evaluation.   She is doing well s/p ablation.  She denies procedure related complications.  She has had only 2 episodes of ERAF. She has occasional indigestion but denies exertional chest pain.  She describes this as mild.  She denies odynophagia.  Today, she denies symptoms of palpitations  shortness of breath, orthopnea, PND, lower extremity edema, claudication, dizziness, presyncope, syncope, bleeding, or neurologic sequela. The patient is tolerating medications without difficulties and is otherwise without complaint today.    Past Medical History  Diagnosis Date  . Normal nuclear stress test 2008    low risk  . Osteoporosis   . 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 Paroxysmal atrial fibrillation     a. PVI 10/13 b. PVI 12/15  . GERD (gastroesophageal reflux disease)   . Hypercholesteremia   . Kidney stone 6/11    rt. on ct 6/11  . Hypertension     [facility]   Past Surgical History  Procedure Laterality Date  . Cholecystectomy  1989  . Cesarean section  1978-1979    x-2  . Cardioversion  05/06/2012    Procedure: CARDIOVERSION;  Surgeon: Sueanne Margarita, MD;  Location: [facility] ENDOSCOPY;  Service: Cardiovascular;  Laterality: N/A;  h/p in file drawer  . Tee without cardioversion  06/06/2012    Procedure: TRANSESOPHAGEAL ECHOCARDIOGRAM (TEE);  Surgeon: Jettie Booze, MD;  Location: [facility];  Service: Cardiovascular;  Laterality: N/A;  . Implantable loop recorder implantation  05/30/14    Medtronic Reveal LINQ implanted by Dr Rayann Heman with RIO II protocol (ambulatory setting)  . Atrial  fibrillation ablation N/A 06/07/2012    PVI and CTI ablation by Dr Rayann Heman  . Tee without cardioversion N/A 07/30/2014    Procedure: TRANSESOPHAGEAL ECHOCARDIOGRAM (TEE);  Surgeon: Larey Dresser, MD;  Location: [facility] ENDOSCOPY;  Service: Cardiovascular;  Laterality: N/A;  . Atrial fibrillation ablation N/A 07/31/2014    PVI by Dr Rayann Heman; initial ablation 2013     Current Outpatient Prescriptions  Medication Sig Dispense Refill  . acetaminophen (TYLENOL) 500 MG tablet Take 500 mg by mouth every 6 (six) hours as needed. For pain    . calcium carbonate (OS-CAL) 600 MG TABS tablet Take 600 mg by mouth 2 (two) times daily with a meal.     . cholecalciferol (VITAMIN D) 1000 UNITS tablet Take 1,000 Units by mouth every evening.     Marland Kitchen losartan (COZAAR) 50 MG tablet Take 1.5 tablets (75 mg total) by mouth daily. 135 tablet 3  . metoprolol tartrate (LOPRESSOR) 25 MG tablet TAKE 1 & 1/2 TABLETS BY MOUTH TWICE A DAY 270 tablet 2  . omeprazole (PRILOSEC) 40 MG capsule Take 40 mg by mouth daily.  1  . pravastatin (PRAVACHOL) 20 MG tablet Take 20 mg by mouth daily.  1  . traZODone (DESYREL) 50 MG tablet Take 25-50 mg by mouth at bedtime as needed for sleep.     Marland Kitchen warfarin (COUMADIN) 5 MG tablet Take as directed by the coumadin clinic (Patient taking differently: Take 2.5-5 mg by mouth daily.  Take as directed by the coumadin clinic Take 2.5mg  every day except Friday take 5mg ) 30 tablet 3   No current facility-administered medications for this visit.    Allergies:   Codeine   Social History:  The patient  reports that she has never smoked. She does not have any smokeless tobacco history on file. She reports that she does not drink alcohol or use illicit drugs.   Family History:  The patient's  family history includes Atrial fibrillation in her sister; CAD in her father; Heart attack in her brother and father; Hip fracture in her mother; Lung cancer in her brother; Other in her brother; Ovarian cancer in  her sister; Thyroid cancer in her sister.    ROS:  Please see the history of present illness.   All other systems are reviewed and negative.    PHYSICAL EXAM: VS:  BP 154/86 mmHg  Pulse 66  Ht 5\' 3"  (1.6 m)  Wt 149 lb 12.8 oz (67.949 kg)  BMI 26.54 kg/m2 , BMI Body mass index is 26.54 kg/(m^2). GEN: Well nourished, well developed, in no acute distress HEENT: normal Neck: no JVD, carotid bruits, or masses Cardiac: RRR; no murmurs, rubs, or gallops,no edema  Respiratory:  clear to auscultation bilaterally, normal work of breathing GI: soft, nontender, nondistended, + BS MS: no deformity or atrophy Skin: warm and dry  Neuro:  Strength and sensation are intact Psych: euthymic mood, full affect  EKG:  EKG is ordered today. The ekg ordered today shows sinus rhythm  ILR interrogation is reviewed (see paceart)   Recent Labs: 07/24/2014: Hemoglobin 11.8*; Platelets 265.0 08/01/2014: BUN 14; Creatinine 0.74; Potassium 4.1; Sodium 139    Lipid Panel  No results found for: CHOL, TRIG, HDL, CHOLHDL, VLDL, LDLCALC, LDLDIRECT   Wt Readings from Last 3 Encounters:  10/31/14 149 lb 12.8 oz (67.949 kg)  07/31/14 147 lb 7.8 oz (66.9 kg)  07/30/14 145 lb (65.772 kg)      ASSESSMENT AND PLAN:  1.  afib Doing well s/p recent ablation (initial ablation 2013, more recently ablated 12/15) She is maintaining sinus rhythm off of AAD therapy chads2vasc score is at least 3  2. HTN Stable No change required today   Current medicines are reviewed at length with the patient today.   The patient does not have concerns regarding her medicines.  The following changes were made today:  none  Labs/ tests ordered today include:  Orders Placed This Encounter  Procedures  . Implantable device check  . EKG 12-Lead    Follow-up follow-up with Roderic Palau NP in 3 months, I will see in 6 months Follow-up with Dr Radford Pax as scheduled  Signed, Thompson Grayer, MD  10/31/2014 2:11 PM     [place] [address] [place] [address] [phone] (office) [phone] (fax)

## 2014-10-31 NOTE — Patient Instructions (Signed)
Your physician recommends that you schedule a follow-up appointment in: 3 months with Roderic Palau, NP  Your physician wants you to follow-up in: 6 months with Dr Vallery Ridge will receive a reminder letter in the mail two months in advance. If you don't receive a letter, please call our office to schedule the follow-up appointment.  Your physician has recommended you make the following change in your medication:  1) Increase Losartan to 75 mg daily

## 2014-11-01 NOTE — Progress Notes (Signed)
Loop recorder 

## 2014-11-02 ENCOUNTER — Encounter: Payer: Self-pay | Admitting: Internal Medicine

## 2014-11-05 ENCOUNTER — Encounter: Payer: Self-pay | Admitting: Internal Medicine

## 2014-11-13 LAB — MDC_IDC_ENUM_SESS_TYPE_REMOTE: Date Time Interrogation Session: 20160312050500

## 2014-11-14 ENCOUNTER — Encounter: Payer: Self-pay | Admitting: Internal Medicine

## 2014-11-21 ENCOUNTER — Encounter: Payer: Self-pay | Admitting: Internal Medicine

## 2014-11-23 ENCOUNTER — Encounter: Payer: Self-pay | Admitting: Internal Medicine

## 2014-11-26 ENCOUNTER — Encounter: Payer: Self-pay | Admitting: Internal Medicine

## 2014-11-26 ENCOUNTER — Ambulatory Visit (INDEPENDENT_AMBULATORY_CARE_PROVIDER_SITE_OTHER): Payer: Medicare Other | Admitting: *Deleted

## 2014-11-26 DIAGNOSIS — I480 Paroxysmal atrial fibrillation: Secondary | ICD-10-CM

## 2014-11-28 ENCOUNTER — Ambulatory Visit (INDEPENDENT_AMBULATORY_CARE_PROVIDER_SITE_OTHER): Payer: Medicare Other | Admitting: *Deleted

## 2014-11-28 DIAGNOSIS — I481 Persistent atrial fibrillation: Secondary | ICD-10-CM

## 2014-11-28 DIAGNOSIS — Z5181 Encounter for therapeutic drug level monitoring: Secondary | ICD-10-CM

## 2014-11-28 DIAGNOSIS — I4891 Unspecified atrial fibrillation: Secondary | ICD-10-CM | POA: Diagnosis not present

## 2014-11-28 DIAGNOSIS — I4819 Other persistent atrial fibrillation: Secondary | ICD-10-CM

## 2014-11-28 LAB — POCT INR: INR: 2.5

## 2014-11-29 NOTE — Progress Notes (Signed)
Loop recorder 

## 2014-12-04 ENCOUNTER — Encounter: Payer: Self-pay | Admitting: Internal Medicine

## 2014-12-05 ENCOUNTER — Encounter: Payer: Self-pay | Admitting: Internal Medicine

## 2014-12-06 ENCOUNTER — Encounter: Payer: Self-pay | Admitting: Internal Medicine

## 2014-12-07 ENCOUNTER — Encounter: Payer: Self-pay | Admitting: Internal Medicine

## 2014-12-16 ENCOUNTER — Other Ambulatory Visit: Payer: Self-pay | Admitting: Cardiology

## 2014-12-19 ENCOUNTER — Encounter: Payer: Self-pay | Admitting: Internal Medicine

## 2014-12-21 ENCOUNTER — Encounter: Payer: Self-pay | Admitting: Internal Medicine

## 2014-12-21 LAB — CUP PACEART REMOTE DEVICE CHECK: Date Time Interrogation Session: 20160411040500

## 2014-12-25 ENCOUNTER — Encounter: Payer: Self-pay | Admitting: Internal Medicine

## 2014-12-26 ENCOUNTER — Encounter: Payer: Self-pay | Admitting: Internal Medicine

## 2014-12-26 ENCOUNTER — Ambulatory Visit (INDEPENDENT_AMBULATORY_CARE_PROVIDER_SITE_OTHER): Payer: Medicare Other | Admitting: *Deleted

## 2014-12-26 DIAGNOSIS — I480 Paroxysmal atrial fibrillation: Secondary | ICD-10-CM

## 2014-12-28 ENCOUNTER — Encounter: Payer: Self-pay | Admitting: Internal Medicine

## 2014-12-28 NOTE — Progress Notes (Signed)
Loop recorder 

## 2014-12-30 ENCOUNTER — Other Ambulatory Visit: Payer: Self-pay | Admitting: Internal Medicine

## 2014-12-31 ENCOUNTER — Other Ambulatory Visit: Payer: Self-pay

## 2014-12-31 MED ORDER — METOPROLOL TARTRATE 25 MG PO TABS: ORAL_TABLET | ORAL | Status: DC

## 2015-01-01 ENCOUNTER — Encounter: Payer: Self-pay | Admitting: Internal Medicine

## 2015-01-09 ENCOUNTER — Ambulatory Visit (INDEPENDENT_AMBULATORY_CARE_PROVIDER_SITE_OTHER): Payer: Medicare Other | Admitting: *Deleted

## 2015-01-09 DIAGNOSIS — I481 Persistent atrial fibrillation: Secondary | ICD-10-CM | POA: Diagnosis not present

## 2015-01-09 DIAGNOSIS — I4819 Other persistent atrial fibrillation: Secondary | ICD-10-CM

## 2015-01-09 DIAGNOSIS — I4891 Unspecified atrial fibrillation: Secondary | ICD-10-CM

## 2015-01-09 DIAGNOSIS — Z5181 Encounter for therapeutic drug level monitoring: Secondary | ICD-10-CM

## 2015-01-09 LAB — POCT INR: INR: 2

## 2015-01-15 LAB — CUP PACEART REMOTE DEVICE CHECK: MDC IDC SESS DTM: 20160519040500

## 2015-01-17 ENCOUNTER — Encounter: Payer: Self-pay | Admitting: Internal Medicine

## 2015-01-18 ENCOUNTER — Encounter: Payer: Self-pay | Admitting: Internal Medicine

## 2015-01-25 ENCOUNTER — Encounter: Payer: Self-pay | Admitting: Internal Medicine

## 2015-01-25 ENCOUNTER — Ambulatory Visit (INDEPENDENT_AMBULATORY_CARE_PROVIDER_SITE_OTHER): Payer: Medicare Other | Admitting: *Deleted

## 2015-01-25 DIAGNOSIS — I480 Paroxysmal atrial fibrillation: Secondary | ICD-10-CM

## 2015-01-25 NOTE — Progress Notes (Signed)
Loop recorder 

## 2015-01-28 ENCOUNTER — Encounter (HOSPITAL_COMMUNITY): Payer: Self-pay | Admitting: Nurse Practitioner

## 2015-01-28 ENCOUNTER — Ambulatory Visit (HOSPITAL_COMMUNITY)
Admission: RE | Admit: 2015-01-28 | Discharge: 2015-01-28 | Disposition: A | Discharge status: Home / Self Care | Payer: Medicare Other | Source: Ambulatory Visit | Attending: Nurse Practitioner | Admitting: Nurse Practitioner

## 2015-01-28 VITALS — BP 140/84 | HR 60 | Ht 63.0 in | Wt 151.2 lb

## 2015-01-28 DIAGNOSIS — I4891 Unspecified atrial fibrillation: Secondary | ICD-10-CM | POA: Diagnosis not present

## 2015-01-28 DIAGNOSIS — E780 Pure hypercholesterolemia: Secondary | ICD-10-CM | POA: Diagnosis not present

## 2015-01-28 DIAGNOSIS — Z7901 Long term (current) use of anticoagulants: Secondary | ICD-10-CM | POA: Diagnosis not present

## 2015-01-28 DIAGNOSIS — K219 Gastro-esophageal reflux disease without esophagitis: Secondary | ICD-10-CM | POA: Diagnosis not present

## 2015-01-28 DIAGNOSIS — I481 Persistent atrial fibrillation: Secondary | ICD-10-CM

## 2015-01-28 DIAGNOSIS — I4819 Other persistent atrial fibrillation: Secondary | ICD-10-CM

## 2015-01-28 DIAGNOSIS — Z8249 Family history of ischemic heart disease and other diseases of the circulatory system: Secondary | ICD-10-CM | POA: Diagnosis not present

## 2015-01-28 DIAGNOSIS — Z79899 Other long term (current) drug therapy: Secondary | ICD-10-CM | POA: Diagnosis not present

## 2015-01-28 DIAGNOSIS — I10 Essential (primary) hypertension: Secondary | ICD-10-CM | POA: Insufficient documentation

## 2015-01-28 LAB — CUP PACEART REMOTE DEVICE CHECK: Date Time Interrogation Session: 20160529040500

## 2015-01-28 NOTE — Progress Notes (Signed)
Patient ID: CANYON WILLOW, female   DOB: 19-Sep-1940, 74 y.o.   MRN: [id number]        Date:  01/28/2015   ID:  SEANNA SISLER, DOB 1940-11-25, MRN [id number]  PCP:  Reginia Naas, MD  Cardiologist:  Dr Radford Pax Primary Electrophysiologist: Dr. Thompson Grayer  Chief Complaint  Patient presents with  . Atrial Fibrillation     History of Present Illness: AYDEN APODACA is a 74 y.o. female who presents today for f/u in the afib clinic.   She is doing well s/p ablation.  She    has had 3- 4 episodes of afib relieved with metoprolol after taking in 2-3 hours. She also has an occasional lower blood pressure (sys around 90-100) and we discussed how to monitor and adjust losartan accordingly.   Today, she denies symptoms of palpitations  positive for mild shortness of breath, no orthopnea, PND, lower extremity edema, claudication, dizziness, presyncope, syncope, bleeding, or neurologic sequela. The patient is tolerating medications without difficulties and is otherwise without complaint today.    Past Medical History  Diagnosis Date  . Normal nuclear stress test 2008    low risk  . Osteoporosis   . 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 Paroxysmal atrial fibrillation     a. PVI 10/13 b. PVI 12/15  . GERD (gastroesophageal reflux disease)   . Hypercholesteremia   . Kidney stone 6/11    rt. on ct 6/11  . Hypertension     [facility]   Past Surgical History  Procedure Laterality Date  . Cholecystectomy  1989  . Cesarean section  1978-1979    x-2  . Cardioversion  05/06/2012    Procedure: CARDIOVERSION;  Surgeon: Sueanne Margarita, MD;  Location: [facility] ENDOSCOPY;  Service: Cardiovascular;  Laterality: N/A;  h/p in file drawer  . Tee without cardioversion  06/06/2012    Procedure: TRANSESOPHAGEAL ECHOCARDIOGRAM (TEE);  Surgeon: Jettie Booze, MD;  Location: [place];  Service: Cardiovascular;  Laterality: N/A;  . Implantable loop recorder implantation  05/30/14    Medtronic  Reveal LINQ implanted by Dr Rayann Heman with RIO II protocol (ambulatory setting)  . Atrial fibrillation ablation N/A 06/07/2012    PVI and CTI ablation by Dr Rayann Heman  . Tee without cardioversion N/A 07/30/2014    Procedure: TRANSESOPHAGEAL ECHOCARDIOGRAM (TEE);  Surgeon: Larey Dresser, MD;  Location: [facility] ENDOSCOPY;  Service: Cardiovascular;  Laterality: N/A;  . Atrial fibrillation ablation N/A 07/31/2014    PVI by Dr Rayann Heman; initial ablation 2013     Current Outpatient Prescriptions  Medication Sig Dispense Refill  . acetaminophen (TYLENOL) 500 MG tablet Take 500 mg by mouth every 6 (six) hours as needed. For pain    . calcium carbonate (OS-CAL) 600 MG TABS tablet Take 600 mg by mouth 2 (two) times daily with a meal.     . cholecalciferol (VITAMIN D) 1000 UNITS tablet Take 1,000 Units by mouth every evening.     Marland Kitchen losartan (COZAAR) 50 MG tablet Take 1.5 tablets (75 mg total) by mouth daily. 135 tablet 3  . metoprolol tartrate (LOPRESSOR) 25 MG tablet TAKE 1 & 1/2 TABLETS BY MOUTH TWICE A DAY 270 tablet 2  . omeprazole (PRILOSEC) 40 MG capsule Take 40 mg by mouth daily.  1  . pravastatin (PRAVACHOL) 20 MG tablet Take 20 mg by mouth daily.  1  . traZODone (DESYREL) 50 MG tablet Take 25-50 mg by mouth at bedtime as needed for  sleep.     . warfarin (COUMADIN) 5 MG tablet TAKE AS DIRECTED BY THE COUMADIN CLINIC 30 tablet 3   No current facility-administered medications for this encounter.    Allergies:   Codeine   Social History:  The patient  reports that she has never smoked. She does not have any smokeless tobacco history on file. She reports that she does not drink alcohol or use illicit drugs.   Family History:  The patient's  family history includes Atrial fibrillation in her sister; CAD in her father; Heart attack in her brother and father; Hip fracture in her mother; Lung cancer in her brother; Other in her brother; Ovarian cancer in her sister; Thyroid cancer in her sister.    ROS:   Please see the history of present illness.   All other systems are reviewed and negative.    PHYSICAL EXAM: VS:  BP 140/84 mmHg  Pulse 60  Ht 5\' 3"  (1.6 m)  Wt 151 lb 3.2 oz (68.584 kg)  BMI 26.79 kg/m2 , BMI Body mass index is 26.79 kg/(m^2). Recheck BP 140/84 GEN: Well nourished, well developed, in no acute distress HEENT: normal Neck: no JVD, carotid bruits, or masses Cardiac: RRR; no murmurs, rubs, or gallops,no edema  Respiratory:  clear to auscultation bilaterally, normal work of breathing GI: soft, nontender, nondistended, + BS MS: no deformity or atrophy Skin: warm and dry  Neuro:  Strength and sensation are intact Psych: euthymic mood, full affect  EKG:  EKG is ordered today. The ekg ordered today shows sinus rhythm at 60 bpm, with PAC's, septal infarct, age undetermined. PR int 180 ms, QRS 78 ms, QTc 412 ms.    Wt Readings from Last 3 Encounters:  01/28/15 151 lb 3.2 oz (68.584 kg)  10/31/14 149 lb 12.8 oz (67.949 kg)  07/31/14 147 lb 7.8 oz (66.9 kg)      ASSESSMENT AND PLAN:  1.  afib Doing well s/p recent ablation (initial ablation 2013, more recently ablated 12/15) She is maintaining sinus rhythm off of AAD therapy, except for a few breakthrough episodes of afib which usually is resolved with an additional metoprolol within a few hours. chads2vasc score is at least 3  2. HTN Stable, with an occasional lower sys BP reading and we discussed how she could hold one dose of losartan or take half the usual dose if that should occur.   Labs/ tests ordered today include:  Orders Placed This Encounter  Procedures  . EKG 12-Lead    Follow-up with Dr. Rayann Heman as already scheduled in September. Follow-up with Dr Radford Pax as scheduled  Signed, Roderic Palau, NP  01/28/2015 11:16 AM

## 2015-01-30 ENCOUNTER — Encounter: Payer: Self-pay | Admitting: Internal Medicine

## 2015-02-20 ENCOUNTER — Ambulatory Visit (INDEPENDENT_AMBULATORY_CARE_PROVIDER_SITE_OTHER): Payer: Medicare Other | Admitting: *Deleted

## 2015-02-20 DIAGNOSIS — I4891 Unspecified atrial fibrillation: Secondary | ICD-10-CM | POA: Diagnosis not present

## 2015-02-20 DIAGNOSIS — I4819 Other persistent atrial fibrillation: Secondary | ICD-10-CM

## 2015-02-20 DIAGNOSIS — Z5181 Encounter for therapeutic drug level monitoring: Secondary | ICD-10-CM

## 2015-02-20 DIAGNOSIS — I481 Persistent atrial fibrillation: Secondary | ICD-10-CM | POA: Diagnosis not present

## 2015-02-20 LAB — POCT INR: INR: 1.9

## 2015-02-25 ENCOUNTER — Ambulatory Visit (INDEPENDENT_AMBULATORY_CARE_PROVIDER_SITE_OTHER): Payer: Medicare Other | Admitting: *Deleted

## 2015-02-25 ENCOUNTER — Encounter: Payer: Self-pay | Admitting: Internal Medicine

## 2015-02-25 DIAGNOSIS — I480 Paroxysmal atrial fibrillation: Secondary | ICD-10-CM | POA: Diagnosis not present

## 2015-02-26 NOTE — Progress Notes (Signed)
Loop recorder 

## 2015-03-25 LAB — CUP PACEART REMOTE DEVICE CHECK: Date Time Interrogation Session: 20160808141135

## 2015-03-26 ENCOUNTER — Ambulatory Visit (INDEPENDENT_AMBULATORY_CARE_PROVIDER_SITE_OTHER): Payer: Medicare Other | Admitting: *Deleted

## 2015-03-26 DIAGNOSIS — I480 Paroxysmal atrial fibrillation: Secondary | ICD-10-CM

## 2015-03-28 NOTE — Progress Notes (Signed)
Loop recorder 

## 2015-04-01 LAB — CUP PACEART REMOTE DEVICE CHECK: MDC IDC SESS DTM: 20160815145533

## 2015-04-03 ENCOUNTER — Ambulatory Visit (INDEPENDENT_AMBULATORY_CARE_PROVIDER_SITE_OTHER): Payer: Medicare Other | Admitting: *Deleted

## 2015-04-03 DIAGNOSIS — I4891 Unspecified atrial fibrillation: Secondary | ICD-10-CM | POA: Diagnosis not present

## 2015-04-03 DIAGNOSIS — I481 Persistent atrial fibrillation: Secondary | ICD-10-CM

## 2015-04-03 DIAGNOSIS — I4819 Other persistent atrial fibrillation: Secondary | ICD-10-CM

## 2015-04-03 DIAGNOSIS — Z5181 Encounter for therapeutic drug level monitoring: Secondary | ICD-10-CM | POA: Diagnosis not present

## 2015-04-03 LAB — POCT INR: INR: 1.9

## 2015-04-11 ENCOUNTER — Encounter: Payer: Self-pay | Admitting: Internal Medicine

## 2015-04-17 ENCOUNTER — Ambulatory Visit (INDEPENDENT_AMBULATORY_CARE_PROVIDER_SITE_OTHER): Payer: Medicare Other | Admitting: *Deleted

## 2015-04-17 DIAGNOSIS — I4819 Other persistent atrial fibrillation: Secondary | ICD-10-CM

## 2015-04-17 DIAGNOSIS — I4891 Unspecified atrial fibrillation: Secondary | ICD-10-CM

## 2015-04-17 DIAGNOSIS — Z5181 Encounter for therapeutic drug level monitoring: Secondary | ICD-10-CM | POA: Diagnosis not present

## 2015-04-17 DIAGNOSIS — I481 Persistent atrial fibrillation: Secondary | ICD-10-CM

## 2015-04-17 LAB — POCT INR: INR: 2.1

## 2015-04-25 ENCOUNTER — Ambulatory Visit (INDEPENDENT_AMBULATORY_CARE_PROVIDER_SITE_OTHER): Payer: Medicare Other | Admitting: *Deleted

## 2015-04-25 DIAGNOSIS — I480 Paroxysmal atrial fibrillation: Secondary | ICD-10-CM

## 2015-04-25 LAB — CUP PACEART REMOTE DEVICE CHECK: Date Time Interrogation Session: 20160909090938

## 2015-04-26 NOTE — Progress Notes (Signed)
Carelink summary report received. Battery status OK. Normal device function. No new symptom episodes, tachy episodes, brady, or pause episodes. 78 AF episodes (2.0%), +warfarin, V rates relatively well controlled. Carelink AF alerts changed to AF management alerts. Monthly summary reports and ROV with JA on 05/06/2015 at 10:00am.

## 2015-05-06 ENCOUNTER — Ambulatory Visit (INDEPENDENT_AMBULATORY_CARE_PROVIDER_SITE_OTHER): Payer: Medicare Other | Admitting: Pharmacist

## 2015-05-06 ENCOUNTER — Encounter: Payer: Self-pay | Admitting: Internal Medicine

## 2015-05-06 ENCOUNTER — Ambulatory Visit (INDEPENDENT_AMBULATORY_CARE_PROVIDER_SITE_OTHER): Payer: Medicare Other | Admitting: Internal Medicine

## 2015-05-06 VITALS — BP 162/82 | HR 59 | Ht 63.0 in | Wt 150.8 lb

## 2015-05-06 DIAGNOSIS — Z5181 Encounter for therapeutic drug level monitoring: Secondary | ICD-10-CM

## 2015-05-06 DIAGNOSIS — I481 Persistent atrial fibrillation: Secondary | ICD-10-CM | POA: Diagnosis not present

## 2015-05-06 DIAGNOSIS — I480 Paroxysmal atrial fibrillation: Secondary | ICD-10-CM

## 2015-05-06 DIAGNOSIS — I4819 Other persistent atrial fibrillation: Secondary | ICD-10-CM

## 2015-05-06 DIAGNOSIS — I10 Essential (primary) hypertension: Secondary | ICD-10-CM | POA: Diagnosis not present

## 2015-05-06 DIAGNOSIS — I4891 Unspecified atrial fibrillation: Secondary | ICD-10-CM | POA: Diagnosis not present

## 2015-05-06 LAB — POCT INR: INR: 3.5

## 2015-05-06 MED ORDER — LOSARTAN POTASSIUM 50 MG PO TABS: 75.0000 mg | ORAL_TABLET | Freq: Every day | ORAL | Status: DC

## 2015-05-06 NOTE — Patient Instructions (Signed)
Medication Instructions:  Your physician has recommended you make the following change in your medication:  1) Increase Losartan to 75mg  daily    Labwork: None ordered  Testing/Procedures: None ordered  Follow-Up: Your physician recommends that you schedule a follow-up appointment in: 3 months with Roderic Palau, NP   Any Other Special Instructions Will Be Listed Below (If Applicable).

## 2015-05-06 NOTE — Progress Notes (Signed)
Electrophysiology Office Note   Date:  05/06/2015   ID:  Monica [name], DOB [birth year], MRN [id number]  PCP:  Monica Naas, MD  Cardiologist:  Dr Radford Pax Primary Electrophysiologist: Thompson Grayer, MD    Chief Complaint  Patient presents with  . PAF     History of Present Illness: Monica [name] is a 74 y.o. female who presents today for electrophysiology evaluation.   She has rare episodes of afib, mostly only several minutes in duration. Today, she denies symptoms of palpitations  shortness of breath, orthopnea, PND, lower extremity edema, claudication, dizziness, presyncope, syncope, bleeding, or neurologic sequela. The patient is tolerating medications without difficulties and is otherwise without complaint today.    Past Medical History  Diagnosis Date  . Normal nuclear stress test 2008    low risk  . Osteoporosis   . 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 Paroxysmal atrial fibrillation     a. PVI 10/13 b. PVI 12/15  . GERD (gastroesophageal reflux disease)   . Hypercholesteremia   . Kidney stone 6/11    rt. on ct 6/11  . Hypertension     [facility]   Past Surgical History  Procedure Laterality Date  . Cholecystectomy  1989  . Cesarean section  1978-1979    x-2  . Cardioversion  05/06/2012    Procedure: CARDIOVERSION;  Surgeon: Sueanne Margarita, MD;  Location: [facility] ENDOSCOPY;  Service: Cardiovascular;  Laterality: N/A;  h/p in file drawer  . Tee without cardioversion  06/06/2012    Procedure: TRANSESOPHAGEAL ECHOCARDIOGRAM (TEE);  Surgeon: Jettie Booze, MD;  Location: [place];  Service: Cardiovascular;  Laterality: N/A;  . Implantable loop recorder implantation  05/30/14    Medtronic Reveal LINQ implanted by Dr Rayann Heman with RIO II protocol (ambulatory setting)  . Atrial fibrillation ablation N/A 06/07/2012    PVI and CTI ablation by Dr Rayann Heman  . Tee without cardioversion N/A 07/30/2014    Procedure: TRANSESOPHAGEAL ECHOCARDIOGRAM (TEE);   Surgeon: Larey Dresser, MD;  Location: [facility] ENDOSCOPY;  Service: Cardiovascular;  Laterality: N/A;  . Atrial fibrillation ablation N/A 07/31/2014    PVI by Dr Rayann Heman; initial ablation 2013     Current Outpatient Prescriptions  Medication Sig Dispense Refill  . acetaminophen (TYLENOL) 500 MG tablet Take 500 mg by mouth every 6 (six) hours as needed. For pain    . calcium carbonate (OS-CAL) 600 MG TABS tablet Take 600 mg by mouth 2 (two) times daily with a meal.     . cholecalciferol (VITAMIN D) 1000 UNITS tablet Take 1,000 Units by mouth every evening.     Marland Kitchen losartan (COZAAR) 50 MG tablet Take 50-75 mg by mouth daily.    . metoprolol tartrate (LOPRESSOR) 25 MG tablet TAKE 1 & 1/2 TABLETS BY MOUTH TWICE A DAY 270 tablet 2  . omeprazole (PRILOSEC) 40 MG capsule Take 40 mg by mouth daily.  1  . pravastatin (PRAVACHOL) 20 MG tablet Take 20 mg by mouth daily.  1  . traZODone (DESYREL) 50 MG tablet Take 25-50 mg by mouth at bedtime as needed for [name].     Marland Kitchen warfarin (COUMADIN) 5 MG tablet TAKE AS DIRECTED BY THE COUMADIN CLINIC 30 tablet 3   No current facility-administered medications for this visit.    Allergies:   Codeine   Social History:  The patient  reports that she has never smoked. She does not have any smokeless tobacco history on file. She reports that  she does not drink alcohol or use illicit drugs.   Family History:  The patient's  family history includes Atrial fibrillation in her sister; CAD in her father; Heart attack in her brother and father; Hip fracture in her mother; Lung cancer in her brother; Other in her brother; Ovarian cancer in her sister; Thyroid cancer in her sister.    ROS:  Please see the history of present illness.   All other systems are reviewed and negative.    PHYSICAL EXAM: VS:  BP 162/82 mmHg  Pulse 59  Ht 5\' 3"  (1.6 m)  Wt 150 lb 12.8 oz (68.402 kg)  BMI 26.72 kg/m2 , BMI Body mass index is 26.72 kg/(m^2). GEN: Well nourished, well developed, in  no acute distress HEENT: normal Neck: no JVD, carotid bruits, or masses Cardiac: RRR; no murmurs, rubs, or gallops,no edema  Respiratory:  clear to auscultation bilaterally, normal work of breathing GI: soft, nontender, nondistended, + BS MS: no deformity or atrophy Skin: warm and dry  Neuro:  Strength and sensation are intact Psych: euthymic mood, full affect  EKG:  EKG is ordered today. The ekg ordered today shows sinus rhythm  ILR interrogation is reviewed (see paceart)   Recent Labs: 07/24/2014: Hemoglobin 11.8*; Platelets 265.0 08/01/2014: BUN 14; Creatinine, Ser 0.74; Potassium 4.1; Sodium 139    Lipid Panel  No results found for: CHOL, TRIG, HDL, CHOLHDL, VLDL, LDLCALC, LDLDIRECT   Wt Readings from Last 3 Encounters:  05/06/15 150 lb 12.8 oz (68.402 kg)  01/28/15 151 lb 3.2 oz (68.584 kg)  10/31/14 149 lb 12.8 oz (67.949 kg)      ASSESSMENT AND PLAN:  1.  afib Doing well s/p recent ablation (initial ablation 2013, more recently ablated 12/15) She is maintaining sinus rhythm off of AAD therapy with afib burden only 1.%.  No indication to make changes at this time. chads2vasc score is at least 3 Continue long term anticoagulation  2. HTN Stable No change required today   Current medicines are reviewed at length with the patient today.   The patient does not have concerns regarding her medicines.  The following changes were made today:  none  Labs/ tests ordered today include:  No orders of the defined types were placed in this encounter.    Follow-up follow-up with Roderic Palau NP every 3 months and I will see when needed Follow-up with Dr Radford Pax as scheduled  Signed, Thompson Grayer, MD  05/06/2015 10:43 AM     [facility] [address] [place]  [place] [address] [phone] (office) [phone] (fax)

## 2015-05-07 LAB — CUP PACEART INCLINIC DEVICE CHECK
MDC IDC SESS DTM: 20160919142525
MDC IDC SET ZONE DETECTION INTERVAL: 3000 ms
MDC IDC SET ZONE DETECTION INTERVAL: 380 ms
Zone Setting Detection Interval: 2000 ms

## 2015-05-27 ENCOUNTER — Ambulatory Visit (INDEPENDENT_AMBULATORY_CARE_PROVIDER_SITE_OTHER): Payer: Medicare Other | Admitting: *Deleted

## 2015-05-27 ENCOUNTER — Encounter: Payer: Self-pay | Admitting: Internal Medicine

## 2015-05-27 DIAGNOSIS — I4891 Unspecified atrial fibrillation: Secondary | ICD-10-CM

## 2015-05-28 NOTE — Progress Notes (Signed)
LOOP RECORDER  

## 2015-06-03 ENCOUNTER — Ambulatory Visit (INDEPENDENT_AMBULATORY_CARE_PROVIDER_SITE_OTHER): Payer: Medicare Other | Admitting: *Deleted

## 2015-06-03 DIAGNOSIS — Z5181 Encounter for therapeutic drug level monitoring: Secondary | ICD-10-CM

## 2015-06-03 DIAGNOSIS — I4891 Unspecified atrial fibrillation: Secondary | ICD-10-CM

## 2015-06-03 LAB — POCT INR: INR: 2.5

## 2015-06-14 LAB — CUP PACEART REMOTE DEVICE CHECK: MDC IDC SESS DTM: 20161008170607

## 2015-06-14 NOTE — Progress Notes (Signed)
Carelink summary report received. Battery status OK. Normal device function. No new symptom episodes, tachy episodes, brady, or pause episodes. 61 AF episodes (burden 2.7%), +warfarin, avg V rates controlled. Monthly summary reports and ROV with AF clinic on 08/13/15 at 11:00am.

## 2015-06-24 ENCOUNTER — Telehealth: Payer: Self-pay | Admitting: Internal Medicine

## 2015-06-24 ENCOUNTER — Ambulatory Visit (INDEPENDENT_AMBULATORY_CARE_PROVIDER_SITE_OTHER): Payer: Medicare Other | Admitting: *Deleted

## 2015-06-24 DIAGNOSIS — I480 Paroxysmal atrial fibrillation: Secondary | ICD-10-CM | POA: Diagnosis not present

## 2015-06-24 NOTE — Telephone Encounter (Signed)
LMOVM informing pt that we did receive her transmission.

## 2015-06-24 NOTE — Telephone Encounter (Signed)
New Message ° °4. Are you calling to see if we received your device transmission? Yes  ° °

## 2015-06-25 NOTE — Progress Notes (Signed)
Carelink Summary Report / Loop recorder 

## 2015-06-29 ENCOUNTER — Other Ambulatory Visit: Payer: Self-pay | Admitting: Cardiology

## 2015-07-01 ENCOUNTER — Ambulatory Visit (INDEPENDENT_AMBULATORY_CARE_PROVIDER_SITE_OTHER): Payer: Medicare Other | Admitting: *Deleted

## 2015-07-01 DIAGNOSIS — Z5181 Encounter for therapeutic drug level monitoring: Secondary | ICD-10-CM

## 2015-07-01 DIAGNOSIS — I4891 Unspecified atrial fibrillation: Secondary | ICD-10-CM

## 2015-07-01 LAB — POCT INR: INR: 2.8

## 2015-07-12 LAB — CUP PACEART REMOTE DEVICE CHECK: MDC IDC SESS DTM: 20161107170718

## 2015-07-24 ENCOUNTER — Ambulatory Visit (INDEPENDENT_AMBULATORY_CARE_PROVIDER_SITE_OTHER): Payer: Medicare Other | Admitting: *Deleted

## 2015-07-24 DIAGNOSIS — I4891 Unspecified atrial fibrillation: Secondary | ICD-10-CM

## 2015-07-25 NOTE — Progress Notes (Signed)
Carelink Summary Report / Loop Recorder 

## 2015-07-29 ENCOUNTER — Ambulatory Visit (INDEPENDENT_AMBULATORY_CARE_PROVIDER_SITE_OTHER): Payer: Medicare Other

## 2015-07-29 DIAGNOSIS — Z5181 Encounter for therapeutic drug level monitoring: Secondary | ICD-10-CM | POA: Diagnosis not present

## 2015-07-29 DIAGNOSIS — I4891 Unspecified atrial fibrillation: Secondary | ICD-10-CM | POA: Diagnosis not present

## 2015-07-29 LAB — POCT INR: INR: 3.1

## 2015-08-13 ENCOUNTER — Encounter (HOSPITAL_COMMUNITY): Payer: Self-pay | Admitting: Nurse Practitioner

## 2015-08-13 ENCOUNTER — Ambulatory Visit (HOSPITAL_COMMUNITY)
Admission: RE | Admit: 2015-08-13 | Discharge: 2015-08-13 | Disposition: A | Discharge status: Home / Self Care | Payer: Medicare Other | Source: Ambulatory Visit | Attending: Nurse Practitioner | Admitting: Nurse Practitioner

## 2015-08-13 VITALS — BP 152/80 | HR 55 | Ht 63.0 in | Wt 151.2 lb

## 2015-08-13 DIAGNOSIS — I480 Paroxysmal atrial fibrillation: Secondary | ICD-10-CM | POA: Diagnosis not present

## 2015-08-13 DIAGNOSIS — I4891 Unspecified atrial fibrillation: Secondary | ICD-10-CM | POA: Insufficient documentation

## 2015-08-13 NOTE — Progress Notes (Signed)
Patient ID: Monica [name], female   DOB: December 05, 1940, 74 y.o.   MRN: [id number]     Primary Care Physician: Reginia Naas, MD Referring Physician:Dr.Allred   Monica [name] is a 74 y.o. female with a h/o afib s/p ablation x2, 2013 and 07/2014 here for f/u. She has minimal afib burden. Is on metoprolol and warfarin. Last episode was 12/9, went to bed in afib and when woke up next am, afib resolved.No bleeding issues.  Today, she denies symptoms of palpitations, chest pain, shortness of breath, orthopnea, PND, lower extremity edema, dizziness, presyncope, syncope, or neurologic sequela. The patient is tolerating medications without difficulties and is otherwise without complaint today.   Past Medical History  Diagnosis Date  . Normal nuclear stress test 2008    low risk  . Osteoporosis   . 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 Paroxysmal atrial fibrillation ([place])     a. PVI 10/13 b. PVI 12/15  . GERD (gastroesophageal reflux disease)   . Hypercholesteremia   . Kidney stone 6/11    rt. on ct 6/11  . Hypertension     [facility]   Past Surgical History  Procedure Laterality Date  . Cholecystectomy  1989  . Cesarean section  1978-1979    x-2  . Cardioversion  05/06/2012    Procedure: CARDIOVERSION;  Surgeon: Sueanne Margarita, MD;  Location: [facility] ENDOSCOPY;  Service: Cardiovascular;  Laterality: N/A;  h/p in file drawer  . Tee without cardioversion  06/06/2012    Procedure: TRANSESOPHAGEAL ECHOCARDIOGRAM (TEE);  Surgeon: Jettie Booze, MD;  Location: [place];  Service: Cardiovascular;  Laterality: N/A;  . Implantable loop recorder implantation  05/30/14    Medtronic Reveal LINQ implanted by Dr Rayann Heman with RIO II protocol (ambulatory setting)  . Atrial fibrillation ablation N/A 06/07/2012    PVI and CTI ablation by Dr Rayann Heman  . Tee without cardioversion N/A 07/30/2014    Procedure: TRANSESOPHAGEAL ECHOCARDIOGRAM (TEE);  Surgeon: Larey Dresser, MD;  [facility]  ENDOSCOPY;  Service: Cardiovascular;  Laterality: N/A;  . Atrial fibrillation ablation N/A 07/31/2014    PVI by Dr Rayann Heman; initial ablation 2013    Current Outpatient Prescriptions  Medication Sig Dispense Refill  . acetaminophen (TYLENOL) 500 MG tablet Take 500 mg by mouth every 6 (six) hours as needed. For pain    . calcium carbonate (OS-CAL) 600 MG TABS tablet Take 600 mg by mouth 2 (two) times daily with a meal.     . cholecalciferol (VITAMIN D) 1000 UNITS tablet Take 1,000 Units by mouth every evening.     Marland Kitchen losartan (COZAAR) 50 MG tablet Take 1.5 tablets (75 mg total) by mouth daily. 45 tablet 11  . metoprolol tartrate (LOPRESSOR) 25 MG tablet TAKE 1 & 1/2 TABLETS BY MOUTH TWICE A DAY 270 tablet 2  . omeprazole (PRILOSEC) 40 MG capsule Take 40 mg by mouth daily.  1  . pravastatin (PRAVACHOL) 20 MG tablet Take 20 mg by mouth daily.  1  . traZODone (DESYREL) 50 MG tablet Take 25-50 mg by mouth at bedtime as needed for sleep.     Marland Kitchen warfarin (COUMADIN) 5 MG tablet TAKE AS DIRECTED BY THE COUMADIN CLINIC 30 tablet 3   No current facility-administered medications for this encounter.    Allergies  Allergen Reactions  . Codeine Nausea And Vomiting    Social History   Social History  . Marital Status: Widowed    Spouse Name: N/A  .  Number of Children: N/A  . Years of Education: N/A   Occupational History  . Not on file.   Social History Main Topics  . Smoking status: Never Smoker   . Smokeless tobacco: Not on file  . Alcohol Use: No  . Drug Use: No  . Sexual Activity: Not on file   Other Topics Concern  . 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   [place]   Marital Status:single,widowed,2006.    Family History  Problem Relation Age of Onset  . Heart attack Father   . CAD Father   . Hip fracture Mother     complication  . Lung cancer Brother   . Heart attack Brother     massive  .  Other Brother     mva  . Thyroid cancer Sister   . Atrial fibrillation Sister   . Ovarian cancer Sister     surviver    ROS- All systems are reviewed and negative except as per the HPI above  Physical Exam: Filed Vitals:   08/13/15 1109  BP: 152/80  Pulse: 55  Height: 5\' 3"  (1.6 m)  Weight: 151 lb 3.2 oz (68.584 kg)    GEN- The patient is well appearing, alert and oriented x 3 today.   Head- normocephalic, atraumatic Eyes-  Sclera clear, conjunctiva pink Ears- hearing intact Oropharynx- clear Neck- supple, no JVP Lymph- no cervical lymphadenopathy Lungs- Clear to ausculation bilaterally, normal work of breathing Heart- Regular rate and rhythm, no murmurs, rubs or gallops, PMI not laterally displaced GI- soft, NT, ND, + BS Extremities- no clubbing, cyanosis, or edema MS- no significant deformity or atrophy Skin- no rash or lesion Psych- euthymic mood, full affect Neuro- strength and sensation are intact  EKG-Sinus brady at 55 bpm, Pr int 184 ms, QRS int 74 ms, QTc 415 ms Epic records reviewed  Assessment and Plan: 1. Afib S/p ablation x 2 with low afib burden, linq reporting burden at 3.4% Continue metoprolol Continue warfarin, last INR at 3.1, 12/12  F/u in afib clinic in 6 months   Butch Penny C. Carroll, [facility] [address] [place], [place] [address] [phone]

## 2015-08-23 ENCOUNTER — Ambulatory Visit (INDEPENDENT_AMBULATORY_CARE_PROVIDER_SITE_OTHER): Payer: Medicare Other | Admitting: *Deleted

## 2015-08-23 DIAGNOSIS — I480 Paroxysmal atrial fibrillation: Secondary | ICD-10-CM

## 2015-08-23 NOTE — Progress Notes (Signed)
Carelink Summary Report / Loop Recorder 

## 2015-08-28 LAB — CUP PACEART REMOTE DEVICE CHECK: Date Time Interrogation Session: 20161207170740

## 2015-08-28 NOTE — Progress Notes (Signed)
Carelink summary report received. Battery status OK. Normal device function. 0.7% AF - post PVI 07/2014, +Warfarin. V rates slightly elevated but episodes are short. No new symptom episodes, tachy episodes, brady, or pause episodes.  Monthly summary reports and ROV/PRN

## 2015-08-30 ENCOUNTER — Ambulatory Visit (INDEPENDENT_AMBULATORY_CARE_PROVIDER_SITE_OTHER): Payer: Medicare Other | Admitting: *Deleted

## 2015-08-30 DIAGNOSIS — Z5181 Encounter for therapeutic drug level monitoring: Secondary | ICD-10-CM | POA: Diagnosis not present

## 2015-08-30 DIAGNOSIS — I4891 Unspecified atrial fibrillation: Secondary | ICD-10-CM | POA: Diagnosis not present

## 2015-08-30 LAB — POCT INR: INR: 2.5

## 2015-09-12 ENCOUNTER — Other Ambulatory Visit: Payer: Self-pay

## 2015-09-12 MED ORDER — LOSARTAN POTASSIUM 50 MG PO TABS: 75.0000 mg | ORAL_TABLET | Freq: Every day | ORAL | Status: DC

## 2015-09-21 ENCOUNTER — Other Ambulatory Visit: Payer: Self-pay | Admitting: Internal Medicine

## 2015-09-23 ENCOUNTER — Ambulatory Visit (INDEPENDENT_AMBULATORY_CARE_PROVIDER_SITE_OTHER): Payer: Medicare Other | Admitting: *Deleted

## 2015-09-23 DIAGNOSIS — I4891 Unspecified atrial fibrillation: Secondary | ICD-10-CM | POA: Diagnosis not present

## 2015-09-24 NOTE — Progress Notes (Signed)
Carelink Summary Report / Loop Recorder 

## 2015-09-27 ENCOUNTER — Ambulatory Visit (INDEPENDENT_AMBULATORY_CARE_PROVIDER_SITE_OTHER): Payer: Medicare Other | Admitting: *Deleted

## 2015-09-27 DIAGNOSIS — Z5181 Encounter for therapeutic drug level monitoring: Secondary | ICD-10-CM

## 2015-09-27 DIAGNOSIS — I4891 Unspecified atrial fibrillation: Secondary | ICD-10-CM

## 2015-09-27 LAB — POCT INR: INR: 3.2

## 2015-10-04 LAB — CUP PACEART REMOTE DEVICE CHECK: MDC IDC SESS DTM: 20170106173706

## 2015-10-04 NOTE — Progress Notes (Signed)
Carelink summary report received. Battery status OK. Normal device function. No new symptom episodes, tachy episodes, brady, or pause episodes. 2.8% AF, V rates elevated, on Metoprol and Warfarin.  Monthly summary reports and ROV/PRN

## 2015-10-18 ENCOUNTER — Ambulatory Visit (INDEPENDENT_AMBULATORY_CARE_PROVIDER_SITE_OTHER): Payer: Medicare Other | Admitting: *Deleted

## 2015-10-18 DIAGNOSIS — Z5181 Encounter for therapeutic drug level monitoring: Secondary | ICD-10-CM | POA: Diagnosis not present

## 2015-10-18 DIAGNOSIS — I4891 Unspecified atrial fibrillation: Secondary | ICD-10-CM

## 2015-10-18 LAB — POCT INR: INR: 3.6

## 2015-10-24 ENCOUNTER — Ambulatory Visit (INDEPENDENT_AMBULATORY_CARE_PROVIDER_SITE_OTHER): Payer: Medicare Other | Admitting: *Deleted

## 2015-10-24 DIAGNOSIS — I4891 Unspecified atrial fibrillation: Secondary | ICD-10-CM | POA: Diagnosis not present

## 2015-10-25 NOTE — Progress Notes (Signed)
Carelink Summary Report / Loop Recorder 

## 2015-10-26 LAB — CUP PACEART REMOTE DEVICE CHECK: MDC IDC SESS DTM: 20170307180701

## 2015-10-26 NOTE — Progress Notes (Signed)
Carelink summary report received. Battery status OK. Normal device function. No new symptom episodes, tachy episodes, brady, or pause episodes. 1.9% AF, burden stable, +Warfarin, V rates slightly elevated. Monthly summary reports and ROV/PRN

## 2015-11-01 ENCOUNTER — Ambulatory Visit (INDEPENDENT_AMBULATORY_CARE_PROVIDER_SITE_OTHER): Payer: Medicare Other | Admitting: *Deleted

## 2015-11-01 DIAGNOSIS — Z5181 Encounter for therapeutic drug level monitoring: Secondary | ICD-10-CM | POA: Diagnosis not present

## 2015-11-01 DIAGNOSIS — I4891 Unspecified atrial fibrillation: Secondary | ICD-10-CM

## 2015-11-01 LAB — POCT INR: INR: 2.7

## 2015-11-21 ENCOUNTER — Ambulatory Visit (INDEPENDENT_AMBULATORY_CARE_PROVIDER_SITE_OTHER): Payer: Medicare Other | Admitting: *Deleted

## 2015-11-21 DIAGNOSIS — I4891 Unspecified atrial fibrillation: Secondary | ICD-10-CM | POA: Diagnosis not present

## 2015-11-21 DIAGNOSIS — I480 Paroxysmal atrial fibrillation: Secondary | ICD-10-CM

## 2015-11-21 DIAGNOSIS — Z5181 Encounter for therapeutic drug level monitoring: Secondary | ICD-10-CM

## 2015-11-21 LAB — POCT INR: INR: 2.5

## 2015-11-21 LAB — CUP PACEART REMOTE DEVICE CHECK: MDC IDC SESS DTM: 20170205180609

## 2015-11-21 NOTE — Progress Notes (Signed)
Carelink summary report received. Battery status OK. Normal device function. No new symptom episodes, tachy episodes, brady, or pause episodes. 36 AF 1.2%- known AF +Warfarin, +Metoprolol. Monthly summary reports and ROV/PRN

## 2015-11-22 NOTE — Progress Notes (Signed)
Carelink Summary Report / Loop Recorder 

## 2015-12-19 ENCOUNTER — Ambulatory Visit (INDEPENDENT_AMBULATORY_CARE_PROVIDER_SITE_OTHER): Payer: Medicare Other | Admitting: *Deleted

## 2015-12-19 DIAGNOSIS — Z5181 Encounter for therapeutic drug level monitoring: Secondary | ICD-10-CM | POA: Diagnosis not present

## 2015-12-19 DIAGNOSIS — I4891 Unspecified atrial fibrillation: Secondary | ICD-10-CM | POA: Diagnosis not present

## 2015-12-19 LAB — POCT INR: INR: 2.1

## 2015-12-23 ENCOUNTER — Ambulatory Visit (INDEPENDENT_AMBULATORY_CARE_PROVIDER_SITE_OTHER): Payer: Medicare Other | Admitting: *Deleted

## 2015-12-23 DIAGNOSIS — I4891 Unspecified atrial fibrillation: Secondary | ICD-10-CM | POA: Diagnosis not present

## 2015-12-24 NOTE — Progress Notes (Signed)
Carelink Summary Report / Loop Recorder 

## 2016-01-11 LAB — CUP PACEART REMOTE DEVICE CHECK: MDC IDC SESS DTM: 20170406181008

## 2016-01-11 NOTE — Progress Notes (Signed)
Carelink summary report received. Battery status OK. Normal device function. No new symptom episodes, tachy episodes, brady, or pause episodes. 2.9% AF burden, +Warfarin, V rates controlled. Monthly summary reports and ROV/PRN

## 2016-01-16 ENCOUNTER — Ambulatory Visit (INDEPENDENT_AMBULATORY_CARE_PROVIDER_SITE_OTHER): Payer: Medicare Other

## 2016-01-16 DIAGNOSIS — Z5181 Encounter for therapeutic drug level monitoring: Secondary | ICD-10-CM | POA: Diagnosis not present

## 2016-01-16 DIAGNOSIS — I4891 Unspecified atrial fibrillation: Secondary | ICD-10-CM

## 2016-01-16 LAB — POCT INR: INR: 2

## 2016-01-20 ENCOUNTER — Ambulatory Visit (INDEPENDENT_AMBULATORY_CARE_PROVIDER_SITE_OTHER): Payer: Medicare Other | Admitting: *Deleted

## 2016-01-20 DIAGNOSIS — I4891 Unspecified atrial fibrillation: Secondary | ICD-10-CM | POA: Diagnosis not present

## 2016-01-21 NOTE — Progress Notes (Signed)
Carelink Summary Report / Loop Recorder 

## 2016-01-30 LAB — CUP PACEART REMOTE DEVICE CHECK: Date Time Interrogation Session: 20170506183939

## 2016-02-19 ENCOUNTER — Ambulatory Visit (INDEPENDENT_AMBULATORY_CARE_PROVIDER_SITE_OTHER): Payer: Medicare Other | Admitting: *Deleted

## 2016-02-19 ENCOUNTER — Encounter (HOSPITAL_COMMUNITY): Payer: Self-pay | Admitting: Nurse Practitioner

## 2016-02-19 ENCOUNTER — Ambulatory Visit (HOSPITAL_COMMUNITY)
Admission: RE | Admit: 2016-02-19 | Discharge: 2016-02-19 | Disposition: A | Discharge status: Home / Self Care | Payer: Medicare Other | Source: Ambulatory Visit | Attending: Nurse Practitioner | Admitting: Nurse Practitioner

## 2016-02-19 VITALS — BP 144/80 | HR 60 | Ht 63.0 in | Wt 151.0 lb

## 2016-02-19 DIAGNOSIS — Z7901 Long term (current) use of anticoagulants: Secondary | ICD-10-CM | POA: Diagnosis not present

## 2016-02-19 DIAGNOSIS — K219 Gastro-esophageal reflux disease without esophagitis: Secondary | ICD-10-CM | POA: Diagnosis not present

## 2016-02-19 DIAGNOSIS — I10 Essential (primary) hypertension: Secondary | ICD-10-CM | POA: Diagnosis not present

## 2016-02-19 DIAGNOSIS — Z79899 Other long term (current) drug therapy: Secondary | ICD-10-CM | POA: Diagnosis not present

## 2016-02-19 DIAGNOSIS — I4891 Unspecified atrial fibrillation: Secondary | ICD-10-CM

## 2016-02-19 DIAGNOSIS — E7800 Pure hypercholesterolemia, unspecified: Secondary | ICD-10-CM | POA: Diagnosis not present

## 2016-02-19 DIAGNOSIS — I480 Paroxysmal atrial fibrillation: Secondary | ICD-10-CM | POA: Insufficient documentation

## 2016-02-19 NOTE — Progress Notes (Signed)
Patient ID: Monica [name], female   DOB: 29-May-1941, 75 y.o.   MRN: [id number]     Primary Care Physician: Reginia Naas, MD Referring Physician:Dr.Allred   Monica [name] is a 75 y.o. female with a h/o afib s/p ablation x2, 2013 and 07/2014 here for f/u. She has minimal afib burden. Has a Linq and on last review, 5/17, burden was around 3%. Is on metoprolol and warfarin for chadsvasc score of at least 4. Last episode was this past Mon/Tues, but today is back in rhythm.  Usually will take extra metoprolol.No bleeding issues.  Today, she denies symptoms of palpitations, chest pain, shortness of breath, orthopnea, PND, lower extremity edema, dizziness, presyncope, syncope, or neurologic sequela. The patient is tolerating medications without difficulties and is otherwise without complaint today.   Past Medical History  Diagnosis Date  . Normal nuclear stress test 2008    low risk  . Osteoporosis   . 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 Paroxysmal atrial fibrillation ([place])     a. PVI 10/13 b. PVI 12/15  . GERD (gastroesophageal reflux disease)   . Hypercholesteremia   . Kidney stone 6/11    rt. on ct 6/11  . Hypertension     [facility]   Past Surgical History  Procedure Laterality Date  . Cholecystectomy  1989  . Cesarean section  1978-1979    x-2  . Cardioversion  05/06/2012    Procedure: CARDIOVERSION;  Surgeon: Sueanne Margarita, MD;  Location: [facility] ENDOSCOPY;  Service: Cardiovascular;  Laterality: N/A;  h/p in file drawer  . Tee without cardioversion  06/06/2012    Procedure: TRANSESOPHAGEAL ECHOCARDIOGRAM (TEE);  Surgeon: Jettie Booze, MD;  Location: [place];  Service: Cardiovascular;  Laterality: N/A;  . Implantable loop recorder implantation  05/30/14    Medtronic Reveal LINQ implanted by Dr Rayann Heman with RIO II protocol (ambulatory setting)  . Atrial fibrillation ablation N/A 06/07/2012    PVI and CTI ablation by Dr Rayann Heman  . Tee without cardioversion N/A  07/30/2014    Procedure: TRANSESOPHAGEAL ECHOCARDIOGRAM (TEE);  Surgeon: Larey Dresser, MD;  Location: [facility] ENDOSCOPY;  Service: Cardiovascular;  Laterality: N/A;  . Atrial fibrillation ablation N/A 07/31/2014    PVI by Dr Rayann Heman; initial ablation 2013    Current Outpatient Prescriptions  Medication Sig Dispense Refill  . acetaminophen (TYLENOL) 500 MG tablet Take 500 mg by mouth every 6 (six) hours as needed. For pain    . calcium carbonate (OS-CAL) 600 MG TABS tablet Take 600 mg by mouth 2 (two) times daily with a meal.     . cholecalciferol (VITAMIN D) 1000 UNITS tablet Take 1,000 Units by mouth every evening.     Marland Kitchen losartan (COZAAR) 50 MG tablet Take 1.5 tablets (75 mg total) by mouth daily. 45 tablet 11  . metoprolol tartrate (LOPRESSOR) 25 MG tablet TAKE 1 & 1/2 TABLETS BY MOUTH TWICE A DAY 270 tablet 2  . metoprolol tartrate (LOPRESSOR) 25 MG tablet TAKE 1 & 1/2 TABLETS BY MOUTH TWICE A DAY 270 tablet 2  . pantoprazole (PROTONIX) 40 MG tablet Take 40 mg by mouth daily.    . pravastatin (PRAVACHOL) 20 MG tablet Take 20 mg by mouth daily.  1  . traZODone (DESYREL) 50 MG tablet Take 25-50 mg by mouth at bedtime as needed for sleep.     Marland Kitchen warfarin (COUMADIN) 5 MG tablet TAKE AS DIRECTED BY THE COUMADIN CLINIC 30 tablet 3   No  current facility-administered medications for this encounter.    Allergies  Allergen Reactions  . Codeine Nausea And Vomiting    Social History   Social History  . Marital Status: Widowed    Spouse Name: N/A  . Number of Children: N/A  . Years of Education: N/A   Occupational History  . Not on file.   Social History Main Topics  . Smoking status: Never Smoker   . Smokeless tobacco: Not on file  . Alcohol Use: No  . Drug Use: No  . Sexual Activity: Not on file   Other Topics Concern  . 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   [place]   Marital  Status:single,widowed,2006.    Family History  Problem Relation Age of Onset  . Heart attack Father   . CAD Father   . Hip fracture Mother     complication  . Lung cancer Brother   . Heart attack Brother     massive  . Other Brother     mva  . Thyroid cancer Sister   . Atrial fibrillation Sister   . Ovarian cancer Sister     surviver    ROS- All systems are reviewed and negative except as per the HPI above  Physical Exam: Filed Vitals:   02/19/16 1022 02/19/16 1100  BP: 168/94 144/80  Pulse: 60   Height: 5\' 3"  (1.6 m)   Weight: 151 lb (68.493 kg)     GEN- The patient is well appearing, alert and oriented x 3 today.   Head- normocephalic, atraumatic Eyes-  Sclera clear, conjunctiva pink Ears- hearing intact Oropharynx- clear Neck- supple, no JVP Lymph- no cervical lymphadenopathy Lungs- Clear to ausculation bilaterally, normal work of breathing Heart- Regular rate and rhythm, no murmurs, rubs or gallops, PMI not laterally displaced GI- soft, NT, ND, + BS Extremities- no clubbing, cyanosis, or edema MS- no significant deformity or atrophy Skin- no rash or lesion Psych- euthymic mood, full affect Neuro- strength and sensation are intact  EKG-Sinus rhythm at 60 bpm, Pr int 188 ms, QRS int 72 ms, QTc 416 ms Epic records reviewed  Assessment and Plan: 1. Afib S/p ablation x 2 with overall  low afib burden, linq reporting burden around 3% Continue metoprolol, with extra for breakthrough afib. Continue warfarin, last INR, 6/1 @ 2.0  F/u Dr. Rayann Heman in early fall, afib clinic as needed.  Geroge Baseman Rekha Hobbins, [facility] [address] [place], [place] [address] [phone]

## 2016-02-20 NOTE — Progress Notes (Signed)
Carelink Summary Report / Loop Recorder 

## 2016-02-27 ENCOUNTER — Ambulatory Visit (INDEPENDENT_AMBULATORY_CARE_PROVIDER_SITE_OTHER): Payer: Medicare Other | Admitting: *Deleted

## 2016-02-27 DIAGNOSIS — I4891 Unspecified atrial fibrillation: Secondary | ICD-10-CM

## 2016-02-27 DIAGNOSIS — Z5181 Encounter for therapeutic drug level monitoring: Secondary | ICD-10-CM | POA: Diagnosis not present

## 2016-02-27 LAB — CUP PACEART REMOTE DEVICE CHECK: Date Time Interrogation Session: 20170605190834

## 2016-02-27 LAB — POCT INR: INR: 1.9

## 2016-03-06 LAB — CUP PACEART REMOTE DEVICE CHECK: Date Time Interrogation Session: 20170705194226

## 2016-03-15 ENCOUNTER — Other Ambulatory Visit: Payer: Self-pay | Admitting: Cardiology

## 2016-03-19 ENCOUNTER — Ambulatory Visit (INDEPENDENT_AMBULATORY_CARE_PROVIDER_SITE_OTHER): Payer: Medicare Other

## 2016-03-19 DIAGNOSIS — Z5181 Encounter for therapeutic drug level monitoring: Secondary | ICD-10-CM | POA: Diagnosis not present

## 2016-03-19 DIAGNOSIS — I4891 Unspecified atrial fibrillation: Secondary | ICD-10-CM | POA: Diagnosis not present

## 2016-03-19 LAB — POCT INR: INR: 3.1

## 2016-03-20 ENCOUNTER — Ambulatory Visit (INDEPENDENT_AMBULATORY_CARE_PROVIDER_SITE_OTHER): Payer: Medicare Other | Admitting: *Deleted

## 2016-03-20 DIAGNOSIS — I480 Paroxysmal atrial fibrillation: Secondary | ICD-10-CM

## 2016-03-23 NOTE — Progress Notes (Signed)
Carelink Summary Report / Loop Recorder 

## 2016-04-14 LAB — CUP PACEART REMOTE DEVICE CHECK: MDC IDC SESS DTM: 20170804200849

## 2016-04-14 NOTE — Progress Notes (Signed)
Carelink summary report received. Battery status OK. Normal device function. No new symptom episodes, tachy episodes, brady, or pause episodes. 15 AF 7.7% +Warfarin. Monthly summary reports and ROV/PRN

## 2016-04-16 ENCOUNTER — Ambulatory Visit (INDEPENDENT_AMBULATORY_CARE_PROVIDER_SITE_OTHER): Payer: Medicare Other | Admitting: *Deleted

## 2016-04-16 DIAGNOSIS — I4891 Unspecified atrial fibrillation: Secondary | ICD-10-CM

## 2016-04-16 DIAGNOSIS — Z5181 Encounter for therapeutic drug level monitoring: Secondary | ICD-10-CM

## 2016-04-16 LAB — POCT INR: INR: 2.6

## 2016-04-21 ENCOUNTER — Ambulatory Visit (INDEPENDENT_AMBULATORY_CARE_PROVIDER_SITE_OTHER): Payer: Medicare Other | Admitting: *Deleted

## 2016-04-21 DIAGNOSIS — I480 Paroxysmal atrial fibrillation: Secondary | ICD-10-CM | POA: Diagnosis not present

## 2016-04-22 NOTE — Progress Notes (Signed)
Carelink Summary Report / Loop Recorder 

## 2016-05-14 ENCOUNTER — Ambulatory Visit (INDEPENDENT_AMBULATORY_CARE_PROVIDER_SITE_OTHER): Payer: Medicare Other | Admitting: Pharmacist

## 2016-05-14 DIAGNOSIS — I4891 Unspecified atrial fibrillation: Secondary | ICD-10-CM

## 2016-05-14 DIAGNOSIS — Z5181 Encounter for therapeutic drug level monitoring: Secondary | ICD-10-CM | POA: Diagnosis not present

## 2016-05-14 LAB — POCT INR: INR: 2.6

## 2016-05-17 LAB — CUP PACEART REMOTE DEVICE CHECK: Date Time Interrogation Session: 20170903201040

## 2016-05-17 NOTE — Progress Notes (Signed)
Carelink summary report received. Battery status OK. Normal device function. No new symptom episodes, tachy episodes, brady, or pause episodes. 18 AF 4.3%, some ECGs appear AF, some appear SR PACs.+Warfarin. Monthly summary reports and ROV/PRN

## 2016-05-19 ENCOUNTER — Ambulatory Visit (INDEPENDENT_AMBULATORY_CARE_PROVIDER_SITE_OTHER): Payer: Medicare Other | Admitting: *Deleted

## 2016-05-19 DIAGNOSIS — I480 Paroxysmal atrial fibrillation: Secondary | ICD-10-CM | POA: Diagnosis not present

## 2016-05-20 NOTE — Progress Notes (Signed)
Carelink Summary Report / Loop Recorder 

## 2016-05-28 ENCOUNTER — Encounter: Payer: Self-pay | Admitting: Internal Medicine

## 2016-05-28 ENCOUNTER — Ambulatory Visit (INDEPENDENT_AMBULATORY_CARE_PROVIDER_SITE_OTHER): Payer: Medicare Other | Admitting: Internal Medicine

## 2016-05-28 VITALS — BP 170/84 | HR 62 | Ht 63.0 in | Wt 154.0 lb

## 2016-05-28 DIAGNOSIS — I480 Paroxysmal atrial fibrillation: Secondary | ICD-10-CM

## 2016-05-28 DIAGNOSIS — I10 Essential (primary) hypertension: Secondary | ICD-10-CM | POA: Diagnosis not present

## 2016-05-28 LAB — CUP PACEART INCLINIC DEVICE CHECK: Date Time Interrogation Session: 20171012125113

## 2016-05-28 MED ORDER — LOSARTAN POTASSIUM 100 MG PO TABS: 100.0000 mg | ORAL_TABLET | Freq: Every day | ORAL | 3 refills | Status: DC

## 2016-05-28 NOTE — Progress Notes (Signed)
Electrophysiology Office Note   Date:  05/28/2016   ID:  Monica [name], DOB [birth year], MRN [id number]  PCP:  Reginia Naas, MD  Cardiologist:  Dr Radford Pax Primary Electrophysiologist: Thompson Grayer, MD    Chief Complaint  Patient presents with  . Atrial Fibrillation     History of Present Illness: Monica [name] is a 75 y.o. female who presents today for electrophysiology evaluation.   She has rare episodes of afib, mostly only several minutes in duration.  She is tolerating anticoagulation without difficulty. Today, she denies symptoms of palpitations  shortness of breath, orthopnea, PND, lower extremity edema, claudication, dizziness, presyncope, syncope, bleeding, or neurologic sequela. The patient is tolerating medications without difficulties and is otherwise without complaint today.    Past Medical History:  Diagnosis Date  . Adrenal adenoma 6/11   lt on ct 6/11  . GERD (gastroesophageal reflux disease)   . Hypercholesteremia   . Hypertension    [facility]  . Kidney stone 6/11   rt. on ct 6/11  . Normal nuclear stress test 2008   low risk  . Osteoporosis   . Paroxysmal atrial fibrillation ([place])    a. PVI 10/13 b. PVI 12/15   Past Surgical History:  Procedure Laterality Date  . ATRIAL FIBRILLATION ABLATION N/A 06/07/2012   PVI and CTI ablation by Dr Rayann Heman  . ATRIAL FIBRILLATION ABLATION N/A 07/31/2014   PVI by Dr Rayann Heman; initial ablation 2013  . CARDIOVERSION  05/06/2012   Procedure: CARDIOVERSION;  Surgeon: Sueanne Margarita, MD;  Location: [facility];  Service: Cardiovascular;  Laterality: N/A;  h/p in file drawer  . CESAREAN SECTION  [phone]   x-2  . CHOLECYSTECTOMY  1989  . Implantable loop recorder implantation  05/30/14   Medtronic Reveal LINQ implanted by Dr Rayann Heman with RIO II protocol (ambulatory setting)  . TEE WITHOUT CARDIOVERSION  06/06/2012   Procedure: TRANSESOPHAGEAL ECHOCARDIOGRAM (TEE);  Surgeon: Jettie Booze, MD;  Location: [facility];  Service: Cardiovascular;  Laterality: N/A;  . TEE WITHOUT CARDIOVERSION N/A 07/30/2014   Procedure: TRANSESOPHAGEAL ECHOCARDIOGRAM (TEE);  Surgeon: Larey Dresser, MD;  Location: [facility] ENDOSCOPY;  Service: Cardiovascular;  Laterality: N/A;     Current Outpatient Prescriptions  Medication Sig Dispense Refill  . acetaminophen (TYLENOL) 500 MG tablet Take 500 mg by mouth every 6 (six) hours as needed. For pain    . calcium carbonate (OS-CAL) 600 MG TABS tablet Take 600 mg by mouth 2 (two) times daily with a meal.     . cholecalciferol (VITAMIN D) 1000 UNITS tablet Take 1,000 Units by mouth every evening.     Marland Kitchen losartan (COZAAR) 50 MG tablet Take 1.5 tablets (75 mg total) by mouth daily. 45 tablet 11  . metoprolol tartrate (LOPRESSOR) 25 MG tablet TAKE 1 & 1/2 TABLETS BY MOUTH TWICE A DAY 270 tablet 2  . pantoprazole (PROTONIX) 40 MG tablet Take 40 mg by mouth daily.    . pravastatin (PRAVACHOL) 20 MG tablet Take 20 mg by mouth daily.  1  . traZODone (DESYREL) 50 MG tablet Take 25-50 mg by mouth at bedtime as needed for sleep.     Marland Kitchen warfarin (COUMADIN) 5 MG tablet TAKE AS DIRECTED BY THE COUMADIN CLINIC 30 tablet 3   No current facility-administered medications for this visit.     Allergies:   Codeine   Social History:  The patient  reports that she has never smoked. She does not have any smokeless tobacco history on file. She  reports that she does not drink alcohol or use drugs.   Family History:  The patient's  family history includes Atrial fibrillation in her sister; CAD in her father; Heart attack in her brother and father; Hip fracture in her mother; Lung cancer in her brother; Other in her brother; Ovarian cancer in her sister; Thyroid cancer in her sister.    ROS:  Please see the history of present illness.   All other systems are reviewed and negative.    PHYSICAL EXAM: VS:  BP (!) 170/84   Pulse 62   Ht 5\' 3"  (1.6 m)   Wt 154 lb (69.9 kg)   BMI 27.28 kg/m  , BMI  Body mass index is 27.28 kg/m. GEN: Well nourished, well developed, in no acute distress  HEENT: normal  Neck: no JVD, carotid bruits, or masses Cardiac: RRR; no murmurs, rubs, or gallops,no edema  Respiratory:  clear to auscultation bilaterally, normal work of breathing GI: soft, nontender, nondistended, + BS MS: no deformity or atrophy  Skin: warm and dry  Neuro:  Strength and sensation are intact Psych: euthymic mood, full affect    ILR interrogation is reviewed (see paceart)   Recent Labs: No results found for requested labs within last 8760 hours.    Lipid Panel  No results found for: CHOL, TRIG, HDL, CHOLHDL, VLDL, LDLCALC, LDLDIRECT   Wt Readings from Last 3 Encounters:  05/28/16 154 lb (69.9 kg)  02/19/16 151 lb (68.5 kg)  08/13/15 151 lb 3.2 oz (68.6 kg)      ASSESSMENT AND PLAN:  1.  afib Doing well s/p recent ablation (initial ablation 2013, more recently ablated 12/15) She is maintaining sinus rhythm off of AAD therapy with afib burden only 3.%.  Review of strips reveal inappropriate afib detection for sinus with PACs.  I have therefore adjusted the device sensitivity today.  I suspect AF burden is really <3%. chads2vasc score is at least 3 Continue long term anticoagulation  2. HTN Elevated Increase losartan to 100mg  daily Follow-up with Dr Tamala Julian for BMET   Current medicines are reviewed at length with the patient today.   The patient does not have concerns regarding her medicines.  The following changes were made today:  none  Follow-up with Roderic Palau NP in 6 months and I will see in a year Follow-up with Dr Radford Pax as scheduled  Signed, Thompson Grayer, MD  05/28/2016 11:05 AM     [facility] [address] [place] [address] [phone] (office) [phone] (fax)

## 2016-05-28 NOTE — Addendum Note (Signed)
Addended by: Janan Halter F on: 05/28/2016 11:17 AM   Modules accepted: Orders

## 2016-05-28 NOTE — Patient Instructions (Addendum)
Medication Instructions:  Your physician has recommended you make the following change in your medication:  1) Increase Losartan to 100 mg daily   Labwork: None ordered   Testing/Procedures: None ordered   Follow-Up     Your physician wants you to follow-up in: 6 months with Roderic Palau, NP and 12 months with Dr Vallery Ridge will receive a reminder letter in the mail two months in advance. If you don't receive a letter, please call our office to schedule the follow-up appointment.   Any Other Special Instructions Will Be Listed Below (If Applicable).     If you need a refill on your cardiac medications before your next appointment, please call your pharmacy.

## 2016-06-15 ENCOUNTER — Ambulatory Visit (INDEPENDENT_AMBULATORY_CARE_PROVIDER_SITE_OTHER): Payer: Medicare Other | Admitting: *Deleted

## 2016-06-15 DIAGNOSIS — Z5181 Encounter for therapeutic drug level monitoring: Secondary | ICD-10-CM | POA: Diagnosis not present

## 2016-06-15 DIAGNOSIS — I4891 Unspecified atrial fibrillation: Secondary | ICD-10-CM | POA: Diagnosis not present

## 2016-06-15 LAB — POCT INR: INR: 2.2

## 2016-06-18 ENCOUNTER — Ambulatory Visit (INDEPENDENT_AMBULATORY_CARE_PROVIDER_SITE_OTHER): Payer: Medicare Other | Admitting: *Deleted

## 2016-06-18 DIAGNOSIS — I480 Paroxysmal atrial fibrillation: Secondary | ICD-10-CM

## 2016-06-19 NOTE — Progress Notes (Signed)
Carelink Summary Report / Loop Recorder 

## 2016-06-27 LAB — CUP PACEART REMOTE DEVICE CHECK
Implantable Pulse Generator Implant Date: 20151014
MDC IDC SESS DTM: 20171003211012

## 2016-06-27 NOTE — Progress Notes (Signed)
Carelink summary report received. Battery status OK. Normal device function. No new symptom episodes, tachy episodes, brady, or pause episodes. 28 AF 3.5% +warfarin. Monthly summary reports and ROV/PRN

## 2016-07-20 ENCOUNTER — Ambulatory Visit (INDEPENDENT_AMBULATORY_CARE_PROVIDER_SITE_OTHER): Payer: Medicare Other | Admitting: *Deleted

## 2016-07-20 DIAGNOSIS — I480 Paroxysmal atrial fibrillation: Secondary | ICD-10-CM

## 2016-07-20 NOTE — Progress Notes (Signed)
Carelink Summary Report / Loop Recorder 

## 2016-07-24 ENCOUNTER — Ambulatory Visit (INDEPENDENT_AMBULATORY_CARE_PROVIDER_SITE_OTHER): Payer: Medicare Other | Admitting: *Deleted

## 2016-07-24 DIAGNOSIS — I4891 Unspecified atrial fibrillation: Secondary | ICD-10-CM

## 2016-07-24 DIAGNOSIS — Z5181 Encounter for therapeutic drug level monitoring: Secondary | ICD-10-CM | POA: Diagnosis not present

## 2016-07-24 LAB — POCT INR: INR: 2.5

## 2016-07-25 LAB — CUP PACEART REMOTE DEVICE CHECK
Date Time Interrogation Session: 20171102213922
MDC IDC PG IMPLANT DT: 20151014

## 2016-07-25 NOTE — Progress Notes (Signed)
Carelink summary report received. Battery status OK. Normal device function. No new symptom episodes, tachy episodes, brady, or pause episodes. 21 AF 3.5% +warfarin. Some ECGs appear true AF, some appear SR w/ PACs. Monthly summary reports and ROV/PRN

## 2016-08-18 ENCOUNTER — Ambulatory Visit (INDEPENDENT_AMBULATORY_CARE_PROVIDER_SITE_OTHER): Payer: Medicare Other | Admitting: *Deleted

## 2016-08-18 DIAGNOSIS — I480 Paroxysmal atrial fibrillation: Secondary | ICD-10-CM | POA: Diagnosis not present

## 2016-08-19 NOTE — Progress Notes (Signed)
Carelink Summary Report 

## 2016-09-04 ENCOUNTER — Ambulatory Visit (INDEPENDENT_AMBULATORY_CARE_PROVIDER_SITE_OTHER): Payer: Medicare Other | Admitting: Pharmacist

## 2016-09-04 DIAGNOSIS — I4891 Unspecified atrial fibrillation: Secondary | ICD-10-CM | POA: Diagnosis not present

## 2016-09-04 DIAGNOSIS — Z5181 Encounter for therapeutic drug level monitoring: Secondary | ICD-10-CM

## 2016-09-04 LAB — POCT INR: INR: 2.7

## 2016-09-05 LAB — CUP PACEART REMOTE DEVICE CHECK
Implantable Pulse Generator Implant Date: 20151014
MDC IDC SESS DTM: 20171202224810

## 2016-09-05 NOTE — Progress Notes (Signed)
Carelink summary report received. Battery status OK. Normal device function. No new symptom episodes, tachy episodes, brady, or pause episodes. 59 AF 2.6%, some ECGs appear AF, others SR w/ PAC's. +coumadin. Monthly summary reports and ROV/PRN

## 2016-09-07 ENCOUNTER — Encounter (HOSPITAL_COMMUNITY): Payer: Self-pay | Admitting: Nurse Practitioner

## 2016-09-07 ENCOUNTER — Ambulatory Visit (HOSPITAL_COMMUNITY)
Admission: RE | Admit: 2016-09-07 | Discharge: 2016-09-07 | Disposition: A | Discharge status: Home / Self Care | Payer: Medicare Other | Source: Ambulatory Visit | Attending: Nurse Practitioner | Admitting: Nurse Practitioner

## 2016-09-07 VITALS — BP 164/96 | HR 117 | Ht 63.0 in | Wt 154.0 lb

## 2016-09-07 DIAGNOSIS — I469 Cardiac arrest, cause unspecified: Secondary | ICD-10-CM

## 2016-09-07 DIAGNOSIS — I4819 Other persistent atrial fibrillation: Secondary | ICD-10-CM

## 2016-09-07 DIAGNOSIS — Z5189 Encounter for other specified aftercare: Secondary | ICD-10-CM | POA: Insufficient documentation

## 2016-09-07 DIAGNOSIS — I481 Persistent atrial fibrillation: Secondary | ICD-10-CM

## 2016-09-07 DIAGNOSIS — Z79899 Other long term (current) drug therapy: Secondary | ICD-10-CM | POA: Insufficient documentation

## 2016-09-07 DIAGNOSIS — Z9889 Other specified postprocedural states: Secondary | ICD-10-CM | POA: Insufficient documentation

## 2016-09-07 DIAGNOSIS — I10 Essential (primary) hypertension: Secondary | ICD-10-CM

## 2016-09-07 DIAGNOSIS — Z9049 Acquired absence of other specified parts of digestive tract: Secondary | ICD-10-CM | POA: Insufficient documentation

## 2016-09-07 LAB — BASIC METABOLIC PANEL
ANION GAP: 5 (ref 5–15)
BUN: 17 mg/dL (ref 6–20)
CALCIUM: 9.7 mg/dL (ref 8.9–10.3)
CO2: 29 mmol/L (ref 22–32)
Chloride: 103 mmol/L (ref 101–111)
Creatinine, Ser: 1.08 mg/dL — ABNORMAL HIGH (ref 0.44–1.00)
GFR calc Af Amer: 56 mL/min — ABNORMAL LOW (ref >60)
GFR calc non Af Amer: 49 mL/min — ABNORMAL LOW (ref >60)
GLUCOSE: 131 mg/dL — AB (ref 65–99)
POTASSIUM: 4 mmol/L (ref 3.5–5.1)
SODIUM: 137 mmol/L (ref 135–145)

## 2016-09-07 LAB — CBC
HCT: 41 % (ref 36.0–46.0)
Hemoglobin: 13 g/dL (ref 12.0–15.0)
MCH: 26.3 pg (ref 26.0–34.0)
MCHC: 31.7 g/dL (ref 30.0–36.0)
MCV: 83 fL (ref 78.0–100.0)
Platelets: 335 10*3/uL (ref 150–400)
RBC: 4.94 MIL/uL (ref 3.87–5.11)
RDW: 15.4 % (ref 11.5–15.5)
WBC: 7.6 10*3/uL (ref 4.0–10.5)

## 2016-09-07 LAB — PROTIME-INR
INR: 2.22
Prothrombin Time: 25 seconds — ABNORMAL HIGH (ref 11.4–15.2)

## 2016-09-07 MED ORDER — AMLODIPINE BESYLATE 5 MG PO TABS: 5.0000 mg | ORAL_TABLET | Freq: Every day | ORAL | 3 refills | Status: DC

## 2016-09-07 NOTE — Progress Notes (Signed)
Primary Care Physician: Reginia Naas, MD Primary Cardiologist: Radford Pax Primary Electrophysiologist: Kittie Plater is a 76 y.o. female with a history of persistent atrial fibrillation who presents for follow up in the [facility].  She called the office with reports of atrial fibrillation since last Wednesday and was added on to be seen today. She states that with her atrial fibrillation, she has been fatigued, had exercise intolerance, had some shortness of breath, and atypical chest pain.  She also reports a period of pre-syncope associated with nausea and diaphoresis on Wednesday of last week that caused her to "flop down on the bed because I thought I was going to die".       Today, she denies symptoms of palpitations, chest pain, shortness of breath, orthopnea, PND, lower extremity edema, dizziness, presyncope, syncope, snoring, daytime somnolence, bleeding, or neurologic sequela. The patient is tolerating medications without difficulties and is otherwise without complaint today.    Atrial Fibrillation Risk Factors:  she does not have symptoms or diagnosis of sleep apnea.  she does not have a history of rheumatic fever.  she does not have a history of alcohol use.  she has a BMI of Body mass index is 27.28 kg/m.Marland Kitchen Filed Weights   09/07/16 1417  Weight: 154 lb (69.9 kg)    LA size: 34   Atrial Fibrillation Management history:  Previous antiarrhythmic drugs: Flecainide and Rhythmol - did not have good success with either one  Previous cardioversions: none  Previous ablations: 05/2012; 07/2014  CHADS2VASC score: 4  Anticoagulation history: Warfarin   Past Medical History:  Diagnosis Date  . Adrenal adenoma 6/11   lt on ct 6/11  . GERD (gastroesophageal reflux disease)   . Hypercholesteremia   . Hypertension    [facility]  . Kidney stone 6/11   rt. on ct 6/11  . Normal nuclear stress test 2008   low risk  . Osteoporosis    . Paroxysmal atrial fibrillation ([place])    a. PVI 10/13 b. PVI 12/15   Past Surgical History:  Procedure Laterality Date  . ATRIAL FIBRILLATION ABLATION N/A 06/07/2012   PVI and CTI ablation by Dr Rayann Heman  . ATRIAL FIBRILLATION ABLATION N/A 07/31/2014   PVI by Dr Rayann Heman; initial ablation 2013  . CARDIOVERSION  05/06/2012   Procedure: CARDIOVERSION;  Surgeon: Sueanne Margarita, MD;  Location: [facility];  Service: Cardiovascular;  Laterality: N/A;  h/p in file drawer  . CESAREAN SECTION  [phone]   x-2  . CHOLECYSTECTOMY  1989  . Implantable loop recorder implantation  05/30/14   Medtronic Reveal LINQ implanted by Dr Rayann Heman with RIO II protocol (ambulatory setting)  . TEE WITHOUT CARDIOVERSION  06/06/2012   Procedure: TRANSESOPHAGEAL ECHOCARDIOGRAM (TEE);  Surgeon: Jettie Booze, MD;  Location: [facility];  Service: Cardiovascular;  Laterality: N/A;  . TEE WITHOUT CARDIOVERSION N/A 07/30/2014   Procedure: TRANSESOPHAGEAL ECHOCARDIOGRAM (TEE);  Surgeon: Larey Dresser, MD;  Location: [facility] ENDOSCOPY;  Service: Cardiovascular;  Laterality: N/A;    Current Outpatient Prescriptions  Medication Sig Dispense Refill  . acetaminophen (TYLENOL) 500 MG tablet Take 500 mg by mouth every 6 (six) hours as needed. For pain    . calcium carbonate (OS-CAL) 600 MG TABS tablet Take 600 mg by mouth 2 (two) times daily with a meal.     . cholecalciferol (VITAMIN D) 1000 UNITS tablet Take 1,000 Units by mouth every evening.     Marland Kitchen losartan (COZAAR) 100 MG  tablet Take 1 tablet (100 mg total) by mouth daily. 90 tablet 3  . metoprolol tartrate (LOPRESSOR) 25 MG tablet TAKE 1 & 1/2 TABLETS BY MOUTH TWICE A DAY 270 tablet 2  . pantoprazole (PROTONIX) 40 MG tablet Take 40 mg by mouth daily.    . pravastatin (PRAVACHOL) 20 MG tablet Take 20 mg by mouth daily.  1  . traZODone (DESYREL) 50 MG tablet Take 25-50 mg by mouth at bedtime as needed for sleep.     Marland Kitchen warfarin (COUMADIN) 5 MG tablet TAKE AS DIRECTED  BY THE COUMADIN CLINIC 30 tablet 3  . amLODipine (NORVASC) 5 MG tablet Take 1 tablet (5 mg total) by mouth daily. 30 tablet 3   No current facility-administered medications for this encounter.     Allergies  Allergen Reactions  . Codeine Nausea And Vomiting    Social History   Social History  . Marital status: Widowed    Spouse name: N/A  . Number of children: N/A  . Years of education: N/A   Occupational History  . Not on file.   Social History Main Topics  . Smoking status: Never Smoker  . Smokeless tobacco: Never Used  . Alcohol use No  . Drug use: No  . Sexual activity: Not on file   Other Topics Concern  . 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   [place]   Marital Status:single,widowed,2006.    Family History  Problem Relation Age of Onset  . Heart attack Father   . CAD Father   . Hip fracture Mother     complication  . Lung cancer Brother   . Heart attack Brother     massive  . Other Brother     mva  . Thyroid cancer Sister   . Atrial fibrillation Sister   . Ovarian cancer Sister     surviver   The patient does not have a history of early familial atrial fibrillation or other arrhythmias.  ROS- All systems are reviewed and negative except as per the HPI above.  Physical Exam: Vitals:   09/07/16 1417  BP: (!) 164/96  Pulse: (!) 117  Weight: 154 lb (69.9 kg)  Height: 5\' 3"  (1.6 m)    GEN- The patient is elderly appearing, alert and oriented x 3 today.   Head- normocephalic, atraumatic Eyes-  Sclera clear, conjunctiva pink Ears- hearing intact Oropharynx- clear Neck- supple  Lungs- Clear to ausculation bilaterally, normal work of breathing Heart- Tachycardic irregular rate and rhythm  GI- soft, NT, ND, + BS Extremities- no clubbing, cyanosis, +BLE dependent edema  MS- no significant deformity or atrophy Skin- no rash or lesion Psych- euthymic mood, full  affect Neuro- strength and sensation are intact  Wt Readings from Last 3 Encounters:  09/07/16 154 lb (69.9 kg)  05/28/16 154 lb (69.9 kg)  02/19/16 151 lb (68.5 kg)    EKG today demonstrates atrial fibrillation with likely pulmonary vein trigger, rate 117 Echo 07/2014 demonstrated EF 60-65%, no RWMA  Epic records are reviewed at length today  Assessment and Plan:  1. Persistent atrial fibrillation The patient presents with recurrent persistent atrial fibrillation.  She has previously not had success with Flecainide or Rhythmol.  She has done very well with low burden of AF since last ablation aside from this one episode.  I think that attempt at cardioversion without adding AAD therapy is reasonable first step.  Other medical therapy will  be limited by bradycardia, see below.  Will plan DCCV tomorrow at time of PPM implant Continue Warfarin long term for CHADS2VASC of 4  2. Asystole The patient had a period of asystole with underlying atrial fibrillation last Wednesday at the time of her pre-syncopal spell.  I suspect she actually did pass out as opposed to "fall asleep".   Her episode was preceded by prolongation of R-R intervals, but lasted for >15 seconds.  Discussed with Dr Rayann Heman today. Because of need for rate controlling medications long term, I have recommended pacemaker implant for tachy/brady syndrome.  She also has heart rate recordings at home in the 40's in SR although histograms on ILR for the most part are reasonable.  Risks, benefits to pacemaker implantation discussed with the patient today who wishes to proceed. I have also discussed with her son Corene Cornea by phone who lives in [place].   No driving until after pacemaker placed - her other son, Fara Olden, will pick her up from clinic today.  Plan PPM tomorrow with Dr Rayann Heman with ILR removal at same time.   3. HTN Blood pressure elevated today With asystole on ILR, will add Amlodipine 5mg  daily for now  I have offered  admission today, but the patient would prefer to go home tonight and come back tomorrow for procedure.  ER precautions given. Follow up with me at [facility] after PPM implant.   Chanetta Marshall, NP 09/07/2016 4:56 PM

## 2016-09-07 NOTE — Patient Instructions (Signed)
Pacemaker scheduled for Tuesday, January 23rd  - Arrive at the [facility] and go to admitting at [facility] not eat or drink anything after midnight the night prior to your procedure.  - Take all your medication with a sip of water prior to arrival.  - Be prepared to stay overnight.  Your physician has recommended you make the following change in your medication:  1)Norvasc 5mg  once a day

## 2016-09-08 ENCOUNTER — Encounter (HOSPITAL_COMMUNITY): Payer: Self-pay | Admitting: *Deleted

## 2016-09-08 ENCOUNTER — Encounter (HOSPITAL_COMMUNITY)
Admission: RE | Disposition: A | Discharge status: Home / Self Care | Payer: Self-pay | Source: Ambulatory Visit | Attending: Internal Medicine

## 2016-09-08 ENCOUNTER — Ambulatory Visit (HOSPITAL_COMMUNITY)
Admission: RE | Admit: 2016-09-08 | Discharge: 2016-09-09 | Disposition: A | Discharge status: Home / Self Care | Payer: Medicare Other | Source: Ambulatory Visit | Attending: Internal Medicine | Admitting: Internal Medicine

## 2016-09-08 DIAGNOSIS — Z87442 Personal history of urinary calculi: Secondary | ICD-10-CM | POA: Diagnosis not present

## 2016-09-08 DIAGNOSIS — I10 Essential (primary) hypertension: Secondary | ICD-10-CM | POA: Diagnosis not present

## 2016-09-08 DIAGNOSIS — K219 Gastro-esophageal reflux disease without esophagitis: Secondary | ICD-10-CM | POA: Insufficient documentation

## 2016-09-08 DIAGNOSIS — Z7901 Long term (current) use of anticoagulants: Secondary | ICD-10-CM | POA: Insufficient documentation

## 2016-09-08 DIAGNOSIS — M810 Age-related osteoporosis without current pathological fracture: Secondary | ICD-10-CM | POA: Diagnosis not present

## 2016-09-08 DIAGNOSIS — I481 Persistent atrial fibrillation: Secondary | ICD-10-CM | POA: Diagnosis not present

## 2016-09-08 DIAGNOSIS — Z8041 Family history of malignant neoplasm of ovary: Secondary | ICD-10-CM | POA: Insufficient documentation

## 2016-09-08 DIAGNOSIS — E785 Hyperlipidemia, unspecified: Secondary | ICD-10-CM | POA: Insufficient documentation

## 2016-09-08 DIAGNOSIS — Z800 Family history of malignant neoplasm of digestive organs: Secondary | ICD-10-CM | POA: Diagnosis not present

## 2016-09-08 DIAGNOSIS — Z959 Presence of cardiac and vascular implant and graft, unspecified: Secondary | ICD-10-CM

## 2016-09-08 DIAGNOSIS — Z808 Family history of malignant neoplasm of other organs or systems: Secondary | ICD-10-CM | POA: Diagnosis not present

## 2016-09-08 DIAGNOSIS — E7800 Pure hypercholesterolemia, unspecified: Secondary | ICD-10-CM | POA: Insufficient documentation

## 2016-09-08 DIAGNOSIS — I442 Atrioventricular block, complete: Secondary | ICD-10-CM | POA: Insufficient documentation

## 2016-09-08 DIAGNOSIS — Z8249 Family history of ischemic heart disease and other diseases of the circulatory system: Secondary | ICD-10-CM | POA: Diagnosis not present

## 2016-09-08 DIAGNOSIS — I4891 Unspecified atrial fibrillation: Secondary | ICD-10-CM | POA: Diagnosis present

## 2016-09-08 HISTORY — PX: ELECTROPHYSIOLOGIC STUDY: SHX172A

## 2016-09-08 HISTORY — PX: EP IMPLANTABLE DEVICE: SHX172B

## 2016-09-08 LAB — SURGICAL PCR SCREEN
MRSA, PCR: NEGATIVE
STAPHYLOCOCCUS AUREUS: NEGATIVE

## 2016-09-08 LAB — PROTIME-INR
INR: 2.39
Prothrombin Time: 26.5 seconds — ABNORMAL HIGH (ref 11.4–15.2)

## 2016-09-08 SURGERY — LOOP RECORDER REMOVAL

## 2016-09-08 MED ORDER — CHLORHEXIDINE GLUCONATE 4 % EX LIQD
60.0000 mL | Freq: Once | CUTANEOUS | Status: DC
  Filled 2016-09-08: qty 60

## 2016-09-08 MED ORDER — LIDOCAINE HCL (PF) 1 % IJ SOLN
INTRAMUSCULAR | Status: DC | PRN
  Administered 2016-09-08: 55 mL via INTRADERMAL
  Administered 2016-09-08: 15 mL via INTRADERMAL

## 2016-09-08 MED ORDER — CEFAZOLIN IN D5W 1 GM/50ML IV SOLN
1.0000 g | Freq: Four times a day (QID) | INTRAVENOUS | Status: AC
  Administered 2016-09-08 – 2016-09-09 (×3): 1 g via INTRAVENOUS
  Filled 2016-09-08 (×3): qty 50

## 2016-09-08 MED ORDER — SODIUM CHLORIDE 0.9 % IV SOLN
INTRAVENOUS | Status: DC
  Administered 2016-09-08: 10:00:00 via INTRAVENOUS

## 2016-09-08 MED ORDER — LIDOCAINE HCL (PF) 1 % IJ SOLN
INTRAMUSCULAR | Status: AC
  Filled 2016-09-08: qty 60

## 2016-09-08 MED ORDER — SODIUM CHLORIDE 0.9 % IR SOLN
Status: AC
  Filled 2016-09-08: qty 2

## 2016-09-08 MED ORDER — LIDOCAINE HCL (PF) 1 % IJ SOLN
INTRAMUSCULAR | Status: AC
  Filled 2016-09-08: qty 30

## 2016-09-08 MED ORDER — SODIUM CHLORIDE 0.9 % IV SOLN: 250.0000 mL | INTRAVENOUS | Status: DC | PRN

## 2016-09-08 MED ORDER — WARFARIN SODIUM 2.5 MG PO TABS
2.5000 mg | ORAL_TABLET | Freq: Once | ORAL | Status: AC
  Administered 2016-09-08: 2.5 mg via ORAL
  Filled 2016-09-08: qty 1

## 2016-09-08 MED ORDER — SODIUM CHLORIDE 0.9% FLUSH: 3.0000 mL | INTRAVENOUS | Status: DC | PRN

## 2016-09-08 MED ORDER — FENTANYL CITRATE (PF) 100 MCG/2ML IJ SOLN
INTRAMUSCULAR | Status: AC
  Filled 2016-09-08: qty 2

## 2016-09-08 MED ORDER — TRAZODONE 25 MG HALF TABLET
25.0000 mg | ORAL_TABLET | Freq: Every evening | ORAL | Status: DC | PRN
  Administered 2016-09-08: 50 mg via ORAL
  Filled 2016-09-08 (×3): qty 2

## 2016-09-08 MED ORDER — SODIUM CHLORIDE 0.9% FLUSH
3.0000 mL | Freq: Two times a day (BID) | INTRAVENOUS | Status: DC
  Administered 2016-09-08 – 2016-09-09 (×2): 3 mL via INTRAVENOUS

## 2016-09-08 MED ORDER — WARFARIN - PHYSICIAN DOSING INPATIENT
Freq: Every day | Status: DC
  Administered 2016-09-08: 18:00:00

## 2016-09-08 MED ORDER — MIDAZOLAM HCL 5 MG/5ML IJ SOLN
INTRAMUSCULAR | Status: AC
  Filled 2016-09-08: qty 5

## 2016-09-08 MED ORDER — CEFAZOLIN SODIUM-DEXTROSE 2-4 GM/100ML-% IV SOLN
2.0000 g | INTRAVENOUS | Status: AC
  Administered 2016-09-08: 2 g via INTRAVENOUS

## 2016-09-08 MED ORDER — ACETAMINOPHEN 325 MG PO TABS
325.0000 mg | ORAL_TABLET | ORAL | Status: DC | PRN
  Administered 2016-09-08: 650 mg via ORAL

## 2016-09-08 MED ORDER — FENTANYL CITRATE (PF) 100 MCG/2ML IJ SOLN
INTRAMUSCULAR | Status: DC | PRN
  Administered 2016-09-08 (×2): 12.5 ug via INTRAVENOUS

## 2016-09-08 MED ORDER — SOTALOL HCL 80 MG PO TABS
80.0000 mg | ORAL_TABLET | Freq: Two times a day (BID) | ORAL | Status: DC
  Administered 2016-09-08 – 2016-09-09 (×2): 80 mg via ORAL
  Filled 2016-09-08 (×3): qty 1

## 2016-09-08 MED ORDER — ONDANSETRON HCL 4 MG/2ML IJ SOLN: 4.0000 mg | Freq: Four times a day (QID) | INTRAMUSCULAR | Status: DC | PRN

## 2016-09-08 MED ORDER — PANTOPRAZOLE SODIUM 40 MG PO TBEC
40.0000 mg | DELAYED_RELEASE_TABLET | Freq: Every day | ORAL | Status: DC
  Administered 2016-09-08 – 2016-09-09 (×2): 40 mg via ORAL
  Filled 2016-09-08 (×2): qty 1

## 2016-09-08 MED ORDER — CEFAZOLIN SODIUM-DEXTROSE 2-4 GM/100ML-% IV SOLN
INTRAVENOUS | Status: AC
  Filled 2016-09-08: qty 100

## 2016-09-08 MED ORDER — HEPARIN (PORCINE) IN NACL 2-0.9 UNIT/ML-% IJ SOLN
INTRAMUSCULAR | Status: AC
  Filled 2016-09-08: qty 500

## 2016-09-08 MED ORDER — HYDROCODONE-ACETAMINOPHEN 5-325 MG PO TABS: 1.0000 | ORAL_TABLET | ORAL | Status: DC | PRN

## 2016-09-08 MED ORDER — SODIUM CHLORIDE 0.9 % IR SOLN
80.0000 mg | Status: AC
  Administered 2016-09-08: 80 mg

## 2016-09-08 MED ORDER — ACETAMINOPHEN 325 MG PO TABS
ORAL_TABLET | ORAL | Status: AC
  Filled 2016-09-08: qty 2

## 2016-09-08 MED ORDER — LOSARTAN POTASSIUM 50 MG PO TABS
100.0000 mg | ORAL_TABLET | Freq: Every day | ORAL | Status: DC
  Administered 2016-09-08 – 2016-09-09 (×2): 100 mg via ORAL
  Filled 2016-09-08 (×2): qty 2

## 2016-09-08 MED ORDER — HEPARIN (PORCINE) IN NACL 2-0.9 UNIT/ML-% IJ SOLN
INTRAMUSCULAR | Status: DC | PRN
  Administered 2016-09-08: 500 mL

## 2016-09-08 MED ORDER — MIDAZOLAM HCL 5 MG/5ML IJ SOLN
INTRAMUSCULAR | Status: DC | PRN
  Administered 2016-09-08: 1 mg via INTRAVENOUS
  Administered 2016-09-08: 2 mg via INTRAVENOUS
  Administered 2016-09-08 (×3): 1 mg via INTRAVENOUS

## 2016-09-08 MED ORDER — IOPAMIDOL (ISOVUE-370) INJECTION 76%
INTRAVENOUS | Status: DC | PRN
  Administered 2016-09-08: 15 mL via INTRAVENOUS

## 2016-09-08 MED ORDER — MUPIROCIN 2 % EX OINT
TOPICAL_OINTMENT | CUTANEOUS | Status: AC
  Administered 2016-09-08: 10:00:00
  Filled 2016-09-08: qty 22

## 2016-09-08 SURGICAL SUPPLY — 7 items
CABLE SURGICAL S-101-97-12 (CABLE) ×3 IMPLANT
LEAD CAPSURE NOVUS 45CM (Lead) ×3 IMPLANT
LEAD CAPSURE NOVUS 5076-58CM (Lead) ×3 IMPLANT
PAD DEFIB LIFELINK (PAD) ×6 IMPLANT
PPM ADVISA MRI DR A2DR01 (Pacemaker) ×3 IMPLANT
SHEATH CLASSIC 7F (SHEATH) ×6 IMPLANT
TRAY PACEMAKER INSERTION (PACKS) ×3 IMPLANT

## 2016-09-08 NOTE — Interval H&P Note (Signed)
History and Physical Interval Note:  09/08/2016 10:22 AM  Dot Lanes  has presented today for surgery, with the diagnosis of bradicardia  The various methods of treatment have been discussed with the patient and family. After consideration of risks, benefits and other options for treatment, the patient has consented to  Procedure(s): Loop Recorder Removal (N/A) Pacemaker Implant (N/A) Cardioversion (N/A) as a surgical intervention .  The patient's history has been reviewed, patient examined, no change in status, stable for surgery.  I have reviewed the patient's chart and labs.  Questions were answered to the patient's satisfaction.    Pt with symptomatic period of asystole with associated syncope.  She also has recurrent symptomatic persistent afib.  No reversible causes are noted.  I would therefore recommend pacemaker implantation at this time.  Risks, benefits, alternatives to pacemaker implantation with cardioversion and implantable loop recorder removal were discussed in detail with the patient today. The patient understands that the risks include but are not limited to bleeding, infection, pneumothorax, perforation, tamponade, vascular damage, renal failure, MI, stroke, death,  and lead dislodgement and wishes to proceed at this time.   Thompson Grayer

## 2016-09-08 NOTE — Progress Notes (Signed)
Pt received from cath lab. Pt and son oriented to room and equipment. VSS. Telemetry notified. Call bell within reach, will continue to monitor.   Fritz Pickerel, RN

## 2016-09-08 NOTE — H&P (View-Only) (Signed)
Primary Care Physician: Reginia Naas, MD Primary Cardiologist: Radford Pax Primary Electrophysiologist: Kittie Plater is a 76 y.o. female with a history of persistent atrial fibrillation who presents for follow up in the [facility].  She called the office with reports of atrial fibrillation since last Wednesday and was added on to be seen today. She states that with her atrial fibrillation, she has been fatigued, had exercise intolerance, had some shortness of breath, and atypical chest pain.  She also reports a period of pre-syncope associated with nausea and diaphoresis on Wednesday of last week that caused her to "flop down on the bed because I thought I was going to die".       Today, she denies symptoms of palpitations, chest pain, shortness of breath, orthopnea, PND, lower extremity edema, dizziness, presyncope, syncope, snoring, daytime somnolence, bleeding, or neurologic sequela. The patient is tolerating medications without difficulties and is otherwise without complaint today.    Atrial Fibrillation Risk Factors:  she does not have symptoms or diagnosis of sleep apnea.  she does not have a history of rheumatic fever.  she does not have a history of alcohol use.  she has a BMI of Body mass index is 27.28 kg/m.Marland Kitchen Filed Weights   09/07/16 1417  Weight: 154 lb (69.9 kg)    LA size: 34   Atrial Fibrillation Management history:  Previous antiarrhythmic drugs: Flecainide and Rhythmol - did not have good success with either one  Previous cardioversions: none  Previous ablations: 05/2012; 07/2014  CHADS2VASC score: 4  Anticoagulation history: Warfarin   Past Medical History:  Diagnosis Date  . Adrenal adenoma 6/11   lt on ct 6/11  . GERD (gastroesophageal reflux disease)   . Hypercholesteremia   . Hypertension    [facility]  . Kidney stone 6/11   rt. on ct 6/11  . Normal nuclear stress test 2008   low risk  . Osteoporosis    . Paroxysmal atrial fibrillation ([place])    a. PVI 10/13 b. PVI 12/15   Past Surgical History:  Procedure Laterality Date  . ATRIAL FIBRILLATION ABLATION N/A 06/07/2012   PVI and CTI ablation by Dr Rayann Heman  . ATRIAL FIBRILLATION ABLATION N/A 07/31/2014   PVI by Dr Rayann Heman; initial ablation 2013  . CARDIOVERSION  05/06/2012   Procedure: CARDIOVERSION;  Surgeon: Sueanne Margarita, MD;  [facility];  Service: Cardiovascular;  Laterality: N/A;  h/p in file drawer  . CESAREAN SECTION  [phone]   x-2  . CHOLECYSTECTOMY  1989  . Implantable loop recorder implantation  05/30/14   [employer]onic Reveal LINQ implanted by Dr Rayann Heman with RIO II protocol (ambulatory setting)  . TEE WITHOUT CARDIOVERSION  06/06/2012   Procedure: TRANSESOPHAGEAL ECHOCARDIOGRAM (TEE);  Surgeon: Jettie Booze, MD;  Location: [place];  Service: Cardiovascular;  Laterality: N/A;  . TEE WITHOUT CARDIOVERSION N/A 07/30/2014   Procedure: TRANSESOPHAGEAL ECHOCARDIOGRAM (TEE);  Surgeon: Larey Dresser, MD;  Location: [facility] ENDOSCOPY;  Service: Cardiovascular;  Laterality: N/A;    Current Outpatient Prescriptions  Medication Sig Dispense Refill  . acetaminophen (TYLENOL) 500 MG tablet Take 500 mg by mouth every 6 (six) hours as needed. For pain    . calcium carbonate (OS-CAL) 600 MG TABS tablet Take 600 mg by mouth 2 (two) times daily with a meal.     . cholecalciferol (VITAMIN D) 1000 UNITS tablet Take 1,000 Units by mouth every evening.     Marland Kitchen losartan (COZAAR) 100 MG  tablet Take 1 tablet (100 mg total) by mouth daily. 90 tablet 3  . metoprolol tartrate (LOPRESSOR) 25 MG tablet TAKE 1 & 1/2 TABLETS BY MOUTH TWICE A DAY 270 tablet 2  . pantoprazole (PROTONIX) 40 MG tablet Take 40 mg by mouth daily.    . pravastatin (PRAVACHOL) 20 MG tablet Take 20 mg by mouth daily.  1  . traZODone (DESYREL) 50 MG tablet Take 25-50 mg by mouth at bedtime as needed for sleep.     Marland Kitchen warfarin (COUMADIN) 5 MG tablet TAKE AS DIRECTED  BY THE COUMADIN CLINIC 30 tablet 3  . amLODipine (NORVASC) 5 MG tablet Take 1 tablet (5 mg total) by mouth daily. 30 tablet 3   No current facility-administered medications for this encounter.     Allergies  Allergen Reactions  . Codeine Nausea And Vomiting    Social History   Social History  . Marital status: Widowed    Spouse name: N/A  . Number of children: N/A  . Years of education: N/A   Occupational History  . Not on file.   Social History Main Topics  . Smoking status: Never Smoker  . Smokeless tobacco: Never Used  . Alcohol use No  . Drug use: No  . Sexual activity: Not on file   Other Topics Concern  . 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   [place]   Marital Status:single,widowed,2006.    Family History  Problem Relation Age of Onset  . Heart attack Father   . CAD Father   . Hip fracture Mother     complication  . Lung cancer Brother   . Heart attack Brother     massive  . Other Brother     mva  . Thyroid cancer Sister   . Atrial fibrillation Sister   . Ovarian cancer Sister     surviver   The patient does not have a history of early familial atrial fibrillation or other arrhythmias.  ROS- All systems are reviewed and negative except as per the HPI above.  Physical Exam: Vitals:   09/07/16 1417  BP: (!) 164/96  Pulse: (!) 117  Weight: 154 lb (69.9 kg)  Height: 5\' 3"  (1.6 m)    GEN- The patient is elderly appearing, alert and oriented x 3 today.   Head- normocephalic, atraumatic Eyes-  Sclera clear, conjunctiva pink Ears- hearing intact Oropharynx- clear Neck- supple  Lungs- Clear to ausculation bilaterally, normal work of breathing Heart- Tachycardic irregular rate and rhythm  GI- soft, NT, ND, + BS Extremities- no clubbing, cyanosis, +BLE dependent edema  MS- no significant deformity or atrophy Skin- no rash or lesion Psych- euthymic mood, full  affect Neuro- strength and sensation are intact  Wt Readings from Last 3 Encounters:  09/07/16 154 lb (69.9 kg)  05/28/16 154 lb (69.9 kg)  02/19/16 151 lb (68.5 kg)    EKG today demonstrates atrial fibrillation with likely pulmonary vein trigger, rate 117 Echo 07/2014 demonstrated EF 60-65%, no RWMA  Epic records are reviewed at length today  Assessment and Plan:  1. Persistent atrial fibrillation The patient presents with recurrent persistent atrial fibrillation.  She has previously not had success with Flecainide or Rhythmol.  She has done very well with low burden of AF since last ablation aside from this one episode.  I think that attempt at cardioversion without adding AAD therapy is reasonable first step.  Other medical therapy will  be limited by bradycardia, see below.  Will plan DCCV tomorrow at time of PPM implant Continue Warfarin long term for CHADS2VASC of 4  2. Asystole The patient had a period of asystole with underlying atrial fibrillation last Wednesday at the time of her pre-syncopal spell.  I suspect she actually did pass out as opposed to "fall asleep".   Her episode was preceded by prolongation of R-R intervals, but lasted for >15 seconds.  Discussed with Dr Rayann Heman today. Because of need for rate controlling medications long term, I have recommended pacemaker implant for tachy/brady syndrome.  She also has heart rate recordings at home in the 40's in SR although histograms on ILR for the most part are reasonable.  Risks, benefits to pacemaker implantation discussed with the patient today who wishes to proceed. I have also discussed with her son Corene Cornea by phone who lives in [place].   No driving until after pacemaker placed - her other son, Fara Olden, will pick her up from clinic today.  Plan PPM tomorrow with Dr Rayann Heman with ILR removal at same time.   3. HTN Blood pressure elevated today With asystole on ILR, will add Amlodipine 5mg  daily for now  I have offered  admission today, but the patient would prefer to go home tonight and come back tomorrow for procedure.  ER precautions given. Follow up with me at [facility] after PPM implant.   Chanetta Marshall, NP 09/07/2016 4:56 PM

## 2016-09-09 ENCOUNTER — Ambulatory Visit (HOSPITAL_COMMUNITY): Payer: Medicare Other

## 2016-09-09 ENCOUNTER — Encounter (HOSPITAL_COMMUNITY): Payer: Self-pay | Admitting: Internal Medicine

## 2016-09-09 DIAGNOSIS — I495 Sick sinus syndrome: Secondary | ICD-10-CM

## 2016-09-09 DIAGNOSIS — K219 Gastro-esophageal reflux disease without esophagitis: Secondary | ICD-10-CM | POA: Diagnosis not present

## 2016-09-09 DIAGNOSIS — I442 Atrioventricular block, complete: Secondary | ICD-10-CM | POA: Diagnosis not present

## 2016-09-09 DIAGNOSIS — I10 Essential (primary) hypertension: Secondary | ICD-10-CM | POA: Diagnosis not present

## 2016-09-09 DIAGNOSIS — I481 Persistent atrial fibrillation: Secondary | ICD-10-CM

## 2016-09-09 LAB — BASIC METABOLIC PANEL
Anion gap: 7 (ref 5–15)
BUN: 16 mg/dL (ref 6–20)
CHLORIDE: 104 mmol/L (ref 101–111)
CO2: 27 mmol/L (ref 22–32)
CREATININE: 0.92 mg/dL (ref 0.44–1.00)
Calcium: 9 mg/dL (ref 8.9–10.3)
GFR, EST NON AFRICAN AMERICAN: 59 mL/min — AB (ref >60)
Glucose, Bld: 97 mg/dL (ref 65–99)
POTASSIUM: 4.3 mmol/L (ref 3.5–5.1)
SODIUM: 138 mmol/L (ref 135–145)

## 2016-09-09 LAB — PROTIME-INR
INR: 2.85
Prothrombin Time: 30.5 seconds — ABNORMAL HIGH (ref 11.4–15.2)

## 2016-09-09 MED ORDER — SOTALOL HCL 80 MG PO TABS: 80.0000 mg | ORAL_TABLET | Freq: Two times a day (BID) | ORAL | 1 refills | Status: DC

## 2016-09-09 NOTE — Progress Notes (Signed)
09/09/2016 3:39 PM Discharge AVS meds taken today and those due this evening reviewed.  Follow-up appointments and when to call md reviewed.  D/C IV and TELE.  Questions and concerns addressed.   D/C home per orders. [place]

## 2016-09-09 NOTE — Discharge Summary (Signed)
ELECTROPHYSIOLOGY PROCEDURE DISCHARGE SUMMARY    Patient ID: Monica [name],  MRN: [id number], DOB/AGE: Mar 20, 1941 76 y.o.  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  GERD 2.  Hypertension 3.  Hyperlipidemia 4.  Osteoporosis  Allergies  Allergen Reactions  . Codeine Nausea And Vomiting     Procedures This Admission:  1.  Implantation of a MDT dual chamber PPM on 09/08/16 by Dr Rayann Heman.  The patient received a MDT model number Advisa PPM with model number 5076 right atrial lead and 5076 right ventricular lead. There were no immediate post procedure complications.  ILR removal also performed 2.  DCCV on 09/08/16 by Dr Rayann Heman. This resulted in conversion to SR with 1 shock.  No early apparent complications.  3.  CXR on 09/09/16 demonstrated no pneumothorax status post device implantation.   Brief HPI: Monica [name] is a 76 y.o. female with a past medical history as outlined above. She has persistent atrial fibrillation and has undergone PVI in 2013 and 2015.  She presented to the AF clinic the day before admission with complaints of persistent atrial fibrillation for several days as well as a pre-syncopal spell last Wednesday.  On ILR interrogation, device demonstrated a period of 15seconds of complete heart block/asystole during time of pre-syncope.  Risks, benefits, and alternatives to PPM implantation were reviewed with the patient who wished to proceed.   Hospital Course:  The patient was admitted and underwent implantation of a MDT dual chamber pacemaker, ILR removal, as well as DCCV with details as outlined above.  She  was monitored on  telemetry overnight which demonstrated atrial pacing with intrinsic ventricular conduction. She was started on Sotalol post cardioversion with stable QTc interval.  Left chest was without hematoma or ecchymosis.  The device was interrogated and found to be functioning normally.  CXR was obtained and demonstrated no pneumothorax status post device implantation.  Wound care, arm mobility, and restrictions were reviewed with the patient.  The patient was examined and considered stable for discharge to home. She will have early follow up with me in the AF clinic for wound check and Sotalol management.   INR will be checked at visit on 09/21/16.   Physical Exam: Vitals:   09/08/16 1900 09/08/16 2000 09/08/16 2100 09/09/16 0439  BP: (!) 117/53 (!) 109/48 (!) 120/56 (!) 122/45  Pulse: 72 69 67 69  Resp:   18 17  Temp:   97.7 F (36.5 C) 98.3 F (36.8 C)  TempSrc:   Oral Oral  SpO2:   96% 97%  Weight:      Height:        GEN- The patient is elderly appearing, alert and oriented x 3 today.   HEENT: normocephalic, atraumatic; sclera clear, conjunctiva pink; hearing intact; oropharynx clear; neck supple  Lungs- Clear to ausculation bilaterally, normal work of breathing.  No wheezes, rales, rhonchi Heart- Regular rate and rhythm  GI- soft, non-tender, non-distended, bowel sounds present  Extremities- no clubbing, cyanosis, or edema  MS- no significant deformity or atrophy Skin- warm and dry, no rash or lesion, left chest without hematoma/ecchymosis Psych- euthymic mood, full affect Neuro- strength and sensation are intact   Labs:   Lab Results  Component Value Date  WBC 7.6 09/07/2016   HGB 13.0 09/07/2016   HCT 41.0 09/07/2016   MCV 83.0 09/07/2016   PLT 335 09/07/2016     Recent Labs Lab 09/09/16 0319  NA 138  K 4.3  CL 104  CO2 27  BUN 16  CREATININE 0.92  CALCIUM 9.0  GLUCOSE 97    Discharge Medications:  Allergies as of 09/09/2016      Reactions   Codeine Nausea And  Vomiting      Medication List    STOP taking these medications   amLODipine 5 MG tablet Commonly known as:  NORVASC     TAKE these medications   acetaminophen 500 MG tablet Commonly known as:  TYLENOL Take 500 mg by mouth every 6 (six) hours as needed. For pain   calcium carbonate 600 MG Tabs tablet Commonly known as:  OS-CAL Take 600 mg by mouth 2 (two) times daily with a meal.   cholecalciferol 1000 units tablet Commonly known as:  VITAMIN D Take 1,000 Units by mouth every evening.   losartan 100 MG tablet Commonly known as:  COZAAR Take 1 tablet (100 mg total) by mouth daily.   metoprolol tartrate 25 MG tablet Commonly known as:  LOPRESSOR TAKE 1 & 1/2 TABLETS BY MOUTH TWICE A DAY   pantoprazole 40 MG tablet Commonly known as:  PROTONIX Take 40 mg by mouth daily.   pravastatin 20 MG tablet Commonly known as:  PRAVACHOL Take 20 mg by mouth daily.   sotalol 80 MG tablet Commonly known as:  BETAPACE Take 1 tablet (80 mg total) by mouth 2 (two) times daily.   traZODone 50 MG tablet Commonly known as:  DESYREL Take 25-50 mg by mouth at bedtime as needed for sleep.   warfarin 5 MG tablet Commonly known as:  COUMADIN TAKE AS DIRECTED BY THE COUMADIN CLINIC       Disposition:  Discharge Instructions    Diet - low sodium heart healthy    Complete by:  As directed    Increase activity slowly    Complete by:  As directed      Follow-up Information    [facility] Follow up on 09/21/2016.   Specialty:  Cardiology Why:  at 9:30AM  Contact information: [id number] [place] [phone]          Duration of Discharge Encounter: Greater than 30 minutes including physician time.  Signed, Chanetta Marshall, NP 09/09/2016 8:42 AM  I have seen, examined the patient, and reviewed the above assessment and plan. On exam, RRR.   Changes to above are made where necessary.  Device  interrogation and cxray are reviewed.   Qt is stable on sotalol.  Will bring back to office on Friday am for ekg.  DC to home today.  Co Sign: [place] Grayer, MD 09/09/2016 9:04 AM

## 2016-09-09 NOTE — Discharge Instructions (Signed)
° ° °  Supplemental Discharge Instructions for  Pacemaker/Defibrillator Patients  Activity No heavy lifting or vigorous activity with your left/right arm for 6 to 8 weeks.  Do not raise your left/right arm above your head for one week.  Gradually raise your affected arm as drawn below.           __       09/13/16                  09/14/16                    09/15/16                   09/16/16  NO DRIVING for  1 week   ; you may begin driving on  [id number]   .  WOUND CARE - Keep the wound area clean and dry.  Do not get this area wet for one week. No showers for one week; you may shower on   09/16/16  . - The tape/steri-strips on your wound will fall off; do not pull them off.  No bandage is needed on the site.  DO  NOT apply any creams, oils, or ointments to the wound area. - If you notice any drainage or discharge from the wound, any swelling or bruising at the site, or you develop a fever > 101? F after you are discharged home, call the office at once.  Special Instructions - You are still able to use cellular telephones; use the ear opposite the side where you have your pacemaker/defibrillator.  Avoid carrying your cellular phone near your device. - When traveling through airports, show security personnel your identification card to avoid being screened in the metal detectors.  Ask the security personnel to use the hand wand. - Avoid arc welding equipment, TENS units (transcutaneous nerve stimulators).  Call the office for questions about other devices. - Avoid electrical appliances that are in poor condition or are not properly grounded. - Microwave ovens are safe to be near or to operate.

## 2016-09-11 ENCOUNTER — Ambulatory Visit (HOSPITAL_COMMUNITY)
Admission: RE | Admit: 2016-09-11 | Discharge: 2016-09-11 | Disposition: A | Discharge status: Home / Self Care | Payer: Medicare Other | Source: Ambulatory Visit | Attending: Nurse Practitioner | Admitting: Nurse Practitioner

## 2016-09-11 ENCOUNTER — Telehealth: Payer: Self-pay | Admitting: Internal Medicine

## 2016-09-11 DIAGNOSIS — I481 Persistent atrial fibrillation: Secondary | ICD-10-CM | POA: Diagnosis not present

## 2016-09-11 NOTE — Telephone Encounter (Signed)
Called [employer] back and let them know that the loop recorder had been removed on the same day as the pacemaker implanted

## 2016-09-11 NOTE — Telephone Encounter (Signed)
New Message:    She wants to know if pt still has a loop recorder,or have it been removed from the pt,or pt still has it,but been turned off or is still active?If it was removed or turned off,when?

## 2016-09-11 NOTE — Progress Notes (Addendum)
Pt in for ekg post hospital after starting sotalol. Roderic Palau NP to review  Pt with new  Sotalol start and in for f/u EKG. EKG shows atrial paced rhythm with prolonged AV conduction and pr int 246 ms, qrs int 74 ms, qtc 444 ms. She is feeling well. No afib. Following arm restrictions with newly placed PPM. Site inspected and reveals intact steri strips, no redness, swelling or drainage. Has f/u with Dr. Rayann Heman 2/5.

## 2016-09-18 ENCOUNTER — Ambulatory Visit (INDEPENDENT_AMBULATORY_CARE_PROVIDER_SITE_OTHER): Payer: Medicare Other | Admitting: *Deleted

## 2016-09-18 DIAGNOSIS — R001 Bradycardia, unspecified: Secondary | ICD-10-CM | POA: Diagnosis not present

## 2016-09-21 ENCOUNTER — Ambulatory Visit (HOSPITAL_COMMUNITY)
Admission: RE | Admit: 2016-09-21 | Discharge: 2016-09-21 | Disposition: A | Discharge status: Home / Self Care | Payer: Medicare Other | Source: Ambulatory Visit | Attending: Nurse Practitioner | Admitting: Nurse Practitioner

## 2016-09-21 ENCOUNTER — Encounter (HOSPITAL_COMMUNITY): Payer: Self-pay | Admitting: Nurse Practitioner

## 2016-09-21 VITALS — BP 136/94 | HR 70 | Ht 63.0 in | Wt 153.2 lb

## 2016-09-21 DIAGNOSIS — I480 Paroxysmal atrial fibrillation: Secondary | ICD-10-CM | POA: Diagnosis not present

## 2016-09-21 DIAGNOSIS — I495 Sick sinus syndrome: Secondary | ICD-10-CM | POA: Diagnosis not present

## 2016-09-21 DIAGNOSIS — Z9889 Other specified postprocedural states: Secondary | ICD-10-CM | POA: Diagnosis not present

## 2016-09-21 DIAGNOSIS — I10 Essential (primary) hypertension: Secondary | ICD-10-CM

## 2016-09-21 DIAGNOSIS — I4891 Unspecified atrial fibrillation: Secondary | ICD-10-CM | POA: Diagnosis not present

## 2016-09-21 DIAGNOSIS — Z9049 Acquired absence of other specified parts of digestive tract: Secondary | ICD-10-CM | POA: Diagnosis not present

## 2016-09-21 DIAGNOSIS — Z79899 Other long term (current) drug therapy: Secondary | ICD-10-CM | POA: Diagnosis not present

## 2016-09-21 DIAGNOSIS — Z5189 Encounter for other specified aftercare: Secondary | ICD-10-CM | POA: Diagnosis not present

## 2016-09-21 LAB — CUP PACEART INCLINIC DEVICE CHECK
Brady Statistic AP VP Percent: 0.04 %
Brady Statistic AP VS Percent: 91.69 %
Brady Statistic AS VP Percent: 0 %
Brady Statistic RV Percent Paced: 0.04 %
Date Time Interrogation Session: 20180202222719
Implantable Lead Implant Date: 20180123
Implantable Lead Implant Date: 20180123
Implantable Lead Location: 753860
Implantable Lead Model: 5076
Lead Channel Impedance Value: 342 Ohm
Lead Channel Impedance Value: 456 Ohm
Lead Channel Impedance Value: 589 Ohm
Lead Channel Pacing Threshold Pulse Width: 0.4 ms
Lead Channel Pacing Threshold Pulse Width: 0.4 ms
Lead Channel Sensing Intrinsic Amplitude: 1.875 mV
Lead Channel Sensing Intrinsic Amplitude: 3 mV
Lead Channel Sensing Intrinsic Amplitude: 8.125 mV
Lead Channel Setting Pacing Amplitude: 3.5 V
Lead Channel Setting Pacing Pulse Width: 0.4 ms
MDC IDC LEAD LOCATION: 753859
MDC IDC MSMT BATTERY VOLTAGE: 3.11 V
MDC IDC MSMT LEADCHNL RA IMPEDANCE VALUE: 418 Ohm
MDC IDC MSMT LEADCHNL RA PACING THRESHOLD AMPLITUDE: 0.5 V
MDC IDC MSMT LEADCHNL RV PACING THRESHOLD AMPLITUDE: 0.75 V
MDC IDC MSMT LEADCHNL RV SENSING INTR AMPL: 8.875 mV
MDC IDC PG IMPLANT DT: 20180123
MDC IDC SET LEADCHNL RV PACING AMPLITUDE: 3.5 V
MDC IDC SET LEADCHNL RV SENSING SENSITIVITY: 2 mV
MDC IDC STAT BRADY AS VS PERCENT: 8.27 %
MDC IDC STAT BRADY RA PERCENT PACED: 91.47 %

## 2016-09-21 NOTE — Progress Notes (Signed)
Wound check appointment. Steri-strips removed from PPM site and loop explant site. Wounds without redness or edema. Loop explant site well healed. Right lateral side of pacemaker incision site superficially un-approximated, steri-strips applied. Normal device function. Thresholds, sensing, and impedances consistent with implant measurements. Device programmed at 3.5V/auto capture programmed on for extra safety margin until 3 month visit. Histogram distribution appropriate for patient and level of activity. No mode switches or high ventricular rates noted. Patient educated about wound care, arm mobility, lifting restrictions. ROV with device clinic 09/24/2016 for wound re-check

## 2016-09-21 NOTE — Progress Notes (Signed)
Electrophysiology Office Note   Date:  09/21/2016   ID:  Monica [name], DOB Mar 26, 1941, MRN [id number]    PCP:  Reginia Naas, MD  Cardiologist:  Dr Radford Pax Primary Electrophysiologist: [place] Grayer, MD    CC: afib   History of Present Illness: Monica [name] is a 76 y.o. female who presents today for electrophysiology evaluation. Doing very well s/p PPM implant and initiation of sotalol.  She is pleased with her current state.  Denies procedure related complications.  She is having no afib.  No pauses or presyncope. Today, she denies symptoms of palpitations  shortness of breath, orthopnea, PND, lower extremity edema, claudication, dizziness, syncope, bleeding, or neurologic sequela. The patient is tolerating medications without difficulties and is otherwise without complaint today.    Past Medical History:  Diagnosis Date  . Adrenal adenoma 6/11   lt on ct 6/11  . GERD (gastroesophageal reflux disease)   . Hypercholesteremia   . Hypertension    [facility]  . Kidney stone 6/11   rt. on ct 6/11  . Normal nuclear stress test 2008   low risk  . Osteoporosis   . Paroxysmal atrial fibrillation ([place])    a. PVI 10/13 b. PVI 12/15   Past Surgical History:  Procedure Laterality Date  . ATRIAL FIBRILLATION ABLATION N/A 06/07/2012   PVI and CTI ablation by Dr Rayann Heman  . ATRIAL FIBRILLATION ABLATION N/A 07/31/2014   PVI by Dr Rayann Heman; initial ablation 2013  . CARDIOVERSION  05/06/2012   Procedure: CARDIOVERSION;  Surgeon: Sueanne Margarita, MD;  [facility];  Service: Cardiovascular;  Laterality: N/A;  h/p in file drawer  . CESAREAN SECTION  [phone]   x-2  . CHOLECYSTECTOMY  1989  . ELECTROPHYSIOLOGIC STUDY N/A 09/08/2016   Procedure: Cardioversion;  Surgeon: [facility] Grayer, MD;  Location: [facility];  Service: Cardiovascular;  Laterality: N/A;  . EP IMPLANTABLE DEVICE N/A 09/08/2016   Procedure: Loop Recorder Removal;  Surgeon: [facility] Grayer, MD;   Location: [place] [facility];  Service: Cardiovascular;  Laterality: N/A;  . EP IMPLANTABLE DEVICE N/A 09/08/2016   Procedure: Pacemaker Implant;  Surgeon: [facility] Grayer, MD;  Location: [place] [facility];  Service: Cardiovascular;  Laterality: N/A;  . Implantable loop recorder implantation  05/30/14   [employer]tronic Reveal LINQ implanted by Dr Rayann Heman with RIO II protocol (ambulatory setting)  . TEE WITHOUT CARDIOVERSION  06/06/2012   Procedure: TRANSESOPHAGEAL ECHOCARDIOGRAM (TEE);  Surgeon: Jettie Booze, MD;  Location: [place];  Service: Cardiovascular;  Laterality: N/A;  . TEE WITHOUT CARDIOVERSION N/A 07/30/2014   Procedure: TRANSESOPHAGEAL ECHOCARDIOGRAM (TEE);  Surgeon: Larey Dresser, MD;  Location: [facility] END[facility]COPY;  Service: Cardiovascular;  Laterality: N/A;     Current Outpatient Prescriptions  Medication Sig Dispense Refill  . acetaminophen (TYLENOL) 500 MG tablet Take 500 mg by mouth every 6 (six) hours as needed. For pain    . calcium carbonate (OS-CAL) 600 MG TABS tablet Take 600 mg by mouth 2 (two) times daily with a meal.     . cholecalciferol (VITAMIN D) 1000 UNITS tablet Take 1,000 Units by mouth every evening.     Marland Kitchen losartan (COZAAR) 100 MG tablet Take 1 tablet (100 mg total) by mouth daily. 90 tablet 3  . metoprolol tartrate (LOPRESSOR) 25 MG tablet TAKE 1 & 1/2 TABLETS BY MOUTH TWICE A DAY 270 tablet 2  . pantoprazole (PROTONIX) 40 MG tablet Take 40 mg by mouth daily.    Marland Kitchen  pravastatin (PRAVACHOL) 20 MG tablet Take 20 mg by mouth daily.  1  . sotalol (BETAPACE) 80 MG tablet Take 1 tablet (80 mg total) by mouth 2 (two) times daily. 60 tablet 1  . traZODone (DESYREL) 50 MG tablet Take 25-50 mg by mouth at bedtime as needed for sleep.     Marland Kitchen warfarin (COUMADIN) 5 MG tablet TAKE AS DIRECTED BY THE COUMADIN CLINIC 30 tablet 3   No current facility-administered medications for this encounter.     Allergies:   Codeine   Social History:  The patient  reports that she has  never smoked. She has never used smokeless tobacco. She reports that she does not drink alcohol or use drugs.   Family History:  The patient's  family history includes Atrial fibrillation in her sister; CAD in her father; Heart attack in her brother and father; Hip fracture in her mother; Lung cancer in her brother; Other in her brother; Ovarian cancer in her sister; Thyroid cancer in her sister.    ROS:  Please see the history of present illness.   All other systems are reviewed and negative.    PHYSICAL EXAM: VS:  BP (!) 136/94 (BP Location: Left Arm, Patient Position: Sitting, Cuff Size: Normal)   Pulse 70   Ht 5\' 3"  (1.6 m)   Wt 153 lb 3.2 oz (69.5 kg)   BMI 27.14 kg/m  , BMI Body mass index is 27.14 kg/m. GEN: Well nourished, well developed, in no acute distress  HEENT: normal  Neck: no JVD, carotid bruits, or masses Cardiac: RRR; no murmurs, rubs, or gallops,no edema  Respiratory:  clear to auscultation bilaterally, normal work of breathing GI: soft, nontender, nondistended, + BS MS: no deformity or atrophy  Skin: oacemaker site is without hematoma Neuro:  Strength and sensation are intact Psych: euthymic mood, full affect   Recent Labs: 09/07/2016: Hemoglobin 13.0; Platelets 335 09/09/2016: BUN 16; Creatinine, Ser 0.92; Potassium 4.3; Sodium 138    Wt Readings from Last 3 Encounters:  09/21/16 153 lb 3.2 oz (69.5 kg)  09/08/16 158 lb (71.7 kg)  09/07/16 154 lb (69.9 kg)    ekg today reveals atrial pacing at 70 bpm, PR 270 msec, Qtc 449 msec, LAD  ASSESSMENT AND PLAN:  1.  afib Doing well s/p initiation of sotalol.  Qtc is stable chads2vasc score is at least 3 Continue long term anticoagulation  2. HTN Elevated I have offered norvasc.  She does not wish to make changes today  3. Tachy/brady syndrome Doing well s/p PPM Would reducing lower pacing rate to 60 bpm when she follows up later this week in the device clinic.  Current medicines are reviewed at length  with the patient today.   The patient does not have concerns regarding her medicines.  The following changes were made today:  none  Follow-up with me in 3 months Follow-up with Dr Radford Pax as scheduled  Signed, [facility] Grayer, MD  09/21/2016 9:59 AM

## 2016-09-24 ENCOUNTER — Ambulatory Visit (INDEPENDENT_AMBULATORY_CARE_PROVIDER_SITE_OTHER): Payer: Medicare Other | Admitting: *Deleted

## 2016-09-24 DIAGNOSIS — Z959 Presence of cardiac and vascular implant and graft, unspecified: Secondary | ICD-10-CM

## 2016-09-24 LAB — CUP PACEART INCLINIC DEVICE CHECK
Date Time Interrogation Session: 20180208091426
Implantable Lead Location: 753859
MDC IDC LEAD IMPLANT DT: 20180123
MDC IDC LEAD IMPLANT DT: 20180123
MDC IDC LEAD LOCATION: 753860
MDC IDC PG IMPLANT DT: 20180123

## 2016-09-24 NOTE — Progress Notes (Signed)
Seen for wound re-check, steri-strips removed wound well-healed, incision edges approximated. Lower rate reprogrammed to 60bpm per JA note on 09/21/2016

## 2016-09-28 LAB — CUP PACEART REMOTE DEVICE CHECK
Date Time Interrogation Session: 20180101230837
MDC IDC PG IMPLANT DT: 20151014

## 2016-10-16 ENCOUNTER — Ambulatory Visit (INDEPENDENT_AMBULATORY_CARE_PROVIDER_SITE_OTHER): Payer: Medicare Other | Admitting: *Deleted

## 2016-10-16 DIAGNOSIS — I4891 Unspecified atrial fibrillation: Secondary | ICD-10-CM

## 2016-10-16 DIAGNOSIS — Z5181 Encounter for therapeutic drug level monitoring: Secondary | ICD-10-CM | POA: Diagnosis not present

## 2016-10-16 LAB — POCT INR: INR: 2.3

## 2016-11-03 ENCOUNTER — Other Ambulatory Visit: Payer: Self-pay | Admitting: Nurse Practitioner

## 2016-11-05 ENCOUNTER — Other Ambulatory Visit: Payer: Self-pay | Admitting: Cardiology

## 2016-11-19 ENCOUNTER — Encounter: Payer: Self-pay | Admitting: Internal Medicine

## 2016-11-27 ENCOUNTER — Ambulatory Visit (INDEPENDENT_AMBULATORY_CARE_PROVIDER_SITE_OTHER): Payer: Medicare Other | Admitting: *Deleted

## 2016-11-27 DIAGNOSIS — I4891 Unspecified atrial fibrillation: Secondary | ICD-10-CM

## 2016-11-27 DIAGNOSIS — Z5181 Encounter for therapeutic drug level monitoring: Secondary | ICD-10-CM | POA: Diagnosis not present

## 2016-11-27 LAB — POCT INR: INR: 2.7

## 2016-12-07 ENCOUNTER — Ambulatory Visit (INDEPENDENT_AMBULATORY_CARE_PROVIDER_SITE_OTHER): Payer: Medicare Other | Admitting: Internal Medicine

## 2016-12-07 ENCOUNTER — Encounter: Payer: Self-pay | Admitting: Internal Medicine

## 2016-12-07 VITALS — BP 172/86 | HR 61 | Ht 63.0 in | Wt 156.6 lb

## 2016-12-07 DIAGNOSIS — I10 Essential (primary) hypertension: Secondary | ICD-10-CM | POA: Diagnosis not present

## 2016-12-07 DIAGNOSIS — R001 Bradycardia, unspecified: Secondary | ICD-10-CM

## 2016-12-07 DIAGNOSIS — I480 Paroxysmal atrial fibrillation: Secondary | ICD-10-CM

## 2016-12-07 NOTE — Progress Notes (Signed)
Electrophysiology Office Note   Date:  12/07/2016   ID:  Monica [name], DOB 02-Sep-1940, MRN [id number]    PCP:  Reginia Naas, MD  Cardiologist:  Dr Radford Pax Primary Electrophysiologist: Thompson Grayer, MD    CC: afib   History of Present Illness: Monica [name] is a 76 y.o. female who presents today for electrophysiology evaluation.  She has rare afib.  No pauses or presyncope. Today, she denies symptoms of palpitations  shortness of breath, orthopnea, PND, lower extremity edema, claudication, dizziness, syncope, bleeding, or neurologic sequela. The patient is tolerating medications without difficulties and is otherwise without complaint today.    Past Medical History:  Diagnosis Date  . Adrenal adenoma 6/11   lt on ct 6/11  . GERD (gastroesophageal reflux disease)   . Hypercholesteremia   . Hypertension    [facility]  . Kidney stone 6/11   rt. on ct 6/11  . Normal nuclear stress test 2008   low risk  . Osteoporosis   . Paroxysmal atrial fibrillation ([place])    a. PVI 10/13 b. PVI 12/15   Past Surgical History:  Procedure Laterality Date  . ATRIAL FIBRILLATION ABLATION N/A 06/07/2012   PVI and CTI ablation by Dr Rayann Heman  . ATRIAL FIBRILLATION ABLATION N/A 07/31/2014   PVI by Dr Rayann Heman; initial ablation 2013  . CARDIOVERSION  05/06/2012   Procedure: CARDIOVERSION;  Surgeon: Sueanne Margarita, MD;  [facility];  Service: Cardiovascular;  Laterality: N/A;  h/p in file drawer  . CESAREAN SECTION  [phone]   x-2  . CHOLECYSTECTOMY  1989  . ELECTROPHYSIOLOGIC STUDY N/A 09/08/2016   Procedure: Cardioversion;  Surgeon: Thompson Grayer, MD;  Location: [place] [facility];  Service: Cardiovascular;  Laterality: N/A;  . EP IMPLANTABLE DEVICE N/A 09/08/2016   Procedure: Loop Recorder Removal;  Surgeon: Thompson Grayer, MD;  Location: [place] [facility];  Service: Cardiovascular;  Laterality: N/A;  . EP IMPLANTABLE DEVICE N/A 09/08/2016   Procedure: Pacemaker Implant;  Surgeon:  Thompson Grayer, MD;  Location: [place] [facility];  Service: Cardiovascular;  Laterality: N/A;  . Implantable loop recorder implantation  05/30/14   Medtronic Reveal LINQ implanted by Dr Rayann Heman with RIO II protocol (ambulatory setting)  . TEE WITHOUT CARDIOVERSION  06/06/2012   Procedure: TRANSESOPHAGEAL ECHOCARDIOGRAM (TEE);  Surgeon: Jettie Booze, MD;  Location: [place];  Service: Cardiovascular;  Laterality: N/A;  . TEE WITHOUT CARDIOVERSION N/A 07/30/2014   Procedure: TRANSESOPHAGEAL ECHOCARDIOGRAM (TEE);  Surgeon: Larey Dresser, MD;  Location: [facility];  Service: Cardiovascular;  Laterality: N/A;     Current Outpatient Prescriptions  Medication Sig Dispense Refill  . acetaminophen (TYLENOL) 500 MG tablet Take 500 mg by mouth every 6 (six) hours as needed. For pain    . calcium carbonate (OS-CAL) 600 MG TABS tablet Take 600 mg by mouth 2 (two) times daily with a meal.     . cholecalciferol (VITAMIN D) 1000 UNITS tablet Take 1,000 Units by mouth every evening.     Marland Kitchen losartan (COZAAR) 100 MG tablet Take 1 tablet (100 mg total) by mouth daily. 90 tablet 3  . metoprolol tartrate (LOPRESSOR) 25 MG tablet TAKE 1 & 1/2 TABLETS BY MOUTH TWICE A DAY 270 tablet 2  . pantoprazole (PROTONIX) 40 MG tablet Take 40 mg by mouth daily.    . pravastatin (PRAVACHOL) 20 MG tablet Take 20 mg by mouth daily.  1  . sotalol (BETAPACE) 80 MG tablet TAKE 1 TABLET (80 MG TOTAL) BY MOUTH  2 (TWO) TIMES DAILY. 60 tablet 3  . traZODone (DESYREL) 50 MG tablet Take 25-50 mg by mouth at bedtime as needed for sleep.     Marland Kitchen warfarin (COUMADIN) 5 MG tablet TAKE AS DIRECTED BY THE COUMADIN CLINIC 30 tablet 3   No current facility-administered medications for this visit.     Allergies:   Morphine and related and Codeine   Social History:  The patient  reports that she has never smoked. She has never used smokeless tobacco. She reports that she does not drink alcohol or use drugs.   Family History:  The  patient's  family history includes Atrial fibrillation in her sister; CAD in her father; Heart attack in her brother and father; Hip fracture in her mother; Lung cancer in her brother; Other in her brother; Ovarian cancer in her sister; Thyroid cancer in her sister.   ROS:  Please see the history of present illness.   All other systems are reviewed and negative.   PHYSICAL EXAM: VS:  BP (!) 172/86   Pulse 61   Ht 5\' 3"  (1.6 m)   Wt 156 lb 9.6 oz (71 kg)   SpO2 99%   BMI 27.74 kg/m  , BMI Body mass index is 27.74 kg/m. GEN: Well nourished, well developed, in no acute distress  HEENT: normal  Neck: no JVD, carotid bruits, or masses Cardiac: RRR;  no edema  Respiratory:  clear to auscultation bilaterally, normal work of breathing GI: soft, nontender, nondistended, + BS MS: no deformity or atrophy  Skin: pacemaker site is well healed Neuro:  Strength and sensation are intact Psych: euthymic mood, full affect   Recent Labs: 09/07/2016: Hemoglobin 13.0; Platelets 335 09/09/2016: BUN 16; Creatinine, Ser 0.92; Potassium 4.3; Sodium 138    Wt Readings from Last 3 Encounters:  12/07/16 156 lb 9.6 oz (71 kg)  09/21/16 153 lb 3.2 oz (69.5 kg)  09/08/16 158 lb (71.7 kg)    ekg today reveals atrial pacing at 61 bpm, PR 208 msec, Qtc 457 msec   ASSESSMENT AND PLAN:  1.  afib Doing well s/p initiation of sotalol.  Qtc is stable AF burden is 1.3% Lifestyle modification is encouraged Could consider increasing sotalol to 120mg  BID in the future if her afib progresses.  She does not wish to make changes today chads2vasc score is at least 3 Continue long term anticoagulation bmet, mg today on sotalol  2. HTN Elevated I have offered norvasc.  She does not wish to make changes again today.  I have informed her that we cannot control her afib with uncontrolled BP.  Sodium restriction and weight loss are advised I have given flyer to the AF clinic weight loss session. bmet today  3.  Tachy/brady syndrome Normal pacemaker function See Pace Art report No changes today  Current medicines are reviewed at length with the patient today.   The patient does not have concerns regarding her medicines.  The following changes were made today:  none  Follow-up with Butch Penny in AF clinic in 3 months I will see in 6 months [facility] Follow-up with Dr Radford Pax as scheduled  Signed, Thompson Grayer, MD  12/07/2016 11:08 AM

## 2016-12-07 NOTE — Patient Instructions (Addendum)
Medication Instructions:  Your physician recommends that you continue on your current medications as directed. Please refer to the Current Medication list given to you today.   Labwork: Your physician recommends that you return for lab work today: BMP/CBC   Testing/Procedures: None ordered   Follow-Up:  Your physician recommends that you schedule a follow-up appointment in: 3 months with Roderic Palau, NP and 6 months with Dr Rayann Heman   Any Other Special Instructions Will Be Listed Below (If Applicable).   Low-Sodium Eating Plan Sodium, which is an element that makes up salt, helps you maintain a healthy balance of fluids in your body. Too much sodium can increase your blood pressure and cause fluid and waste to be held in your body. Your health care provider or dietitian may recommend following this plan if you have high blood pressure (hypertension), kidney disease, liver disease, or heart failure. Eating less sodium can help lower your blood pressure, reduce swelling, and protect your heart, liver, and kidneys. What are tips for following this plan? General guidelines   Most people on this plan should limit their sodium intake to 4,000 mg (milligrams) of sodium each day. Reading food labels   The Nutrition Facts label lists the amount of sodium in one serving of the food. If you eat more than one serving, you must multiply the listed amount of sodium by the number of servings.  Choose foods with less than 140 mg of sodium per serving.  Avoid foods with 300 mg of sodium or more per serving. Shopping   Look for lower-sodium products, often labeled as "low-sodium" or "no salt added."  Always check the sodium content even if foods are labeled as "unsalted" or "no salt added".  Buy fresh foods.  Avoid canned foods and premade or frozen meals.  Avoid canned, cured, or processed meats  Buy breads that have less than 80 mg of sodium per slice. Cooking   Eat more home-cooked  food and less restaurant, buffet, and fast food.  Avoid adding salt when cooking. Use salt-free seasonings or herbs instead of table salt or sea salt. Check with your health care provider or pharmacist before using salt substitutes.  Cook with plant-based oils, such as canola, sunflower, or olive oil. Meal planning   When eating at a restaurant, ask that your food be prepared with less salt or no salt, if possible.  Avoid foods that contain MSG (monosodium glutamate). MSG is sometimes added to [place] food, bouillon, and some canned foods. What foods are recommended? The items listed may not be a complete list. Talk with your dietitian about what dietary choices are best for you. Grains  Low-sodium cereals, including oats, puffed wheat and rice, and shredded wheat. Low-sodium crackers. Unsalted rice. Unsalted pasta. Low-sodium bread. Whole-grain breads and whole-grain pasta. Vegetables  Fresh or frozen vegetables. "No salt added" canned vegetables. "No salt added" tomato sauce and paste. Low-sodium or reduced-sodium tomato and vegetable juice. Fruits  Fresh, frozen, or canned fruit. Fruit juice. Meats and other protein foods  Fresh or frozen (no salt added) meat, poultry, seafood, and fish. Low-sodium canned tuna and salmon. Unsalted nuts. Dried peas, beans, and lentils without added salt. Unsalted canned beans. Eggs. Unsalted nut butters. Dairy  Milk. Soy milk. Cheese that is naturally low in sodium, such as ricotta cheese, fresh mozzarella, or Swiss cheese Low-sodium or reduced-sodium cheese. Cream cheese. Yogurt. Fats and oils  Unsalted butter. Unsalted margarine with no trans fat. Vegetable oils such as canola or olive oils.  Seasonings and other foods  Fresh and dried herbs and spices. Salt-free seasonings. Low-sodium mustard and ketchup. Sodium-free salad dressing. Sodium-free light mayonnaise. Fresh or refrigerated horseradish. Lemon juice. Vinegar. Homemade, reduced-sodium, or  low-sodium soups. Unsalted popcorn and pretzels. Low-salt or salt-free chips. What foods are not recommended? The items listed may not be a complete list. Talk with your dietitian about what dietary choices are best for you. Grains  Instant hot cereals. Bread stuffing, pancake, and biscuit mixes. Croutons. Seasoned rice or pasta mixes. Noodle soup cups. Boxed or frozen macaroni and cheese. Regular salted crackers. Self-rising flour. Vegetables  Sauerkraut, pickled vegetables, and relishes. Olives. [place] fries. Onion rings. Regular canned vegetables (not low-sodium or reduced-sodium). Regular canned tomato sauce and paste (not low-sodium or reduced-sodium). Regular tomato and vegetable juice (not low-sodium or reduced-sodium). Frozen vegetables in sauces. Meats and other protein foods  Meat or fish that is salted, canned, smoked, spiced, or pickled. Bacon, ham, sausage, hotdogs, corned beef, chipped beef, packaged lunch meats, salt pork, jerky, pickled herring, anchovies, regular canned tuna, sardines, salted nuts. Dairy  Processed cheese and cheese spreads. Cheese curds. Blue cheese. Feta cheese. String cheese. Regular cottage cheese. Buttermilk. Canned milk. Fats and oils  Salted butter. Regular margarine. Ghee. Bacon fat. Seasonings and other foods  Onion salt, garlic salt, seasoned salt, table salt, and sea salt. Canned and packaged gravies. Worcestershire sauce. Tartar sauce. Barbecue sauce. Teriyaki sauce. Soy sauce, including reduced-sodium. Steak sauce. Fish sauce. Oyster sauce. Cocktail sauce. Horseradish that you find on the shelf. Regular ketchup and mustard. Meat flavorings and tenderizers. Bouillon cubes. Hot sauce and Tabasco sauce. Premade or packaged marinades. Premade or packaged taco seasonings. Relishes. Regular salad dressings. Salsa. Potato and tortilla chips. Corn chips and puffs. Salted popcorn and pretzels. Canned or dried soups. Pizza. Frozen entrees and pot  pies. Summary  Eating less sodium can help lower your blood pressure, reduce swelling, and protect your heart, liver, and kidneys.  Most people on this plan should limit their sodium intake to 1,500-2,000 mg (milligrams) of sodium each day.  Canned, boxed, and frozen foods are high in sodium. Restaurant foods, fast foods, and pizza are also very high in sodium. You also get sodium by adding salt to food.  Try to cook at home, eat more fresh fruits and vegetables, and eat less fast food, canned, processed, or prepared foods. This information is not intended to replace advice given to you by your health care provider. Make sure you discuss any questions you have with your health care provider. Document Released: 01/23/2002 Document Revised: 07/27/2016 Document Reviewed: 07/27/2016 Elsevier Interactive Patient Education  2017 [employer].      If you need a refill on your cardiac medications before your next appointment, please call your pharmacy.

## 2016-12-08 LAB — CBC WITH DIFFERENTIAL/PLATELET
BASOS: 1 %
Basophils Absolute: 0.1 10*3/uL (ref 0.0–0.2)
EOS (ABSOLUTE): 0.1 10*3/uL (ref 0.0–0.4)
EOS: 2 %
HEMATOCRIT: 38.1 % (ref 34.0–46.6)
Hemoglobin: 11.7 g/dL (ref 11.1–15.9)
Immature Grans (Abs): 0 10*3/uL (ref 0.0–0.1)
Immature Granulocytes: 0 %
LYMPHS: 34 %
Lymphocytes Absolute: 2.1 10*3/uL (ref 0.7–3.1)
MCH: 25.3 pg — ABNORMAL LOW (ref 26.6–33.0)
MCHC: 30.7 g/dL — AB (ref 31.5–35.7)
MCV: 82 fL (ref 79–97)
MONOS ABS: 0.5 10*3/uL (ref 0.1–0.9)
Monocytes: 7 %
NEUTROS PCT: 56 %
Neutrophils Absolute: 3.5 10*3/uL (ref 1.4–7.0)
Platelets: 298 10*3/uL (ref 150–379)
RBC: 4.63 x10E6/uL (ref 3.77–5.28)
RDW: 16.2 % — AB (ref 12.3–15.4)
WBC: 6.2 10*3/uL (ref 3.4–10.8)

## 2016-12-08 LAB — BASIC METABOLIC PANEL
BUN/Creatinine Ratio: 17 (ref 12–28)
BUN: 14 mg/dL (ref 8–27)
CALCIUM: 9.6 mg/dL (ref 8.7–10.3)
CO2: 27 mmol/L (ref 18–29)
CREATININE: 0.83 mg/dL (ref 0.57–1.00)
Chloride: 102 mmol/L (ref 96–106)
GFR calc Af Amer: 79 mL/min/{1.73_m2} (ref >59)
GFR, EST NON AFRICAN AMERICAN: 69 mL/min/{1.73_m2} (ref >59)
Glucose: 91 mg/dL (ref 65–99)
Potassium: 4.7 mmol/L (ref 3.5–5.2)
SODIUM: 140 mmol/L (ref 134–144)

## 2017-01-12 ENCOUNTER — Other Ambulatory Visit (HOSPITAL_COMMUNITY): Payer: Self-pay | Admitting: *Deleted

## 2017-01-12 MED ORDER — SOTALOL HCL 80 MG PO TABS: 80.0000 mg | ORAL_TABLET | Freq: Two times a day (BID) | ORAL | 2 refills | Status: DC

## 2017-01-14 ENCOUNTER — Ambulatory Visit (INDEPENDENT_AMBULATORY_CARE_PROVIDER_SITE_OTHER): Payer: Medicare Other | Admitting: Pharmacist

## 2017-01-14 DIAGNOSIS — I4891 Unspecified atrial fibrillation: Secondary | ICD-10-CM | POA: Diagnosis not present

## 2017-01-14 DIAGNOSIS — Z5181 Encounter for therapeutic drug level monitoring: Secondary | ICD-10-CM

## 2017-01-14 LAB — POCT INR: INR: 2.2

## 2017-02-09 ENCOUNTER — Other Ambulatory Visit: Payer: Self-pay | Admitting: Family Medicine

## 2017-02-09 DIAGNOSIS — I739 Peripheral vascular disease, unspecified: Secondary | ICD-10-CM

## 2017-02-11 ENCOUNTER — Ambulatory Visit
Admission: RE | Admit: 2017-02-11 | Discharge: 2017-02-11 | Disposition: A | Discharge status: Home / Self Care | Payer: Medicare Other | Source: Ambulatory Visit | Attending: Family Medicine | Admitting: Family Medicine

## 2017-02-11 DIAGNOSIS — I739 Peripheral vascular disease, unspecified: Secondary | ICD-10-CM

## 2017-02-25 ENCOUNTER — Ambulatory Visit (INDEPENDENT_AMBULATORY_CARE_PROVIDER_SITE_OTHER): Payer: Medicare Other | Admitting: Pharmacist

## 2017-02-25 DIAGNOSIS — I4891 Unspecified atrial fibrillation: Secondary | ICD-10-CM | POA: Diagnosis not present

## 2017-02-25 DIAGNOSIS — Z5181 Encounter for therapeutic drug level monitoring: Secondary | ICD-10-CM

## 2017-02-25 LAB — POCT INR: INR: 2

## 2017-03-04 ENCOUNTER — Other Ambulatory Visit: Payer: Self-pay | Admitting: Cardiology

## 2017-03-08 ENCOUNTER — Ambulatory Visit (HOSPITAL_COMMUNITY)
Admission: RE | Admit: 2017-03-08 | Discharge: 2017-03-08 | Disposition: A | Discharge status: Home / Self Care | Payer: Medicare Other | Source: Ambulatory Visit | Attending: Nurse Practitioner | Admitting: Nurse Practitioner

## 2017-03-08 ENCOUNTER — Encounter (HOSPITAL_COMMUNITY): Payer: Self-pay | Admitting: Nurse Practitioner

## 2017-03-08 ENCOUNTER — Telehealth: Payer: Self-pay

## 2017-03-08 VITALS — BP 118/82 | HR 60 | Ht 63.0 in | Wt 149.8 lb

## 2017-03-08 DIAGNOSIS — M810 Age-related osteoporosis without current pathological fracture: Secondary | ICD-10-CM | POA: Insufficient documentation

## 2017-03-08 DIAGNOSIS — Z8249 Family history of ischemic heart disease and other diseases of the circulatory system: Secondary | ICD-10-CM | POA: Insufficient documentation

## 2017-03-08 DIAGNOSIS — I4891 Unspecified atrial fibrillation: Secondary | ICD-10-CM | POA: Diagnosis not present

## 2017-03-08 DIAGNOSIS — Z87442 Personal history of urinary calculi: Secondary | ICD-10-CM | POA: Diagnosis not present

## 2017-03-08 DIAGNOSIS — Z7902 Long term (current) use of antithrombotics/antiplatelets: Secondary | ICD-10-CM | POA: Insufficient documentation

## 2017-03-08 DIAGNOSIS — K219 Gastro-esophageal reflux disease without esophagitis: Secondary | ICD-10-CM | POA: Diagnosis not present

## 2017-03-08 DIAGNOSIS — E7800 Pure hypercholesterolemia, unspecified: Secondary | ICD-10-CM | POA: Diagnosis not present

## 2017-03-08 DIAGNOSIS — Z9049 Acquired absence of other specified parts of digestive tract: Secondary | ICD-10-CM | POA: Insufficient documentation

## 2017-03-08 DIAGNOSIS — Z885 Allergy status to narcotic agent status: Secondary | ICD-10-CM | POA: Diagnosis not present

## 2017-03-08 DIAGNOSIS — I480 Paroxysmal atrial fibrillation: Secondary | ICD-10-CM | POA: Insufficient documentation

## 2017-03-08 DIAGNOSIS — I10 Essential (primary) hypertension: Secondary | ICD-10-CM | POA: Insufficient documentation

## 2017-03-08 DIAGNOSIS — Z950 Presence of cardiac pacemaker: Secondary | ICD-10-CM | POA: Diagnosis not present

## 2017-03-08 DIAGNOSIS — Z9889 Other specified postprocedural states: Secondary | ICD-10-CM | POA: Insufficient documentation

## 2017-03-08 DIAGNOSIS — Z801 Family history of malignant neoplasm of trachea, bronchus and lung: Secondary | ICD-10-CM | POA: Diagnosis not present

## 2017-03-08 DIAGNOSIS — Z808 Family history of malignant neoplasm of other organs or systems: Secondary | ICD-10-CM | POA: Insufficient documentation

## 2017-03-08 DIAGNOSIS — Z8041 Family history of malignant neoplasm of ovary: Secondary | ICD-10-CM | POA: Insufficient documentation

## 2017-03-08 LAB — BASIC METABOLIC PANEL
Anion gap: 5 (ref 5–15)
BUN: 15 mg/dL (ref 6–20)
CALCIUM: 9.7 mg/dL (ref 8.9–10.3)
CO2: 30 mmol/L (ref 22–32)
CREATININE: 0.98 mg/dL (ref 0.44–1.00)
Chloride: 103 mmol/L (ref 101–111)
GFR calc Af Amer: >60 mL/min (ref >60)
GFR calc non Af Amer: 54 mL/min — ABNORMAL LOW (ref >60)
GLUCOSE: 98 mg/dL (ref 65–99)
Potassium: 4.4 mmol/L (ref 3.5–5.1)
Sodium: 138 mmol/L (ref 135–145)

## 2017-03-08 LAB — MAGNESIUM: Magnesium: 2.2 mg/dL (ref 1.7–2.4)

## 2017-03-08 NOTE — Progress Notes (Signed)
Primary Care Physician: Carol Ada, MD Referring Physician: Dr. Lisette Abu Monica [name] is a 76 y.o. female with a h/o PPM implant 08/2016 and f/u initiation of sotalol. She is in  [place]. She has the app to follow her pacemaker but has not been explained how to work it. She has not noted any heart irregularity.  Today, she denies symptoms of palpitations, chest pain, shortness of breath, orthopnea, PND, lower extremity edema, dizziness, presyncope, syncope, or neurologic sequela. The patient is tolerating medications without difficulties and is otherwise without complaint today.   Past Medical History:  Diagnosis Date  . Adrenal adenoma 6/11   lt on ct 6/11  . GERD (gastroesophageal reflux disease)   . Hypercholesteremia   . Hypertension    [facility]  . Kidney stone 6/11   rt. on ct 6/11  . Normal nuclear stress test 2008   low risk  . Osteoporosis   . Paroxysmal atrial fibrillation ([place])    a. PVI 10/13 b. PVI 12/15   Past Surgical History:  Procedure Laterality Date  . ATRIAL FIBRILLATION ABLATION N/A 06/07/2012   PVI and CTI ablation by Dr Rayann Heman  . ATRIAL FIBRILLATION ABLATION N/A 07/31/2014   PVI by Dr Rayann Heman; initial ablation 2013  . CARDIOVERSION  05/06/2012   Procedure: CARDIOVERSION;  Surgeon: Sueanne Margarita, MD;  [facility];  Service: Cardiovascular;  Laterality: N/A;  h/p in file drawer  . CESAREAN SECTION  [phone]   x-2  . CHOLECYSTECTOMY  1989  . ELECTROPHYSIOLOGIC STUDY N/A 09/08/2016   Procedure: Cardioversion;  Surgeon: Thompson Grayer, MD;  Location: [place] [facility];  Service: Cardiovascular;  Laterality: N/A;  . EP IMPLANTABLE DEVICE N/A 09/08/2016   Procedure: Loop Recorder Removal;  Surgeon: Thompson Grayer, MD;  Location: [place] [facility];  Service: Cardiovascular;  Laterality: N/A;  . EP IMPLANTABLE DEVICE N/A 09/08/2016   Procedure: Pacemaker Implant;  Surgeon: Thompson Grayer, MD;  Location: [place] [facility];  Service: Cardiovascular;   Laterality: N/A;  . Implantable loop recorder implantation  05/30/14   Medtronic Reveal LINQ implanted by Dr Rayann Heman with RIO II protocol (ambulatory setting)  . TEE WITHOUT CARDIOVERSION  06/06/2012   Procedure: TRANSESOPHAGEAL ECHOCARDIOGRAM (TEE);  Surgeon: Jettie Booze, MD;  Location: [place];  Service: Cardiovascular;  Laterality: N/A;  . TEE WITHOUT CARDIOVERSION N/A 07/30/2014   Procedure: TRANSESOPHAGEAL ECHOCARDIOGRAM (TEE);  Surgeon: Larey Dresser, MD;  Location: [facility] ENDOSCOPY;  Service: Cardiovascular;  Laterality: N/A;    Current Outpatient Prescriptions  Medication Sig Dispense Refill  . acetaminophen (TYLENOL) 500 MG tablet Take 500 mg by mouth every 6 (six) hours as needed. For pain    . calcium carbonate (OS-CAL) 600 MG TABS tablet Take 600 mg by mouth 2 (two) times daily with a meal.     . cholecalciferol (VITAMIN D) 1000 UNITS tablet Take 1,000 Units by mouth every evening.     Marland Kitchen losartan (COZAAR) 100 MG tablet Take 1 tablet (100 mg total) by mouth daily. 90 tablet 3  . metoprolol tartrate (LOPRESSOR) 25 MG tablet TAKE 1 & 1/2 TABLETS BY MOUTH TWICE A DAY 270 tablet 2  . pantoprazole (PROTONIX) 40 MG tablet Take 40 mg by mouth daily.    . pravastatin (PRAVACHOL) 20 MG tablet Take 20 mg by mouth daily.  1  . sotalol (BETAPACE) 80 MG tablet Take 1 tablet (80 mg total) by mouth 2 (two) times daily. 180 tablet 2  . traZODone (DESYREL) 50  MG tablet Take 25-50 mg by mouth at bedtime as needed for sleep.     Marland Kitchen warfarin (COUMADIN) 5 MG tablet TAKE AS DIRECTED BY THE COUMADIN CLINIC 30 tablet 3   No current facility-administered medications for this encounter.     Allergies  Allergen Reactions  . Morphine And Related Nausea And Vomiting  . Codeine Nausea And Vomiting    Social History   Social History  . Marital status: Widowed    Spouse name: N/A  . Number of children: N/A  . Years of education: N/A   Occupational History  . Not on file.   Social  History Main Topics  . Smoking status: Never Smoker  . Smokeless tobacco: Never Used  . Alcohol use No  . Drug use: No  . Sexual activity: Not on file   Other Topics Concern  . 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   Monica [name]   Marital Status:single,widowed,2006.    Family History  Problem Relation Age of Onset  . Heart attack Father   . CAD Father   . Hip fracture Mother        complication  . Lung cancer Brother   . Heart attack Brother        massive  . Other Brother        mva  . Thyroid cancer Sister   . Atrial fibrillation Sister   . Ovarian cancer Sister        surviver    ROS- All systems are reviewed and negative except as per the HPI above  Physical Exam: Vitals:   03/08/17 1100  BP: 118/82  Pulse: 60  Weight: 149 lb 12.8 oz (67.9 kg)  Height: 5\' 3"  (1.6 m)   Wt Readings from Last 3 Encounters:  03/08/17 149 lb 12.8 oz (67.9 kg)  12/07/16 156 lb 9.6 oz (71 kg)  09/21/16 153 lb 3.2 oz (69.5 kg)    Labs: Lab Results  Component Value Date   NA 138 03/08/2017   K 4.4 03/08/2017   CL 103 03/08/2017   CO2 30 03/08/2017   GLUCOSE 98 03/08/2017   BUN 15 03/08/2017   CREATININE 0.98 03/08/2017   CALCIUM 9.7 03/08/2017   MG 2.2 03/08/2017   Lab Results  Component Value Date   INR 2.0 02/25/2017   No results found for: CHOL, HDL, LDLCALC, TRIG   GEN- The patient is well appearing, alert and oriented x 3 today.   Head- normocephalic, atraumatic Eyes-  Sclera clear, conjunctiva pink Ears- hearing intact Oropharynx- clear Neck- supple, no JVP Lymph- no cervical lymphadenopathy Lungs- Clear to ausculation bilaterally, normal work of breathing Heart- Regular rate and rhythm, no murmurs, rubs or gallops, PMI not laterally displaced GI- soft, NT, ND, + BS Extremities- no clubbing, cyanosis, or edema MS- no significant deformity or atrophy Skin- no rash or  lesion Psych- euthymic mood, full affect Neuro- strength and sensation are intact  EKG-atrial paced rhythm with prolonged av conduction pr int 246 ms, qrs int 76 ms, qtc 436 ms Epic records reviewed    Assessment and Plan: 1. Afib  As far as pt knows, she has been staying in [place] clinic will be messaged to contact her to discuss how to use the Medtronic app for her device Continue sotalol 80 mg bid  Continue metoprolol bid Bmet/mag today Continue warfarin for a chadsvasc score of at least 4  F/u with Dr.  Allred in October as planned  afib clinic as needed   [place]. Kathrina Crosley, [facility] [address] [place], [place] [address] [phone]

## 2017-03-08 NOTE — Telephone Encounter (Signed)
Spoke with patient and walked her through how to send a remote transmission. She received a [id number] error code so I gave her the tech services number to better assist her. She verbalized understanding.

## 2017-03-16 ENCOUNTER — Ambulatory Visit (HOSPITAL_COMMUNITY): Payer: Medicare Other | Admitting: Nurse Practitioner

## 2017-04-08 ENCOUNTER — Ambulatory Visit (INDEPENDENT_AMBULATORY_CARE_PROVIDER_SITE_OTHER): Payer: Medicare Other | Admitting: *Deleted

## 2017-04-08 DIAGNOSIS — Z5181 Encounter for therapeutic drug level monitoring: Secondary | ICD-10-CM

## 2017-04-08 DIAGNOSIS — I4891 Unspecified atrial fibrillation: Secondary | ICD-10-CM | POA: Diagnosis not present

## 2017-04-08 LAB — POCT INR: INR: 2.5

## 2017-05-20 ENCOUNTER — Ambulatory Visit (INDEPENDENT_AMBULATORY_CARE_PROVIDER_SITE_OTHER): Payer: Medicare Other | Admitting: *Deleted

## 2017-05-20 DIAGNOSIS — I4891 Unspecified atrial fibrillation: Secondary | ICD-10-CM

## 2017-05-20 DIAGNOSIS — Z5181 Encounter for therapeutic drug level monitoring: Secondary | ICD-10-CM

## 2017-05-20 LAB — POCT INR: INR: 2.5

## 2017-06-06 ENCOUNTER — Other Ambulatory Visit: Payer: Self-pay | Admitting: Internal Medicine

## 2017-06-07 ENCOUNTER — Ambulatory Visit (INDEPENDENT_AMBULATORY_CARE_PROVIDER_SITE_OTHER): Payer: Medicare Other | Admitting: Internal Medicine

## 2017-06-07 ENCOUNTER — Encounter: Payer: Self-pay | Admitting: Internal Medicine

## 2017-06-07 VITALS — BP 190/90 | HR 60 | Ht 63.0 in | Wt 149.0 lb

## 2017-06-07 DIAGNOSIS — R001 Bradycardia, unspecified: Secondary | ICD-10-CM

## 2017-06-07 DIAGNOSIS — I480 Paroxysmal atrial fibrillation: Secondary | ICD-10-CM | POA: Diagnosis not present

## 2017-06-07 DIAGNOSIS — I10 Essential (primary) hypertension: Secondary | ICD-10-CM

## 2017-06-07 LAB — CUP PACEART INCLINIC DEVICE CHECK
Brady Statistic AP VP Percent: 0.05 %
Brady Statistic AS VP Percent: 0.65 %
Brady Statistic AS VS Percent: 41.44 %
Date Time Interrogation Session: 20181022112935
Implantable Lead Implant Date: 20180123
Implantable Lead Model: 5076
Lead Channel Impedance Value: 342 Ohm
Lead Channel Impedance Value: 437 Ohm
Lead Channel Impedance Value: 475 Ohm
Lead Channel Pacing Threshold Amplitude: 0.625 V
Lead Channel Pacing Threshold Amplitude: 0.75 V
Lead Channel Pacing Threshold Pulse Width: 0.4 ms
Lead Channel Sensing Intrinsic Amplitude: 2.375 mV
Lead Channel Sensing Intrinsic Amplitude: 2.5 mV
Lead Channel Sensing Intrinsic Amplitude: 8.75 mV
Lead Channel Setting Pacing Amplitude: 2 V
Lead Channel Setting Pacing Pulse Width: 0.4 ms
MDC IDC LEAD IMPLANT DT: 20180123
MDC IDC LEAD LOCATION: 753859
MDC IDC LEAD LOCATION: 753860
MDC IDC MSMT BATTERY REMAINING LONGEVITY: 112 mo
MDC IDC MSMT BATTERY VOLTAGE: 3.02 V
MDC IDC MSMT LEADCHNL RA PACING THRESHOLD PULSEWIDTH: 0.4 ms
MDC IDC MSMT LEADCHNL RV IMPEDANCE VALUE: 608 Ohm
MDC IDC MSMT LEADCHNL RV SENSING INTR AMPL: 8.25 mV
MDC IDC PG IMPLANT DT: 20180123
MDC IDC SET LEADCHNL RV PACING AMPLITUDE: 2.5 V
MDC IDC SET LEADCHNL RV SENSING SENSITIVITY: 2 mV
MDC IDC STAT BRADY AP VS PERCENT: 57.86 %
MDC IDC STAT BRADY RA PERCENT PACED: 57.07 %
MDC IDC STAT BRADY RV PERCENT PACED: 0.7 %

## 2017-06-07 MED ORDER — AMLODIPINE BESYLATE 2.5 MG PO TABS: 2.5000 mg | ORAL_TABLET | Freq: Every day | ORAL | 3 refills | Status: DC

## 2017-06-07 NOTE — Progress Notes (Signed)
PCP: Carol Ada, MD   Primary EP:  Dr Kittie Plater is a 76 y.o. female who presents today for routine electrophysiology followup.  Since last being seen in our clinic, the patient reports doing very well.  She is mostly unaware of afib.  Bp is high at times.  Today, she denies symptoms of palpitations, chest pain, shortness of breath,  lower extremity edema, dizziness, presyncope, or syncope.  The patient is otherwise without complaint today.   Past Medical History:  Diagnosis Date  . Adrenal adenoma 6/11   lt on ct 6/11  . GERD (gastroesophageal reflux disease)   . Hypercholesteremia   . Hypertension    [facility]  . Kidney stone 6/11   rt. on ct 6/11  . Normal nuclear stress test 2008   low risk  . Osteoporosis   . Paroxysmal atrial fibrillation ([place])    a. PVI 10/13 b. PVI 12/15   Past Surgical History:  Procedure Laterality Date  . ATRIAL FIBRILLATION ABLATION N/A 06/07/2012   PVI and CTI ablation by Dr Rayann Heman  . ATRIAL FIBRILLATION ABLATION N/A 07/31/2014   PVI by Dr Rayann Heman; initial ablation 2013  . CARDIOVERSION  05/06/2012   Procedure: CARDIOVERSION;  Surgeon: Sueanne Margarita, MD;  [facility];  Service: Cardiovascular;  Laterality: N/A;  h/p in file drawer  . CESAREAN SECTION  [phone]   x-2  . CHOLECYSTECTOMY  1989  . ELECTROPHYSIOLOGIC STUDY N/A 09/08/2016   Procedure: Cardioversion;  Surgeon: Thompson Grayer, MD;  Location: [place] [facility];  Service: Cardiovascular;  Laterality: N/A;  . EP IMPLANTABLE DEVICE N/A 09/08/2016   Procedure: Loop Recorder Removal;  Surgeon: Thompson Grayer, MD;  Location: [place] [facility];  Service: Cardiovascular;  Laterality: N/A;  . EP IMPLANTABLE DEVICE N/A 09/08/2016   Procedure: Pacemaker Implant;  Surgeon: Thompson Grayer, MD;  Location: [place] [facility];  Service: Cardiovascular;  Laterality: N/A;  . Implantable loop recorder implantation  05/30/14   Medtronic Reveal LINQ implanted by Dr Rayann Heman with RIO II  protocol (ambulatory setting)  . TEE WITHOUT CARDIOVERSION  06/06/2012   Procedure: TRANSESOPHAGEAL ECHOCARDIOGRAM (TEE);  Surgeon: Jettie Booze, MD;  Location: [place];  Service: Cardiovascular;  Laterality: N/A;  . TEE WITHOUT CARDIOVERSION N/A 07/30/2014   Procedure: TRANSESOPHAGEAL ECHOCARDIOGRAM (TEE);  Surgeon: Larey Dresser, MD;  Location: [place];  Service: Cardiovascular;  Laterality: N/A;    ROS- all systems are reviewed and negative except as per HPI above  Current Outpatient Prescriptions  Medication Sig Dispense Refill  . acetaminophen (TYLENOL) 500 MG tablet Take 500 mg by mouth every 6 (six) hours as needed. For pain    . calcium carbonate (OS-CAL) 600 MG TABS tablet Take 600 mg by mouth 2 (two) times daily with a meal.     . cholecalciferol (VITAMIN D) 1000 UNITS tablet Take 1,000 Units by mouth every evening.     Marland Kitchen losartan (COZAAR) 100 MG tablet Take 1 tablet (100 mg total) by mouth daily. 90 tablet 3  . metoprolol tartrate (LOPRESSOR) 25 MG tablet TAKE 1 & 1/2 TABLETS BY MOUTH TWICE A DAY 270 tablet 2  . pantoprazole (PROTONIX) 40 MG tablet Take 40 mg by mouth daily.    . pravastatin (PRAVACHOL) 20 MG tablet Take 20 mg by mouth daily.  1  . sotalol (BETAPACE) 80 MG tablet Take 1 tablet (80 mg total) by mouth 2 (two) times daily. 180 tablet 2  . traZODone (DESYREL) 50  MG tablet Take 25-50 mg by mouth at bedtime as needed for sleep.     Marland Kitchen warfarin (COUMADIN) 5 MG tablet TAKE AS DIRECTED BY THE COUMADIN CLINIC 30 tablet 3   No current facility-administered medications for this visit.     Physical Exam: Vitals:   06/07/17 1041  BP: (!) 190/90  Pulse: 60  SpO2: 98%  Weight: 149 lb (67.6 kg)  Height: 5\' 3"  (1.6 m)    GEN- The patient is well appearing, alert and oriented x 3 today.   Head- normocephalic, atraumatic Eyes-  Sclera clear, conjunctiva pink Ears- hearing intact Oropharynx- clear Lungs- Clear to ausculation bilaterally, normal work  of breathing Chest- pacemaker pocket is well healed Heart- Regular rate and rhythm, no murmurs, rubs or gallops, PMI not laterally displaced GI- soft, NT, ND, + BS Extremities- no clubbing, cyanosis, or edema  Pacemaker interrogation- reviewed in detail today,  See PACEART report   EKG tracing ordered today is personally reviewed and shows atrial paced rhythm 60 bpm, LAD  Assessment and Plan:  1. Symptomatic sinus bradycardia  Normal pacemaker function See Pace Art report No changes today  2. afib Doing well s/p initiation of tikosyn with stable qt afib burden 2.7 % (previuosly 1.3%) Consider increasing sotalol to 120mg  BID if needed Continue on anticoagulation for chads2vasc score of 3.  3. HTN Very elevated today Will add norvasc 2.5mg  daily.  If BP > 150/90, she can take 5mg  daily Avoid salt   carelink Return to see EP NP in 6 months  Thompson Grayer MD, [facility] 06/07/2017 11:10 AM

## 2017-06-07 NOTE — Patient Instructions (Addendum)
Medication Instructions:  Your physician has recommended you make the following change in your medication:   1.)  Start Norvasc (Amlodipine) 2.5mg  daily.  Take an additional 2.5mg  if BP is greater than 150/90.   -- If you need a refill on your cardiac medications before your next appointment, please call your pharmacy. --  Labwork: None ordered  Testing/Procedures: None ordered  Follow-Up: Your physician wants you to follow-up in: 6 months with Chanetta Marshall NP  You will receive a reminder letter in the mail two months in advance. If you don't receive a letter, please call our office to schedule the follow-up appointment.  Remote monitoring is used to monitor your Pacemaker  from home. This monitoring reduces the number of office visits required to check your device to one time per year. It allows [place] to keep an eye on the functioning of your device to ensure it is working properly. You are scheduled for a device check from home on 09/07/2107. You may send your transmission at any time that day. If you have a wireless device, the transmission will be sent automatically. After your physician reviews your transmission, you will receive a postcard with your next transmission date.      Thank you for choosing [facility]!!   [phone]  Any Other Special Instructions Will Be Listed Below (If Applicable).

## 2017-07-01 ENCOUNTER — Ambulatory Visit (INDEPENDENT_AMBULATORY_CARE_PROVIDER_SITE_OTHER): Payer: Medicare Other | Admitting: *Deleted

## 2017-07-01 DIAGNOSIS — I480 Paroxysmal atrial fibrillation: Secondary | ICD-10-CM | POA: Diagnosis not present

## 2017-07-01 DIAGNOSIS — I4891 Unspecified atrial fibrillation: Secondary | ICD-10-CM

## 2017-07-01 DIAGNOSIS — Z5181 Encounter for therapeutic drug level monitoring: Secondary | ICD-10-CM

## 2017-07-01 LAB — POCT INR: INR: 2.8

## 2017-07-01 NOTE — Patient Instructions (Signed)
Continue taking 1/2 tablet daily.  Recheck INR in 6 weeks.

## 2017-08-19 ENCOUNTER — Ambulatory Visit (INDEPENDENT_AMBULATORY_CARE_PROVIDER_SITE_OTHER): Payer: Medicare Other | Admitting: *Deleted

## 2017-08-19 DIAGNOSIS — I480 Paroxysmal atrial fibrillation: Secondary | ICD-10-CM | POA: Diagnosis not present

## 2017-08-19 DIAGNOSIS — Z5181 Encounter for therapeutic drug level monitoring: Secondary | ICD-10-CM | POA: Diagnosis not present

## 2017-08-19 DIAGNOSIS — I4891 Unspecified atrial fibrillation: Secondary | ICD-10-CM

## 2017-08-19 LAB — POCT INR: INR: 1.9

## 2017-08-19 NOTE — Patient Instructions (Signed)
Description   Today January 3rd take 1 tablet (5mg ) then continue taking 1/2 tablet (2.5mg )  daily.  Recheck INR in 6 weeks.

## 2017-09-06 ENCOUNTER — Ambulatory Visit (INDEPENDENT_AMBULATORY_CARE_PROVIDER_SITE_OTHER): Payer: Medicare Other | Admitting: *Deleted

## 2017-09-06 DIAGNOSIS — R001 Bradycardia, unspecified: Secondary | ICD-10-CM

## 2017-09-06 NOTE — Progress Notes (Signed)
Remote pacemaker transmission.   

## 2017-09-07 ENCOUNTER — Encounter: Payer: Self-pay | Admitting: Cardiology

## 2017-09-07 LAB — CUP PACEART REMOTE DEVICE CHECK
Battery Remaining Longevity: 106 mo
Battery Voltage: 3.02 V
Brady Statistic AP VP Percent: 0.08 %
Brady Statistic AS VS Percent: 33.67 %
Brady Statistic RV Percent Paced: 0.51 %
Date Time Interrogation Session: 20190121161338
Implantable Lead Implant Date: 20180123
Implantable Lead Location: 753859
Implantable Lead Model: 5076
Implantable Pulse Generator Implant Date: 20180123
Lead Channel Impedance Value: 342 Ohm
Lead Channel Impedance Value: 456 Ohm
Lead Channel Impedance Value: 494 Ohm
Lead Channel Impedance Value: 646 Ohm
Lead Channel Pacing Threshold Amplitude: 0.625 V
Lead Channel Pacing Threshold Amplitude: 0.875 V
Lead Channel Pacing Threshold Pulse Width: 0.4 ms
Lead Channel Sensing Intrinsic Amplitude: 2 mV
Lead Channel Setting Pacing Amplitude: 2 V
Lead Channel Setting Pacing Amplitude: 2.5 V
Lead Channel Setting Sensing Sensitivity: 2 mV
MDC IDC LEAD IMPLANT DT: 20180123
MDC IDC LEAD LOCATION: 753860
MDC IDC MSMT LEADCHNL RA PACING THRESHOLD PULSEWIDTH: 0.4 ms
MDC IDC MSMT LEADCHNL RA SENSING INTR AMPL: 2 mV
MDC IDC MSMT LEADCHNL RV SENSING INTR AMPL: 8.75 mV
MDC IDC MSMT LEADCHNL RV SENSING INTR AMPL: 8.75 mV
MDC IDC SET LEADCHNL RV PACING PULSEWIDTH: 0.4 ms
MDC IDC STAT BRADY AP VS PERCENT: 65.82 %
MDC IDC STAT BRADY AS VP PERCENT: 0.44 %
MDC IDC STAT BRADY RA PERCENT PACED: 65.15 %

## 2017-09-23 ENCOUNTER — Other Ambulatory Visit: Payer: Self-pay | Admitting: Cardiology

## 2017-09-29 ENCOUNTER — Ambulatory Visit (HOSPITAL_COMMUNITY)
Admission: RE | Admit: 2017-09-29 | Discharge: 2017-09-29 | Disposition: A | Discharge status: Home / Self Care | Payer: Medicare Other | Source: Ambulatory Visit | Attending: Nurse Practitioner | Admitting: Nurse Practitioner

## 2017-09-29 ENCOUNTER — Encounter (HOSPITAL_COMMUNITY): Payer: Self-pay | Admitting: Nurse Practitioner

## 2017-09-29 VITALS — BP 126/82 | HR 98 | Ht 63.0 in | Wt 152.0 lb

## 2017-09-29 DIAGNOSIS — K219 Gastro-esophageal reflux disease without esophagitis: Secondary | ICD-10-CM | POA: Diagnosis not present

## 2017-09-29 DIAGNOSIS — Z885 Allergy status to narcotic agent status: Secondary | ICD-10-CM | POA: Insufficient documentation

## 2017-09-29 DIAGNOSIS — I480 Paroxysmal atrial fibrillation: Secondary | ICD-10-CM | POA: Diagnosis not present

## 2017-09-29 DIAGNOSIS — Z79899 Other long term (current) drug therapy: Secondary | ICD-10-CM | POA: Diagnosis not present

## 2017-09-29 DIAGNOSIS — Z7901 Long term (current) use of anticoagulants: Secondary | ICD-10-CM | POA: Insufficient documentation

## 2017-09-29 DIAGNOSIS — I10 Essential (primary) hypertension: Secondary | ICD-10-CM | POA: Insufficient documentation

## 2017-09-29 LAB — MAGNESIUM: Magnesium: 2.2 mg/dL (ref 1.7–2.4)

## 2017-09-29 LAB — CBC
HEMATOCRIT: 41.2 % (ref 36.0–46.0)
HEMOGLOBIN: 13.1 g/dL (ref 12.0–15.0)
MCH: 27.2 pg (ref 26.0–34.0)
MCHC: 31.8 g/dL (ref 30.0–36.0)
MCV: 85.7 fL (ref 78.0–100.0)
Platelets: 336 10*3/uL (ref 150–400)
RBC: 4.81 MIL/uL (ref 3.87–5.11)
RDW: 15 % (ref 11.5–15.5)
WBC: 8.1 10*3/uL (ref 4.0–10.5)

## 2017-09-29 LAB — BASIC METABOLIC PANEL
ANION GAP: 9 (ref 5–15)
BUN: 19 mg/dL (ref 6–20)
CALCIUM: 9.3 mg/dL (ref 8.9–10.3)
CO2: 25 mmol/L (ref 22–32)
Chloride: 103 mmol/L (ref 101–111)
Creatinine, Ser: 1.04 mg/dL — ABNORMAL HIGH (ref 0.44–1.00)
GFR, EST AFRICAN AMERICAN: 59 mL/min — AB (ref >60)
GFR, EST NON AFRICAN AMERICAN: 50 mL/min — AB (ref >60)
Glucose, Bld: 105 mg/dL — ABNORMAL HIGH (ref 65–99)
POTASSIUM: 4.9 mmol/L (ref 3.5–5.1)
Sodium: 137 mmol/L (ref 135–145)

## 2017-09-29 MED ORDER — LOSARTAN POTASSIUM 100 MG PO TABS: 50.0000 mg | ORAL_TABLET | Freq: Every day | ORAL | 3 refills | Status: DC

## 2017-09-29 NOTE — Patient Instructions (Addendum)
Cardioversion scheduled for Wednesday, February 20th  -have your INR checked in the coumadin clinic prior to 1030 arrival  - Arrive at the [facility] and go to admitting at 1030am  -Do not eat or drink anything after midnight the night prior to your procedure.  - Take all your medication with a sip of water prior to arrival.  - You will not be able to drive home after your procedure.  Your physician has recommended you make the following change in your medication: 1)Decrease losartan to 1/2 tablet at bedtime 2)Move amlodipine to lunch time

## 2017-09-29 NOTE — Progress Notes (Signed)
Primary Care Physician: Carol Ada, MD Referring Physician: Dr. Lisette Abu Monica [name] is a 77 y.o. female with a h/o ablation x 2,PPM implant 08/2016, paroxysmal afib on sotalol. She has noted afib since Monday as well as low BPin the am, this am 86 systolic, and is being seen as an urgent visit. Her BP was elevated when she was seen by Dr.Allred last fall and amlodipine 2.5 mg daily was added. She takes this and losartan 100 mg  at hs. No known triggers.  Today, she denies symptoms of  chest pain, shortness of breath, orthopnea, PND, lower extremity edema, dizziness, presyncope, syncope, or neurologic sequela. The patient is tolerating medications without difficulties and is otherwise without complaint today.   Past Medical History:  Diagnosis Date  . Adrenal adenoma 6/11   lt on ct 6/11  . GERD (gastroesophageal reflux disease)   . Hypercholesteremia   . Hypertension    [facility]  . Kidney stone 6/11   rt. on ct 6/11  . Normal nuclear stress test 2008   low risk  . Osteoporosis   . Paroxysmal atrial fibrillation ([place])    a. PVI 10/13 b. PVI 12/15   Past Surgical History:  Procedure Laterality Date  . ATRIAL FIBRILLATION ABLATION N/A 06/07/2012   PVI and CTI ablation by Dr Rayann Heman  . ATRIAL FIBRILLATION ABLATION N/A 07/31/2014   PVI by Dr Rayann Heman; initial ablation 2013  . CARDIOVERSION  05/06/2012   Procedure: CARDIOVERSION;  Surgeon: Sueanne Margarita, MD;  [facility];  Service: Cardiovascular;  Laterality: N/A;  h/p in file drawer  . CESAREAN SECTION  [phone]   x-2  . CHOLECYSTECTOMY  1989  . ELECTROPHYSIOLOGIC STUDY N/A 09/08/2016   Procedure: Cardioversion;  Surgeon: Thompson Grayer, MD;  Location: [place] [facility];  Service: Cardiovascular;  Laterality: N/A;  . EP IMPLANTABLE DEVICE N/A 09/08/2016   Procedure: Loop Recorder Removal;  Surgeon: Thompson Grayer, MD;  Location: [place] [facility];  Service: Cardiovascular;  Laterality: N/A;  . EP IMPLANTABLE DEVICE  N/A 09/08/2016   Procedure: Pacemaker Implant;  Surgeon: Thompson Grayer, MD;  Location: [place] [facility];  Service: Cardiovascular;  Laterality: N/A;  . Implantable loop recorder implantation  05/30/14   Medtronic Reveal LINQ implanted by Dr Rayann Heman with RIO II protocol (ambulatory setting)  . TEE WITHOUT CARDIOVERSION  06/06/2012   Procedure: TRANSESOPHAGEAL ECHOCARDIOGRAM (TEE);  Surgeon: Jettie Booze, MD;  Location: [place];  Service: Cardiovascular;  Laterality: N/A;  . TEE WITHOUT CARDIOVERSION N/A 07/30/2014   Procedure: TRANSESOPHAGEAL ECHOCARDIOGRAM (TEE);  Surgeon: Larey Dresser, MD;  Location: [facility];  Service: Cardiovascular;  Laterality: N/A;    Current Outpatient Medications  Medication Sig Dispense Refill  . acetaminophen (TYLENOL) 500 MG tablet Take 500 mg by mouth every 6 (six) hours as needed. For pain    . amLODipine (NORVASC) 2.5 MG tablet Take 1 tablet (2.5 mg total) by mouth daily. 135 tablet 3  . calcium carbonate (OS-CAL) 600 MG TABS tablet Take 600 mg by mouth 2 (two) times daily with a meal.     . cholecalciferol (VITAMIN D) 1000 UNITS tablet Take 1,000 Units by mouth every evening.     Marland Kitchen losartan (COZAAR) 100 MG tablet Take 0.5 tablets (50 mg total) by mouth daily. 90 tablet 3  . metoprolol tartrate (LOPRESSOR) 25 MG tablet TAKE 1 & 1/2 TABLETS BY MOUTH TWICE A DAY 270 tablet 2  . pantoprazole (PROTONIX) 40 MG tablet Take 40  mg by mouth daily.    . pravastatin (PRAVACHOL) 20 MG tablet Take 20 mg by mouth daily.  1  . sotalol (BETAPACE) 80 MG tablet Take 1 tablet (80 mg total) by mouth 2 (two) times daily. 180 tablet 2  . traZODone (DESYREL) 50 MG tablet Take 25-50 mg by mouth at bedtime as needed for sleep.     Marland Kitchen warfarin (COUMADIN) 5 MG tablet TAKE AS DIRECTED BY THE COUMADIN CLINIC 30 tablet 3   No current facility-administered medications for this encounter.     Allergies  Allergen Reactions  . Morphine And Related Nausea And Vomiting  .  Codeine Nausea And Vomiting    Social History   Socioeconomic History  . Marital status: Widowed    Spouse name: Not on file  . Number of children: Not on file  . Years of education: Not on file  . Highest education level: Not on file  Social Needs  . Financial resource strain: Not on file  . Food insecurity - worry: Not on file  . Food insecurity - inability: Not on file  . Transportation needs - medical: Not on file  . Transportation needs - non-medical: Not on file  Occupational History  . Not on file  Tobacco Use  . Smoking status: Never Smoker  . Smokeless tobacco: Never Used  Substance and Sexual Activity  . Alcohol use: No  . Drug use: No  . Sexual activity: Not on file  Other Topics Concern  . 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   [place]   Marital Status:single,widowed,2006.    Family History  Problem Relation Age of Onset  . Heart attack Father   . CAD Father   . Hip fracture Mother        complication  . Lung cancer Brother   . Heart attack Brother        massive  . Other Brother        mva  . Thyroid cancer Sister   . Atrial fibrillation Sister   . Ovarian cancer Sister        surviver    ROS- All systems are reviewed and negative except as per the HPI above  Physical Exam: Vitals:   09/29/17 1401  BP: 126/82  Pulse: 98  Weight: 152 lb (68.9 kg)  Height: 5\' 3"  (1.6 m)   Wt Readings from Last 3 Encounters:  09/29/17 152 lb (68.9 kg)  06/07/17 149 lb (67.6 kg)  03/08/17 149 lb 12.8 oz (67.9 kg)    Labs: Lab Results  Component Value Date   NA 137 09/29/2017   K 4.9 09/29/2017   CL 103 09/29/2017   CO2 25 09/29/2017   GLUCOSE 105 (H) 09/29/2017   BUN 19 09/29/2017   CREATININE 1.04 (H) 09/29/2017   CALCIUM 9.3 09/29/2017   MG 2.2 09/29/2017   Lab Results  Component Value Date   INR 1.9 08/19/2017   No results found for: CHOL, HDL, LDLCALC,  TRIG   GEN- The patient is well appearing, alert and oriented x 3 today.   Head- normocephalic, atraumatic Eyes-  Sclera clear, conjunctiva pink Ears- hearing intact Oropharynx- clear Neck- supple, no JVP Lymph- no cervical lymphadenopathy Lungs- Clear to ausculation bilaterally, normal work of breathing Heart- irregular rate and rhythm, no murmurs, rubs or gallops, PMI not laterally displaced GI- soft, NT, ND, + BS Extremities- no clubbing, cyanosis, or edema MS- no significant deformity  or atrophy Skin- no rash or lesion Psych- euthymic mood, full affect Neuro- strength and sensation are intact  EKG- afib at 98 bpm, qrs int 72 bpm, qtc 454 ms Epic records reviewed    Assessment and Plan: 1. Afib  On sotalol and had experienced very little afib burden but went into afib Monday am Will schedule Tee guided cardioversion for next week Continue sotalol 80 mg bid  Continue metoprolol bid Bmet/mag/cbc today INR pending tomorrow in coumadin clinc Continue warfarin for a chadsvasc score of at least 4  F/u one week after cardioversion   Butch Penny C. Dalyla Chui, [facility] [address] [place], [place] [address] [phone]

## 2017-09-29 NOTE — H&P (View-Only) (Signed)
Primary Care Physician: Carol Ada, MD Referring Physician: Dr. Lisette Abu Monica [name] is a 77 y.o. female with a h/o ablation x 2,PPM implant 08/2016, paroxysmal afib on sotalol. She has noted afib since Monday as well as low BPin the am, this am 86 systolic, and is being seen as an urgent visit. Her BP was elevated when she was seen by Dr.Allred last fall and amlodipine 2.5 mg daily was added. She takes this and losartan 100 mg  at hs. No known triggers.  Today, she denies symptoms of  chest pain, shortness of breath, orthopnea, PND, lower extremity edema, dizziness, presyncope, syncope, or neurologic sequela. The patient is tolerating medications without difficulties and is otherwise without complaint today.   Past Medical History:  Diagnosis Date  . Adrenal adenoma 6/11   lt on ct 6/11  . GERD (gastroesophageal reflux disease)   . Hypercholesteremia   . Hypertension    [facility]  . Kidney stone 6/11   rt. on ct 6/11  . Normal nuclear stress test 2008   low risk  . Osteoporosis   . Paroxysmal atrial fibrillation ([place])    a. PVI 10/13 b. PVI 12/15   Past Surgical History:  Procedure Laterality Date  . ATRIAL FIBRILLATION ABLATION N/A 06/07/2012   PVI and CTI ablation by Dr Rayann Heman  . ATRIAL FIBRILLATION ABLATION N/A 07/31/2014   PVI by Dr Rayann Heman; initial ablation 2013  . CARDIOVERSION  05/06/2012   Procedure: CARDIOVERSION;  Surgeon: Sueanne Margarita, MD;  [facility];  Service: Cardiovascular;  Laterality: N/A;  h/p in file drawer  . CESAREAN SECTION  [phone]   x-2  . CHOLECYSTECTOMY  1989  . ELECTROPHYSIOLOGIC STUDY N/A 09/08/2016   Procedure: Cardioversion;  Surgeon: Thompson Grayer, MD;  Location: [place] [facility];  Service: Cardiovascular;  Laterality: N/A;  . EP IMPLANTABLE DEVICE N/A 09/08/2016   Procedure: Loop Recorder Removal;  Surgeon: Thompson Grayer, MD;  Location: [place] [facility];  Service: Cardiovascular;  Laterality: N/A;  . EP IMPLANTABLE DEVICE  N/A 09/08/2016   Procedure: Pacemaker Implant;  Surgeon: Thompson Grayer, MD;  Location: [place] [facility];  Service: Cardiovascular;  Laterality: N/A;  . Implantable loop recorder implantation  05/30/14   Medtronic Reveal LINQ implanted by Dr Rayann Heman with RIO II protocol (ambulatory setting)  . TEE WITHOUT CARDIOVERSION  06/06/2012   Procedure: TRANSESOPHAGEAL ECHOCARDIOGRAM (TEE);  Surgeon: Jettie Booze, MD;  Location: [place];  Service: Cardiovascular;  Laterality: N/A;  . TEE WITHOUT CARDIOVERSION N/A 07/30/2014   Procedure: TRANSESOPHAGEAL ECHOCARDIOGRAM (TEE);  Surgeon: Larey Dresser, MD;  Location: [facility];  Service: Cardiovascular;  Laterality: N/A;    Current Outpatient Medications  Medication Sig Dispense Refill  . acetaminophen (TYLENOL) 500 MG tablet Take 500 mg by mouth every 6 (six) hours as needed. For pain    . amLODipine (NORVASC) 2.5 MG tablet Take 1 tablet (2.5 mg total) by mouth daily. 135 tablet 3  . calcium carbonate (OS-CAL) 600 MG TABS tablet Take 600 mg by mouth 2 (two) times daily with a meal.     . cholecalciferol (VITAMIN D) 1000 UNITS tablet Take 1,000 Units by mouth every evening.     Marland Kitchen losartan (COZAAR) 100 MG tablet Take 0.5 tablets (50 mg total) by mouth daily. 90 tablet 3  . metoprolol tartrate (LOPRESSOR) 25 MG tablet TAKE 1 & 1/2 TABLETS BY MOUTH TWICE A DAY 270 tablet 2  . pantoprazole (PROTONIX) 40 MG tablet Take 40  mg by mouth daily.    . pravastatin (PRAVACHOL) 20 MG tablet Take 20 mg by mouth daily.  1  . sotalol (BETAPACE) 80 MG tablet Take 1 tablet (80 mg total) by mouth 2 (two) times daily. 180 tablet 2  . traZODone (DESYREL) 50 MG tablet Take 25-50 mg by mouth at bedtime as needed for sleep.     Marland Kitchen warfarin (COUMADIN) 5 MG tablet TAKE AS DIRECTED BY THE COUMADIN CLINIC 30 tablet 3   No current facility-administered medications for this encounter.     Allergies  Allergen Reactions  . Morphine And Related Nausea And Vomiting  .  Codeine Nausea And Vomiting    Social History   Socioeconomic History  . Marital status: Widowed    Spouse name: Not on file  . Number of children: Not on file  . Years of education: Not on file  . Highest education level: Not on file  Social Needs  . Financial resource strain: Not on file  . Food insecurity - worry: Not on file  . Food insecurity - inability: Not on file  . Transportation needs - medical: Not on file  . Transportation needs - non-medical: Not on file  Occupational History  . Not on file  Tobacco Use  . Smoking status: Never Smoker  . Smokeless tobacco: Never Used  Substance and Sexual Activity  . Alcohol use: No  . Drug use: No  . Sexual activity: Not on file  Other Topics Concern  . 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   [place]   Marital Status:single,widowed,2006.    Family History  Problem Relation Age of Onset  . Heart attack Father   . CAD Father   . Hip fracture Mother        complication  . Lung cancer Brother   . Heart attack Brother        massive  . Other Brother        mva  . Thyroid cancer Sister   . Atrial fibrillation Sister   . Ovarian cancer Sister        surviver    ROS- All systems are reviewed and negative except as per the HPI above  Physical Exam: Vitals:   09/29/17 1401  BP: 126/82  Pulse: 98  Weight: 152 lb (68.9 kg)  Height: 5\' 3"  (1.6 m)   Wt Readings from Last 3 Encounters:  09/29/17 152 lb (68.9 kg)  06/07/17 149 lb (67.6 kg)  03/08/17 149 lb 12.8 oz (67.9 kg)    Labs: Lab Results  Component Value Date   NA 137 09/29/2017   K 4.9 09/29/2017   CL 103 09/29/2017   CO2 25 09/29/2017   GLUCOSE 105 (H) 09/29/2017   BUN 19 09/29/2017   CREATININE 1.04 (H) 09/29/2017   CALCIUM 9.3 09/29/2017   MG 2.2 09/29/2017   Lab Results  Component Value Date   INR 1.9 08/19/2017   No results found for: CHOL, HDL, LDLCALC,  TRIG   GEN- The patient is well appearing, alert and oriented x 3 today.   Head- normocephalic, atraumatic Eyes-  Sclera clear, conjunctiva pink Ears- hearing intact Oropharynx- clear Neck- supple, no JVP Lymph- no cervical lymphadenopathy Lungs- Clear to ausculation bilaterally, normal work of breathing Heart- irregular rate and rhythm, no murmurs, rubs or gallops, PMI not laterally displaced GI- soft, NT, ND, + BS Extremities- no clubbing, cyanosis, or edema MS- no significant deformity  or atrophy Skin- no rash or lesion Psych- euthymic mood, full affect Neuro- strength and sensation are intact  EKG- afib at 98 bpm, qrs int 72 bpm, qtc 454 ms Epic records reviewed    Assessment and Plan: 1. Afib  On sotalol and had experienced very little afib burden but went into afib Monday am Will schedule Tee guided cardioversion for next week Continue sotalol 80 mg bid  Continue metoprolol bid Bmet/mag/cbc today INR pending tomorrow in coumadin clinc Continue warfarin for a chadsvasc score of at least 4  F/u one week after cardioversion   Butch Penny C. Mattison Stuckey, [facility] [address] [place], [place] [address] [phone]

## 2017-09-30 ENCOUNTER — Ambulatory Visit: Payer: Medicare Other | Admitting: *Deleted

## 2017-09-30 DIAGNOSIS — Z5181 Encounter for therapeutic drug level monitoring: Secondary | ICD-10-CM | POA: Diagnosis not present

## 2017-09-30 DIAGNOSIS — I4891 Unspecified atrial fibrillation: Secondary | ICD-10-CM | POA: Diagnosis not present

## 2017-09-30 LAB — POCT INR: INR: 2.1

## 2017-09-30 NOTE — Patient Instructions (Signed)
Description   Continue taking 1/2 tablet (2.5mg )  daily.  Recheck INR in 1 week before [facility] on 10/06/17 at 1030am.

## 2017-10-06 ENCOUNTER — Ambulatory Visit (HOSPITAL_COMMUNITY): Payer: Medicare Other | Admitting: Anesthesiology

## 2017-10-06 ENCOUNTER — Other Ambulatory Visit: Payer: Self-pay

## 2017-10-06 ENCOUNTER — Encounter (HOSPITAL_COMMUNITY)
Admission: RE | Disposition: A | Discharge status: Home / Self Care | Payer: Self-pay | Source: Ambulatory Visit | Attending: Cardiovascular Disease

## 2017-10-06 ENCOUNTER — Encounter (HOSPITAL_COMMUNITY): Payer: Self-pay | Admitting: Anesthesiology

## 2017-10-06 ENCOUNTER — Ambulatory Visit: Payer: Medicare Other | Admitting: *Deleted

## 2017-10-06 ENCOUNTER — Ambulatory Visit (HOSPITAL_BASED_OUTPATIENT_CLINIC_OR_DEPARTMENT_OTHER): Payer: Medicare Other

## 2017-10-06 ENCOUNTER — Ambulatory Visit (HOSPITAL_COMMUNITY)
Admission: RE | Admit: 2017-10-06 | Discharge: 2017-10-06 | Disposition: A | Discharge status: Home / Self Care | Payer: Medicare Other | Source: Ambulatory Visit | Attending: Cardiovascular Disease | Admitting: Cardiovascular Disease

## 2017-10-06 DIAGNOSIS — K219 Gastro-esophageal reflux disease without esophagitis: Secondary | ICD-10-CM | POA: Diagnosis not present

## 2017-10-06 DIAGNOSIS — I10 Essential (primary) hypertension: Secondary | ICD-10-CM | POA: Insufficient documentation

## 2017-10-06 DIAGNOSIS — E7800 Pure hypercholesterolemia, unspecified: Secondary | ICD-10-CM | POA: Insufficient documentation

## 2017-10-06 DIAGNOSIS — Z885 Allergy status to narcotic agent status: Secondary | ICD-10-CM | POA: Diagnosis not present

## 2017-10-06 DIAGNOSIS — I340 Nonrheumatic mitral (valve) insufficiency: Secondary | ICD-10-CM | POA: Diagnosis not present

## 2017-10-06 DIAGNOSIS — I481 Persistent atrial fibrillation: Secondary | ICD-10-CM

## 2017-10-06 DIAGNOSIS — M810 Age-related osteoporosis without current pathological fracture: Secondary | ICD-10-CM | POA: Diagnosis not present

## 2017-10-06 DIAGNOSIS — Z7901 Long term (current) use of anticoagulants: Secondary | ICD-10-CM | POA: Insufficient documentation

## 2017-10-06 DIAGNOSIS — Z5181 Encounter for therapeutic drug level monitoring: Secondary | ICD-10-CM | POA: Diagnosis not present

## 2017-10-06 DIAGNOSIS — I4891 Unspecified atrial fibrillation: Secondary | ICD-10-CM

## 2017-10-06 DIAGNOSIS — Z79899 Other long term (current) drug therapy: Secondary | ICD-10-CM | POA: Diagnosis not present

## 2017-10-06 DIAGNOSIS — I480 Paroxysmal atrial fibrillation: Secondary | ICD-10-CM | POA: Insufficient documentation

## 2017-10-06 DIAGNOSIS — Z8249 Family history of ischemic heart disease and other diseases of the circulatory system: Secondary | ICD-10-CM | POA: Diagnosis not present

## 2017-10-06 HISTORY — PX: TEE WITHOUT CARDIOVERSION: SHX5443

## 2017-10-06 HISTORY — PX: CARDIOVERSION: SHX1299

## 2017-10-06 LAB — ECHO TEE
PISA EROA: 0.04 cm2
Reg peak vel: 215 cm/s
TR max vel: 215 cm/s
VTI: 174 cm

## 2017-10-06 LAB — POCT INR: INR: 3.2

## 2017-10-06 SURGERY — ECHOCARDIOGRAM, TRANSESOPHAGEAL
Anesthesia: General

## 2017-10-06 MED ORDER — SODIUM CHLORIDE 0.9 % IV SOLN
INTRAVENOUS | Status: DC
  Administered 2017-10-06: 11:00:00 via INTRAVENOUS

## 2017-10-06 MED ORDER — PROPOFOL 500 MG/50ML IV EMUL
INTRAVENOUS | Status: DC | PRN
  Administered 2017-10-06: 100 ug/kg/min via INTRAVENOUS

## 2017-10-06 MED ORDER — PHENYLEPHRINE HCL 10 MG/ML IJ SOLN
INTRAMUSCULAR | Status: DC | PRN
  Administered 2017-10-06 (×3): 80 ug via INTRAVENOUS

## 2017-10-06 NOTE — Progress Notes (Signed)
  Echocardiogram Echocardiogram Transesophageal has been performed.  Darlina Sicilian M 10/06/2017, 1:30 PM

## 2017-10-06 NOTE — Transfer of Care (Signed)
Immediate Anesthesia Transfer of Care Note  Patient: Monica [name]  Procedure(s) Performed: TRANSESOPHAGEAL ECHOCARDIOGRAM (TEE) (N/A ) CARDIOVERSION (N/A )  Patient Location: Endoscopy Unit  Anesthesia Type:General  Level of Consciousness: awake, oriented and patient cooperative  Airway & Oxygen Therapy: Patient Spontanous Breathing and Patient connected to nasal cannula oxygen  Post-op Assessment: Report given to RN and Post -op Vital signs reviewed and stable  Post vital signs: Reviewed  Last Vitals:  Vitals:   10/06/17 1117 10/06/17 1328  BP: (!) 170/105 (!) 122/58  Pulse:  (!) 59  Resp: 16 15  Temp: 36.6 C   SpO2: 96% 100%    Last Pain:  Vitals:   10/06/17 1117  TempSrc: Oral         Complications: No apparent anesthesia complications

## 2017-10-06 NOTE — Anesthesia Procedure Notes (Signed)
Procedure Name: MAC Date/Time: 10/06/2017 12:50 PM Performed by: Jenne Campus, CRNA Pre-anesthesia Checklist: Patient identified, Emergency Drugs available, Suction available, Patient being monitored and Timeout performed Oxygen Delivery Method: Nasal cannula

## 2017-10-06 NOTE — CV Procedure (Signed)
Brief TEE Note  LVEF 50-55% Mild MR, TR and PR No LA/LAA thrombus or mass LA moderately enlarged  For additional details see full report  Electrical Cardioversion Procedure Note ASPASIA RUDE [id number] [birth year]  Procedure: Electrical Cardioversion Indications:  Atrial Fibrillation  Procedure Details Consent: Risks of procedure as well as the alternatives and risks of each were explained to the (patient/caregiver).  Consent for procedure obtained. Time Out: Verified patient identification, verified procedure, site/side was marked, verified correct patient position, special equipment/implants available, medications/allergies/relevent history reviewed, required imaging and test results available.  Performed  Patient placed on cardiac monitor, pulse oximetry, supplemental oxygen as necessary.  Sedation given: propofol Pacer pads placed anterior and posterior chest.  Cardioverted 1 time(s).  Cardioverted at 150J.  Evaluation Findings: Post procedure EKG shows: NSR Complications: None Patient did tolerate procedure well.   Skeet Latch, MD 10/06/2017, 1:14 PM

## 2017-10-06 NOTE — Interval H&P Note (Signed)
History and Physical Interval Note:  10/06/2017 12:56 PM  Monica [name]  has presented today for surgery, with the diagnosis of AFIB  The various methods of treatment have been discussed with the patient and family. After consideration of risks, benefits and other options for treatment, the patient has consented to  Procedure(s): TRANSESOPHAGEAL ECHOCARDIOGRAM (TEE) (N/A) CARDIOVERSION (N/A) as a surgical intervention .  The patient's history has been reviewed, patient examined, no change in status, stable for surgery.  I have reviewed the patient's chart and labs.  Questions were answered to the patient's satisfaction.     Skeet Latch , MD

## 2017-10-06 NOTE — Discharge Instructions (Signed)
TEE ° °YOU HAD AN CARDIAC PROCEDURE TODAY: Refer to the procedure report and other information in the discharge instructions given to you for any specific questions about what was found during the examination. If this information does not answer your questions, please call [facility] office at [phone] to clarify.  ° °DIET: Your first meal following the procedure should be a light meal and then it is ok to progress to your normal diet. A half-sandwich or bowl of soup is an example of a good first meal. Heavy or fried foods are harder to digest and may make you feel nauseous or bloated. Drink plenty of fluids but you should avoid alcoholic beverages for 24 hours. If you had a esophageal dilation, please see attached instructions for diet.  ° °ACTIVITY: Your care partner should take you home directly after the procedure. You should plan to take it easy, moving slowly for the rest of the day. You can resume normal activity the day after the procedure however YOU SHOULD NOT DRIVE, use power tools, machinery or perform tasks that involve climbing or major physical exertion for 24 hours (because of the sedation medicines used during the test).  ° °SYMPTOMS TO REPORT IMMEDIATELY: °A cardiologist can be reached at any hour. Please call [phone] for any of the following symptoms:  °Vomiting of blood or coffee ground material  °New, significant abdominal pain  °New, significant chest pain or pain under the shoulder blades  °Painful or persistently difficult swallowing  °New shortness of breath  °Black, tarry-looking or red, bloody stools ° °FOLLOW UP:  °Please also call with any specific questions about appointments or follow up tests. ° ° °Monitored Anesthesia Care, Care After °These instructions provide you with information about caring for yourself after your procedure. Your health care provider may also give you more specific instructions. Your treatment has been planned according to current medical  practices, but problems sometimes occur. Call your health care provider if you have any problems or questions after your procedure. °What can I expect after the procedure? °After your procedure, it is common to: °· Feel sleepy for several hours. °· Feel clumsy and have poor balance for several hours. °· Feel forgetful about what happened after the procedure. °· Have poor judgment for several hours. °· Feel nauseous or vomit. °· Have a sore throat if you had a breathing tube during the procedure. ° °Follow these instructions at home: °For at least 24 hours after the procedure: ° °· Do not: °? Participate in activities in which you could fall or become injured. °? Drive. °? Use heavy machinery. °? Drink alcohol. °? Take sleeping pills or medicines that cause drowsiness. °? Make important decisions or sign legal documents. °? Take care of children on your own. °· Rest. °Eating and drinking °· Follow the diet that is recommended by your health care provider. °· If you vomit, drink water, juice, or soup when you can drink without vomiting. °· Make sure you have little or no nausea before eating solid foods. °General instructions °· Have a responsible adult stay with you until you are awake and alert. °· Take over-the-counter and prescription medicines only as told by your health care provider. °· If you smoke, do not smoke without supervision. °· Keep all follow-up visits as told by your health care provider. This is important. °Contact a health care provider if: °· You keep feeling nauseous or you keep vomiting. °· You feel light-headed. °· You develop a rash. °· You have a   fever. °Get help right away if: °· You have trouble breathing. °This information is not intended to replace advice given to you by your health care provider. Make sure you discuss any questions you have with your health care provider. °Document Released: 11/24/2015 Document Revised: 03/25/2016 Document Reviewed: 11/24/2015 °Elsevier Interactive  Patient Education © 2018 [employer] °Electrical Cardioversion, Care After °This sheet gives you information about how to care for yourself after your procedure. Your health care provider may also give you more specific instructions. If you have problems or questions, contact your health care provider. °What can I expect after the procedure? °After the procedure, it is common to have: °· Some redness on the skin where the shocks were given. ° °Follow these instructions at home: °· Do not drive for 24 hours if you were given a medicine to help you relax (sedative). °· Take over-the-counter and prescription medicines only as told by your health care provider. °· Ask your health care provider how to check your pulse. Check it often. °· Rest for 48 hours after the procedure or as told by your health care provider. °· Avoid or limit your caffeine use as told by your health care provider. °Contact a health care provider if: °· You feel like your heart is beating too quickly or your pulse is not regular. °· You have a serious muscle cramp that does not go away. °Get help right away if: °· You have discomfort in your chest. °· You are dizzy or you feel faint. °· You have trouble breathing or you are short of breath. °· Your speech is slurred. °· You have trouble moving an arm or leg on one side of your body. °· Your fingers or toes turn cold or blue. °This information is not intended to replace advice given to you by your health care provider. Make sure you discuss any questions you have with your health care provider. °Document Released: 05/24/2013 Document Revised: 03/06/2016 Document Reviewed: 02/07/2016 °Elsevier Interactive Patient Education © 2018 [employer] ° °

## 2017-10-06 NOTE — Anesthesia Preprocedure Evaluation (Addendum)
Anesthesia Evaluation  Patient identified by MRN, date of birth, ID band Patient awake    Reviewed: Allergy & Precautions, H&P , NPO status , Patient's Chart, lab work & pertinent test results  Airway Mallampati: II   Neck ROM: full    Dental  (+) Poor Dentition, Dental Advisory Given   Pulmonary neg pulmonary ROS,    breath sounds clear to auscultation       Cardiovascular hypertension,  Rhythm:irregular Rate:Normal     Neuro/Psych    GI/Hepatic GERD  ,  Endo/Other    Renal/GU      Musculoskeletal   Abdominal   Peds  Hematology   Anesthesia Other Findings   Reproductive/Obstetrics                            Anesthesia Physical Anesthesia Plan  ASA: III  Anesthesia Plan: General   Post-op Pain Management:    Induction: Intravenous  PONV Risk Score and Plan: 3 and Propofol infusion and Treatment may vary due to age or medical condition  Airway Management Planned: Nasal Cannula  Additional Equipment:   Intra-op Plan:   Post-operative Plan:   Informed Consent: I have reviewed the patients History and Physical, chart, labs and discussed the procedure including the risks, benefits and alternatives for the proposed anesthesia with the patient or authorized representative who has indicated his/her understanding and acceptance.     Plan Discussed with: CRNA and Anesthesiologist  Anesthesia Plan Comments:         Anesthesia Quick Evaluation

## 2017-10-07 NOTE — Anesthesia Postprocedure Evaluation (Signed)
Anesthesia Post Note  Patient: Monica [name]  Procedure(s) Performed: TRANSESOPHAGEAL ECHOCARDIOGRAM (TEE) (N/A ) CARDIOVERSION (N/A )     Patient location during evaluation: PACU Anesthesia Type: General Level of consciousness: awake and alert Pain management: pain level controlled Vital Signs Assessment: post-procedure vital signs reviewed and stable Respiratory status: spontaneous breathing, nonlabored ventilation, respiratory function stable and patient connected to nasal cannula oxygen Cardiovascular status: blood pressure returned to baseline and stable Postop Assessment: no apparent nausea or vomiting Anesthetic complications: no    Last Vitals:  Vitals:   10/06/17 1345 10/06/17 1350  BP:  126/63  Pulse: 60 (!) 59  Resp: 12 (!) 24  Temp:    SpO2: 100% 100%    Last Pain:  Vitals:   10/06/17 1117  TempSrc: Oral                 [name] S

## 2017-10-14 ENCOUNTER — Encounter (HOSPITAL_COMMUNITY): Payer: Self-pay | Admitting: Nurse Practitioner

## 2017-10-14 ENCOUNTER — Ambulatory Visit (HOSPITAL_COMMUNITY)
Admission: RE | Admit: 2017-10-14 | Discharge: 2017-10-14 | Disposition: A | Discharge status: Home / Self Care | Payer: Medicare Other | Source: Ambulatory Visit | Attending: Nurse Practitioner | Admitting: Nurse Practitioner

## 2017-10-14 ENCOUNTER — Ambulatory Visit: Payer: Medicare Other | Admitting: *Deleted

## 2017-10-14 VITALS — BP 164/88 | HR 60 | Ht 63.0 in | Wt 153.0 lb

## 2017-10-14 DIAGNOSIS — Z885 Allergy status to narcotic agent status: Secondary | ICD-10-CM | POA: Diagnosis not present

## 2017-10-14 DIAGNOSIS — I10 Essential (primary) hypertension: Secondary | ICD-10-CM | POA: Insufficient documentation

## 2017-10-14 DIAGNOSIS — Z950 Presence of cardiac pacemaker: Secondary | ICD-10-CM | POA: Diagnosis not present

## 2017-10-14 DIAGNOSIS — Z87442 Personal history of urinary calculi: Secondary | ICD-10-CM | POA: Diagnosis not present

## 2017-10-14 DIAGNOSIS — Z86018 Personal history of other benign neoplasm: Secondary | ICD-10-CM | POA: Diagnosis not present

## 2017-10-14 DIAGNOSIS — I480 Paroxysmal atrial fibrillation: Secondary | ICD-10-CM | POA: Diagnosis not present

## 2017-10-14 DIAGNOSIS — Z9889 Other specified postprocedural states: Secondary | ICD-10-CM | POA: Insufficient documentation

## 2017-10-14 DIAGNOSIS — I4891 Unspecified atrial fibrillation: Secondary | ICD-10-CM | POA: Diagnosis not present

## 2017-10-14 DIAGNOSIS — E7800 Pure hypercholesterolemia, unspecified: Secondary | ICD-10-CM | POA: Insufficient documentation

## 2017-10-14 DIAGNOSIS — I998 Other disorder of circulatory system: Secondary | ICD-10-CM | POA: Diagnosis not present

## 2017-10-14 DIAGNOSIS — Z79899 Other long term (current) drug therapy: Secondary | ICD-10-CM | POA: Insufficient documentation

## 2017-10-14 DIAGNOSIS — Z808 Family history of malignant neoplasm of other organs or systems: Secondary | ICD-10-CM | POA: Diagnosis not present

## 2017-10-14 DIAGNOSIS — Z5181 Encounter for therapeutic drug level monitoring: Secondary | ICD-10-CM

## 2017-10-14 DIAGNOSIS — Z7901 Long term (current) use of anticoagulants: Secondary | ICD-10-CM | POA: Insufficient documentation

## 2017-10-14 DIAGNOSIS — K219 Gastro-esophageal reflux disease without esophagitis: Secondary | ICD-10-CM | POA: Insufficient documentation

## 2017-10-14 DIAGNOSIS — Z8249 Family history of ischemic heart disease and other diseases of the circulatory system: Secondary | ICD-10-CM | POA: Insufficient documentation

## 2017-10-14 LAB — POCT INR: INR: 2.5

## 2017-10-14 MED ORDER — LOSARTAN POTASSIUM 100 MG PO TABS: 100.0000 mg | ORAL_TABLET | Freq: Every day | ORAL | 3 refills | Status: DC

## 2017-10-14 NOTE — Progress Notes (Signed)
Primary Care Physician: Carol Ada, MD Referring Physician: Dr. Lisette Abu Monica [name] is a 77 y.o. female with a h/o ablation x 2,PPM implant 08/2016, paroxysmal afib on sotalol. She has noted afib since Monday as well as low BP in the am, this am 86 systolic, and is being seen as an urgent visit. Her BP was elevated when she was seen by Dr.Allred last fall and amlodipine 2.5 mg daily was added. She takes this and losartan 100 mg  at hs. No known triggers.  She was set up for TEE guided cardioversion as she was on warfarin and has successful cardioversion. Her BP is now elevated at 164/88, and will go back to a full tab of her Losartan. She is saying that she feels improved in afib but feels that she has had less energy for the last year, to the point that she feels it is limiting her desire to be active.  Today, she denies symptoms of  chest pain, shortness of breath, orthopnea, PND, lower extremity edema, dizziness, presyncope, syncope, or neurologic sequela. The patient is tolerating medications without difficulties and is otherwise without complaint today.   Past Medical History:  Diagnosis Date  . Adrenal adenoma 6/11   lt on ct 6/11  . GERD (gastroesophageal reflux disease)   . Hypercholesteremia   . Hypertension    [facility]  . Kidney stone 6/11   rt. on ct 6/11  . Normal nuclear stress test 2008   low risk  . Osteoporosis   . Paroxysmal atrial fibrillation ([place])    a. PVI 10/13 b. PVI 12/15   Past Surgical History:  Procedure Laterality Date  . ATRIAL FIBRILLATION ABLATION N/A 06/07/2012   PVI and CTI ablation by Dr Rayann Heman  . ATRIAL FIBRILLATION ABLATION N/A 07/31/2014   PVI by Dr Rayann Heman; initial ablation 2013  . CARDIOVERSION  05/06/2012   Procedure: CARDIOVERSION;  Surgeon: Sueanne Margarita, MD;  Location: [facility];  Service: Cardiovascular;  Laterality: N/A;  h/p in file drawer  . CARDIOVERSION N/A 10/06/2017   Procedure: CARDIOVERSION;  Surgeon: Skeet Latch,  MD;  Location: [place];  Service: Cardiovascular;  Laterality: N/A;  . CESAREAN SECTION  [phone]   x-2  . CHOLECYSTECTOMY  1989  . ELECTROPHYSIOLOGIC STUDY N/A 09/08/2016   Procedure: Cardioversion;  Surgeon: Thompson Grayer, MD;  Location: [place] [facility];  Service: Cardiovascular;  Laterality: N/A;  . EP IMPLANTABLE DEVICE N/A 09/08/2016   Procedure: Loop Recorder Removal;  Surgeon: Thompson Grayer, MD;  Location: [place] [facility];  Service: Cardiovascular;  Laterality: N/A;  . EP IMPLANTABLE DEVICE N/A 09/08/2016   Procedure: Pacemaker Implant;  Surgeon: Thompson Grayer, MD;  Location: [place] [facility];  Service: Cardiovascular;  Laterality: N/A;  . Implantable loop recorder implantation  05/30/14   Medtronic Reveal LINQ implanted by Dr Rayann Heman with RIO II protocol (ambulatory setting)  . TEE WITHOUT CARDIOVERSION  06/06/2012   Procedure: TRANSESOPHAGEAL ECHOCARDIOGRAM (TEE);  Surgeon: Jettie Booze, MD;  Location: [place];  Service: Cardiovascular;  Laterality: N/A;  . TEE WITHOUT CARDIOVERSION N/A 07/30/2014   Procedure: TRANSESOPHAGEAL ECHOCARDIOGRAM (TEE);  Surgeon: Larey Dresser, MD;  Location: [place];  Service: Cardiovascular;  Laterality: N/A;  . TEE WITHOUT CARDIOVERSION N/A 10/06/2017   Procedure: TRANSESOPHAGEAL ECHOCARDIOGRAM (TEE);  Surgeon: Skeet Latch, MD;  Location: [facility];  Service: Cardiovascular;  Laterality: N/A;    Current Outpatient Medications  Medication Sig Dispense Refill  . acetaminophen (TYLENOL) 500 MG tablet Take  500 mg by mouth every 6 (six) hours as needed. For pain    . amLODipine (NORVASC) 2.5 MG tablet Take 1 tablet (2.5 mg total) by mouth daily. 135 tablet 3  . calcium carbonate (OS-CAL) 600 MG TABS tablet Take 600 mg by mouth 2 (two) times daily with a meal.     . cholecalciferol (VITAMIN D) 1000 UNITS tablet Take 1,000 Units by mouth every evening.     Marland Kitchen losartan (COZAAR) 100 MG tablet Take 1 tablet (100 mg total) by  mouth daily. 90 tablet 3  . metoprolol tartrate (LOPRESSOR) 25 MG tablet TAKE 1 & 1/2 TABLETS BY MOUTH TWICE A DAY (Patient taking differently: Take 37.5 mg by mouth 2 (two) times daily. TAKE 1 & 1/2 TABLETS BY MOUTH TWICE A DAY) 270 tablet 2  . pantoprazole (PROTONIX) 40 MG tablet Take 40 mg by mouth daily.    . pravastatin (PRAVACHOL) 20 MG tablet Take 20 mg by mouth daily.  1  . sotalol (BETAPACE) 80 MG tablet Take 1 tablet (80 mg total) by mouth 2 (two) times daily. 180 tablet 2  . traZODone (DESYREL) 50 MG tablet Take 25-50 mg by mouth at bedtime as needed for sleep.     Marland Kitchen warfarin (COUMADIN) 5 MG tablet TAKE AS DIRECTED BY THE COUMADIN CLINIC (Patient taking differently: TAKE AS DIRECTED BY THE COUMADIN CLINIC - 2.5 mg daily) 30 tablet 3   No current facility-administered medications for this encounter.     Allergies  Allergen Reactions  . Morphine And Related Nausea And Vomiting  . Codeine Nausea And Vomiting    Social History   Socioeconomic History  . Marital status: Widowed    Spouse name: Not on file  . Number of children: Not on file  . Years of education: Not on file  . Highest education level: Not on file  Social Needs  . Financial resource strain: Not on file  . Food insecurity - worry: Not on file  . Food insecurity - inability: Not on file  . Transportation needs - medical: Not on file  . Transportation needs - non-medical: Not on file  Occupational History  . Not on file  Tobacco Use  . Smoking status: Never Smoker  . Smokeless tobacco: Never Used  Substance and Sexual Activity  . Alcohol use: No  . Drug use: No  . Sexual activity: Not on file  Other Topics Concern  . 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   [place]   Marital Status:single,widowed,2006.    Family History  Problem Relation Age of Onset  . Heart attack Father   . CAD Father   . Hip fracture Mother         complication  . Lung cancer Brother   . Heart attack Brother        massive  . Other Brother        mva  . Thyroid cancer Sister   . Atrial fibrillation Sister   . Ovarian cancer Sister        surviver    ROS- All systems are reviewed and negative except as per the HPI above  Physical Exam: Vitals:   10/14/17 1058  BP: (!) 164/88  Pulse: 60  Weight: 153 lb (69.4 kg)  Height: 5\' 3"  (1.6 m)   Wt Readings from Last 3 Encounters:  10/14/17 153 lb (69.4 kg)  10/06/17 152 lb (68.9 kg)  09/29/17 152 lb (  68.9 kg)    Labs: Lab Results  Component Value Date   NA 137 09/29/2017   K 4.9 09/29/2017   CL 103 09/29/2017   CO2 25 09/29/2017   GLUCOSE 105 (H) 09/29/2017   BUN 19 09/29/2017   CREATININE 1.04 (H) 09/29/2017   CALCIUM 9.3 09/29/2017   MG 2.2 09/29/2017   Lab Results  Component Value Date   INR 2.5 10/14/2017   No results found for: CHOL, HDL, LDLCALC, TRIG   GEN- The patient is well appearing, alert and oriented x 3 today.   Head- normocephalic, atraumatic Eyes-  Sclera clear, conjunctiva pink Ears- hearing intact Oropharynx- clear Neck- supple, no JVP Lymph- no cervical lymphadenopathy Lungs- Clear to ausculation bilaterally, normal work of breathing Heart-  regular rate and rhythm, no murmurs, rubs or gallops, PMI not laterally displaced GI- soft, NT, ND, + BS Extremities- no clubbing, cyanosis, or edema MS- no significant deformity or atrophy Skin- no rash or lesion Psych- euthymic mood, full affect Neuro- strength and sensation are intact  EKG- a paced rhythm with prolonged AV conduction, pr int 252 ms, qrs int 76 ms, qtc 444 ms Epic records reviewed    Assessment and Plan: 1. Afib  Successful cardioversion Continue sotalol 80 mg bid  Continue metoprolol bid Continue warfarin for a chadsvasc score of at least 4  2. HTN Elevated now back in SR Return to full dose of losartan  3. Feeling of lack of energy Discussed that  sotalol/metoprolol may be contributing and discussed change in antiarrythmic but she will stay with current drugs Also, has a pacer check 4/4- discuss with Amber if pacer setting can be optimized to give more energy with exertional activites   F/u  Coumadin clinic 3/21 Device clinic 4/4 Remote check 4/22 afib clinc as needed   Butch Penny C. Kylieann Eagles, [facility] [address] [place], [place] [address] [phone]

## 2017-10-14 NOTE — Patient Instructions (Signed)
Increase losartan to 100mg daily

## 2017-10-14 NOTE — Patient Instructions (Signed)
Description   Continue taking 1/2 tablet (2.5mg ) daily.  Recheck INR in 3 weeks.

## 2017-10-23 ENCOUNTER — Other Ambulatory Visit (HOSPITAL_COMMUNITY): Payer: Self-pay | Admitting: Nurse Practitioner

## 2017-11-04 ENCOUNTER — Ambulatory Visit: Payer: Medicare Other | Admitting: *Deleted

## 2017-11-04 DIAGNOSIS — I4891 Unspecified atrial fibrillation: Secondary | ICD-10-CM | POA: Diagnosis not present

## 2017-11-04 DIAGNOSIS — Z5181 Encounter for therapeutic drug level monitoring: Secondary | ICD-10-CM

## 2017-11-04 LAB — POCT INR: INR: 2.3

## 2017-11-04 NOTE — Patient Instructions (Signed)
Description   Continue taking 1/2 tablet (2.5mg) daily.  Recheck INR in 4 weeks.     

## 2017-11-09 NOTE — Progress Notes (Signed)
Electrophysiology Office Note Date: 11/18/2017  ID:  Monica [name], DOB [birth year], MRN [id number]  PCP: Carol Ada, MD Electrophysiologist: Rayann Heman  CC: Pacemaker and AF follow-up  Monica [name] is a 77 y.o. female seen today for Dr Rayann Heman.  She presents today for routine electrophysiology followup.  Since last being seen in our clinic, the patient reports doing relatively well. She has had intermittent AF, most episodes do not last more than a couple of hours.  Her biggest concern today is with fatigue with activity.  She denies chest pain, dyspnea, PND, orthopnea, nausea, vomiting, dizziness, syncope, edema, weight gain, or early satiety.  Device History: MDT dual chamber PPM implanted 2018 for tachy/brady   Past Medical History:  Diagnosis Date  . Adrenal adenoma 6/11   lt on ct 6/11  . GERD (gastroesophageal reflux disease)   . Hypercholesteremia   . Hypertension   . Kidney stone 6/11   rt. on ct 6/11  . Osteoporosis   . Paroxysmal atrial fibrillation ([place])    a. PVI 10/13 b. PVI 12/15   Past Surgical History:  Procedure Laterality Date  . ATRIAL FIBRILLATION ABLATION N/A 06/07/2012   PVI and CTI ablation by Dr Rayann Heman  . ATRIAL FIBRILLATION ABLATION N/A 07/31/2014   PVI by Dr Rayann Heman; initial ablation 2013  . CARDIOVERSION  05/06/2012   Procedure: CARDIOVERSION;  Surgeon: Sueanne Margarita, MD;  [facility];  Service: Cardiovascular;  Laterality: N/A;  h/p in file drawer  . CARDIOVERSION N/A 10/06/2017   Procedure: CARDIOVERSION;  Surgeon: Skeet Latch, MD;  Location: [place];  Service: Cardiovascular;  Laterality: N/A;  . CESAREAN SECTION  [phone]   x-2  . CHOLECYSTECTOMY  1989  . ELECTROPHYSIOLOGIC STUDY N/A 09/08/2016   Procedure: Cardioversion;  Surgeon: Thompson Grayer, MD;  Location: [place] [facility];  Service: Cardiovascular;  Laterality: N/A;  . EP IMPLANTABLE DEVICE N/A 09/08/2016   Procedure: Loop Recorder Removal;  Surgeon: Thompson Grayer, MD;  Location: [place] [facility];  Service: Cardiovascular;  Laterality: N/A;  . EP IMPLANTABLE DEVICE N/A 09/08/2016   Procedure: Pacemaker Implant;  Surgeon: Thompson Grayer, MD;  Location: [place] [facility];  Service: Cardiovascular;  Laterality: N/A;  . Implantable loop recorder implantation  05/30/14   Medtronic Reveal LINQ implanted by Dr Rayann Heman with RIO II protocol (ambulatory setting)  . TEE WITHOUT CARDIOVERSION  06/06/2012   Procedure: TRANSESOPHAGEAL ECHOCARDIOGRAM (TEE);  Surgeon: Jettie Booze, MD;  Location: [place];  Service: Cardiovascular;  Laterality: N/A;  . TEE WITHOUT CARDIOVERSION N/A 07/30/2014   Procedure: TRANSESOPHAGEAL ECHOCARDIOGRAM (TEE);  Surgeon: Larey Dresser, MD;  Location: [place];  Service: Cardiovascular;  Laterality: N/A;  . TEE WITHOUT CARDIOVERSION N/A 10/06/2017   Procedure: TRANSESOPHAGEAL ECHOCARDIOGRAM (TEE);  Surgeon: Skeet Latch, MD;  Location: [facility];  Service: Cardiovascular;  Laterality: N/A;    Current Outpatient Medications  Medication Sig Dispense Refill  . acetaminophen (TYLENOL) 500 MG tablet Take 500 mg by mouth every 6 (six) hours as needed. For pain    . amLODipine (NORVASC) 2.5 MG tablet Take 1 tablet (2.5 mg total) by mouth daily. 135 tablet 3  . calcium carbonate (OS-CAL) 600 MG TABS tablet Take 600 mg by mouth 2 (two) times daily with a meal.     . cholecalciferol (VITAMIN D) 1000 UNITS tablet Take 1,000 Units by mouth every evening.     Marland Kitchen losartan (COZAAR) 100 MG tablet Take 1 tablet (100 mg total) by mouth daily.  90 tablet 3  . metoprolol tartrate (LOPRESSOR) 25 MG tablet TAKE 1 & 1/2 TABLETS BY MOUTH TWICE A DAY (Patient taking differently: Take 37.5 mg by mouth 2 (two) times daily. TAKE 1 & 1/2 TABLETS BY MOUTH TWICE A DAY) 270 tablet 2  . pantoprazole (PROTONIX) 40 MG tablet Take 40 mg by mouth daily.    . pravastatin (PRAVACHOL) 20 MG tablet Take 20 mg by mouth daily.  1  . sotalol (BETAPACE)  80 MG tablet TAKE 1 TABLET (80 MG TOTAL) BY MOUTH 2 (TWO) TIMES DAILY. 180 tablet 2  . traZODone (DESYREL) 50 MG tablet Take 25-50 mg by mouth at bedtime as needed for sleep.     Marland Kitchen warfarin (COUMADIN) 5 MG tablet TAKE AS DIRECTED BY THE COUMADIN CLINIC (Patient taking differently: TAKE AS DIRECTED BY THE COUMADIN CLINIC - 2.5 mg daily) 30 tablet 3   No current facility-administered medications for this visit.     Allergies:   Morphine and related and Codeine   Social History: Social History   Socioeconomic History  . Marital status: Widowed    Spouse name: Not on file  . Number of children: Not on file  . Years of education: Not on file  . Highest education level: Not on file  Occupational History  . Not on file  Social Needs  . Financial resource strain: Not on file  . Food insecurity:    Worry: Not on file    Inability: Not on file  . Transportation needs:    Medical: Not on file    Non-medical: Not on file  Tobacco Use  . Smoking status: Never Smoker  . Smokeless tobacco: Never Used  Substance and Sexual Activity  . Alcohol use: No  . Drug use: No  . Sexual activity: Not on file  Lifestyle  . Physical activity:    Days per week: Not on file    Minutes per session: Not on file  . Stress: Not on file  Relationships  . Social connections:    Talks on phone: Not on file    Gets together: Not on file    Attends religious service: Not on file    Active member of club or organization: Not on file    Attends meetings of clubs or organizations: Not on file    Relationship status: Not on file  . Intimate partner violence:    Fear of current or ex partner: Not on file    Emotionally abused: Not on file    Physically abused: Not on file    Forced sexual activity: Not on file  Other Topics Concern  . 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   [place]   Marital Status:single,widowed,2006.     Family History: Family History  Problem Relation Age of Onset  . Heart attack Father   . CAD Father   . Hip fracture Mother        complication  . Lung cancer Brother   . Heart attack Brother        massive  . Other Brother        mva  . Thyroid cancer Sister   . Atrial fibrillation Sister   . Ovarian cancer Sister        surviver     Review of Systems: All other systems reviewed and are otherwise negative except as noted above.   Physical Exam: VS:  BP (!) 170/86  Pulse 61   Ht 5\' 3"  (1.6 m)   Wt 151 lb 9.6 oz (68.8 kg)   SpO2 98%   BMI 26.85 kg/m  , BMI Body mass index is 26.85 kg/m.  GEN- The patient is elderly appearing, alert and oriented x 3 today.   HEENT: normocephalic, atraumatic; sclera clear, conjunctiva pink; hearing intact; oropharynx clear; neck supple  Lungs- Clear to ausculation bilaterally, normal work of breathing.  No wheezes, rales, rhonchi Heart- Regular rate and rhythm  GI- soft, non-tender, non-distended, bowel sounds present  Extremities- no clubbing, cyanosis, or edema  MS- no significant deformity or atrophy Skin- warm and dry, no rash or lesion; PPM pocket well healed Psych- euthymic mood, full affect Neuro- strength and sensation are intact  PPM Interrogation- reviewed in detail today,  See PACEART report  EKG:  EKG is ordered today. The ekg ordered today shows atrial pacing, intrinsic ventricular conduction, rate 61, QTc 434  Recent Labs: 09/29/2017: BUN 19; Creatinine, Ser 1.04; Hemoglobin 13.1; Magnesium 2.2; Platelets 336; Potassium 4.9; Sodium 137   Wt Readings from Last 3 Encounters:  11/18/17 151 lb 9.6 oz (68.8 kg)  10/14/17 153 lb (69.4 kg)  10/06/17 152 lb (68.9 kg)     Other studies Reviewed: Additional studies/ records that were reviewed today include: Dr Jackalyn Lombard notes, AF clinic notes  Assessment and Plan:  1.  Symptomatic bradycardia Normal PPM function See Pace Art report Heart rates blunted and  patient with fatigue - rate response turned on today.   2.  Persistent atrial fibrillation Burden by device interrogation 1.6% Continue Warfarin long term for CHADS2VASC of 3 QTc stable today BMET, Mg today  3.  HTN Stable No change required today   Current medicines are reviewed at length with the patient today.   The patient does not have concerns regarding her medicines.  The following changes were made today:  none  Labs/ tests ordered today include: BMET, Mg Orders Placed This Encounter  Procedures  . Basic metabolic panel  . Magnesium  . CUP PACEART [place]  . EKG 12-Lead     Disposition:   Follow up with [facility], me in 3 months     Signed, Chanetta Marshall, NP 11/18/2017 10:29 AM  [facility] [place] [address] [phone] (office) [phone] (fax)

## 2017-11-18 ENCOUNTER — Encounter: Payer: Self-pay | Admitting: Nurse Practitioner

## 2017-11-18 ENCOUNTER — Ambulatory Visit: Payer: Medicare Other | Admitting: Nurse Practitioner

## 2017-11-18 ENCOUNTER — Telehealth: Payer: Self-pay | Admitting: Nurse Practitioner

## 2017-11-18 VITALS — BP 170/86 | HR 61 | Ht 63.0 in | Wt 151.6 lb

## 2017-11-18 DIAGNOSIS — R001 Bradycardia, unspecified: Secondary | ICD-10-CM | POA: Diagnosis not present

## 2017-11-18 DIAGNOSIS — I10 Essential (primary) hypertension: Secondary | ICD-10-CM | POA: Diagnosis not present

## 2017-11-18 DIAGNOSIS — I4819 Other persistent atrial fibrillation: Secondary | ICD-10-CM

## 2017-11-18 DIAGNOSIS — I481 Persistent atrial fibrillation: Secondary | ICD-10-CM | POA: Diagnosis not present

## 2017-11-18 DIAGNOSIS — E875 Hyperkalemia: Secondary | ICD-10-CM

## 2017-11-18 LAB — BASIC METABOLIC PANEL
BUN / CREAT RATIO: 19 (ref 12–28)
BUN: 19 mg/dL (ref 8–27)
CO2: 26 mmol/L (ref 20–29)
CREATININE: 0.99 mg/dL (ref 0.57–1.00)
Calcium: 10.6 mg/dL — ABNORMAL HIGH (ref 8.7–10.3)
Chloride: 99 mmol/L (ref 96–106)
GFR, EST AFRICAN AMERICAN: 64 mL/min/{1.73_m2} (ref >59)
GFR, EST NON AFRICAN AMERICAN: 55 mL/min/{1.73_m2} — AB (ref >59)
Glucose: 95 mg/dL (ref 65–99)
Potassium: 5.9 mmol/L (ref 3.5–5.2)
Sodium: 137 mmol/L (ref 134–144)

## 2017-11-18 LAB — CUP PACEART INCLINIC DEVICE CHECK
Date Time Interrogation Session: 20190404102458
Implantable Lead Implant Date: 20180123
Implantable Lead Location: 753860
Implantable Lead Model: 5076
Implantable Pulse Generator Implant Date: 20180123
MDC IDC LEAD IMPLANT DT: 20180123
MDC IDC LEAD LOCATION: 753859

## 2017-11-18 LAB — MAGNESIUM: Magnesium: 2.2 mg/dL (ref 1.6–2.3)

## 2017-11-18 NOTE — Telephone Encounter (Signed)
NEW MESSAGE    [facility] calling to report critical lab.

## 2017-11-18 NOTE — Patient Instructions (Addendum)
Medication Instructions:   Your physician recommends that you continue on your current medications as directed. Please refer to the Current Medication list given to you today.   If you need a refill on your cardiac medications before your next appointment, please call your pharmacy.  Labwork: .BMET AND MAG  TODAY    Testing/Procedures:  NONE ORDERED  TODAY     Follow-Up: Your physician wants you to follow-up in:  IN  [place] will receive a reminder letter in the mail two months in advance. If you don't receive a letter, please call our office to schedule the follow-up appointment.    Remote monitoring is used to monitor your Pacemaker of ICD from home. This monitoring reduces the number of office visits required to check your device to one time per year. It allows [place] to keep an eye on the functioning of your device to ensure it is working properly. You are scheduled for a device check from home on .12-06-17 You may send your transmission at any time that day. If you have a wireless device, the transmission will be sent automatically. After your physician reviews your transmission, you will receive a postcard with your next transmission date.     Any Other Special Instructions Will Be Listed Below (If Applicable).

## 2017-11-18 NOTE — Telephone Encounter (Signed)
Instructed patient to avoid potassium rich foods, motrin, and to decrease losartan from 100mg  to 50mg  bid. She is going to come into the office on 4/5 for a repeat lab draw (per Dr Tamala Julian DOD). Pt verbalized understanding and had no additional questions.

## 2017-11-19 ENCOUNTER — Other Ambulatory Visit: Payer: Medicare Other | Admitting: *Deleted

## 2017-11-19 ENCOUNTER — Other Ambulatory Visit: Payer: Self-pay | Admitting: Interventional Cardiology

## 2017-11-19 LAB — BASIC METABOLIC PANEL
BUN / CREAT RATIO: 22 (ref 12–28)
BUN: 22 mg/dL (ref 8–27)
CHLORIDE: 101 mmol/L (ref 96–106)
CO2: 25 mmol/L (ref 20–29)
CREATININE: 1 mg/dL (ref 0.57–1.00)
Calcium: 9.6 mg/dL (ref 8.7–10.3)
GFR calc non Af Amer: 54 mL/min/{1.73_m2} — ABNORMAL LOW (ref >59)
GFR, EST AFRICAN AMERICAN: 63 mL/min/{1.73_m2} (ref >59)
GLUCOSE: 85 mg/dL (ref 65–99)
Potassium: 4.4 mmol/L (ref 3.5–5.2)
SODIUM: 136 mmol/L (ref 134–144)

## 2017-11-19 NOTE — Addendum Note (Signed)
Addended by: Dollene Primrose on: 11/19/2017 11:16 AM   Modules accepted: Orders

## 2017-12-02 ENCOUNTER — Ambulatory Visit: Payer: Medicare Other | Admitting: Pharmacist

## 2017-12-02 DIAGNOSIS — Z5181 Encounter for therapeutic drug level monitoring: Secondary | ICD-10-CM

## 2017-12-02 DIAGNOSIS — I4891 Unspecified atrial fibrillation: Secondary | ICD-10-CM | POA: Diagnosis not present

## 2017-12-02 LAB — POCT INR: INR: 2.3

## 2017-12-02 NOTE — Patient Instructions (Signed)
Description   Continue taking 1/2 tablet (2.5mg) daily.  Recheck INR in 5 weeks.     

## 2017-12-06 ENCOUNTER — Ambulatory Visit (INDEPENDENT_AMBULATORY_CARE_PROVIDER_SITE_OTHER): Payer: Medicare Other | Admitting: *Deleted

## 2017-12-06 DIAGNOSIS — R001 Bradycardia, unspecified: Secondary | ICD-10-CM | POA: Diagnosis not present

## 2017-12-06 NOTE — Progress Notes (Signed)
Remote pacemaker transmission.   

## 2017-12-07 ENCOUNTER — Encounter: Payer: Self-pay | Admitting: Cardiology

## 2017-12-07 LAB — CUP PACEART REMOTE DEVICE CHECK
Battery Remaining Longevity: 101 mo
Battery Voltage: 3.02 V
Brady Statistic AP VS Percent: 76.49 %
Brady Statistic RA Percent Paced: 71.2 %
Date Time Interrogation Session: 20190422152357
Implantable Lead Implant Date: 20180123
Implantable Lead Location: 753860
Implantable Lead Model: 5076
Implantable Pulse Generator Implant Date: 20180123
Lead Channel Impedance Value: 570 Ohm
Lead Channel Pacing Threshold Amplitude: 0.75 V
Lead Channel Pacing Threshold Pulse Width: 0.4 ms
Lead Channel Sensing Intrinsic Amplitude: 1.5 mV
Lead Channel Sensing Intrinsic Amplitude: 1.5 mV
Lead Channel Setting Sensing Sensitivity: 2 mV
MDC IDC LEAD IMPLANT DT: 20180123
MDC IDC LEAD LOCATION: 753859
MDC IDC MSMT LEADCHNL RA IMPEDANCE VALUE: 323 Ohm
MDC IDC MSMT LEADCHNL RA IMPEDANCE VALUE: 418 Ohm
MDC IDC MSMT LEADCHNL RV IMPEDANCE VALUE: 456 Ohm
MDC IDC MSMT LEADCHNL RV PACING THRESHOLD AMPLITUDE: 0.75 V
MDC IDC MSMT LEADCHNL RV PACING THRESHOLD PULSEWIDTH: 0.4 ms
MDC IDC MSMT LEADCHNL RV SENSING INTR AMPL: 7.625 mV
MDC IDC MSMT LEADCHNL RV SENSING INTR AMPL: 7.625 mV
MDC IDC SET LEADCHNL RA PACING AMPLITUDE: 2 V
MDC IDC SET LEADCHNL RV PACING AMPLITUDE: 2.5 V
MDC IDC SET LEADCHNL RV PACING PULSEWIDTH: 0.4 ms
MDC IDC STAT BRADY AP VP PERCENT: 0.06 %
MDC IDC STAT BRADY AS VP PERCENT: 3.91 %
MDC IDC STAT BRADY AS VS PERCENT: 19.53 %
MDC IDC STAT BRADY RV PERCENT PACED: 3.89 %

## 2018-01-06 ENCOUNTER — Ambulatory Visit: Payer: Medicare Other

## 2018-01-06 DIAGNOSIS — I4891 Unspecified atrial fibrillation: Secondary | ICD-10-CM

## 2018-01-06 DIAGNOSIS — Z5181 Encounter for therapeutic drug level monitoring: Secondary | ICD-10-CM | POA: Diagnosis not present

## 2018-01-06 LAB — POCT INR: INR: 2.4 (ref 2.0–3.0)

## 2018-01-06 NOTE — Patient Instructions (Signed)
Description   Continue on same dosage 1/2 tablet (2.5mg) daily.  Recheck INR in 6 weeks.      

## 2018-02-16 ENCOUNTER — Ambulatory Visit: Payer: Medicare Other | Admitting: Pharmacist

## 2018-02-16 DIAGNOSIS — Z5181 Encounter for therapeutic drug level monitoring: Secondary | ICD-10-CM | POA: Diagnosis not present

## 2018-02-16 DIAGNOSIS — I4891 Unspecified atrial fibrillation: Secondary | ICD-10-CM | POA: Diagnosis not present

## 2018-02-16 LAB — POCT INR: INR: 2.5 (ref 2.0–3.0)

## 2018-02-16 NOTE — Patient Instructions (Signed)
Description   Continue on same dosage 1/2 tablet (2.5mg ) daily.  Recheck INR in 6 weeks.

## 2018-03-07 ENCOUNTER — Ambulatory Visit (INDEPENDENT_AMBULATORY_CARE_PROVIDER_SITE_OTHER): Payer: Medicare Other | Admitting: *Deleted

## 2018-03-07 DIAGNOSIS — I480 Paroxysmal atrial fibrillation: Secondary | ICD-10-CM

## 2018-03-07 IMAGING — DX DG CHEST 2V
2 series · 2 of 2 positions shown · non-contrast
Comparison: PA and lateral chest x-ray May 02, 2012

CLINICAL DATA: Status post ICD placement

EXAM:
CHEST  2 VIEW

[chest pa]
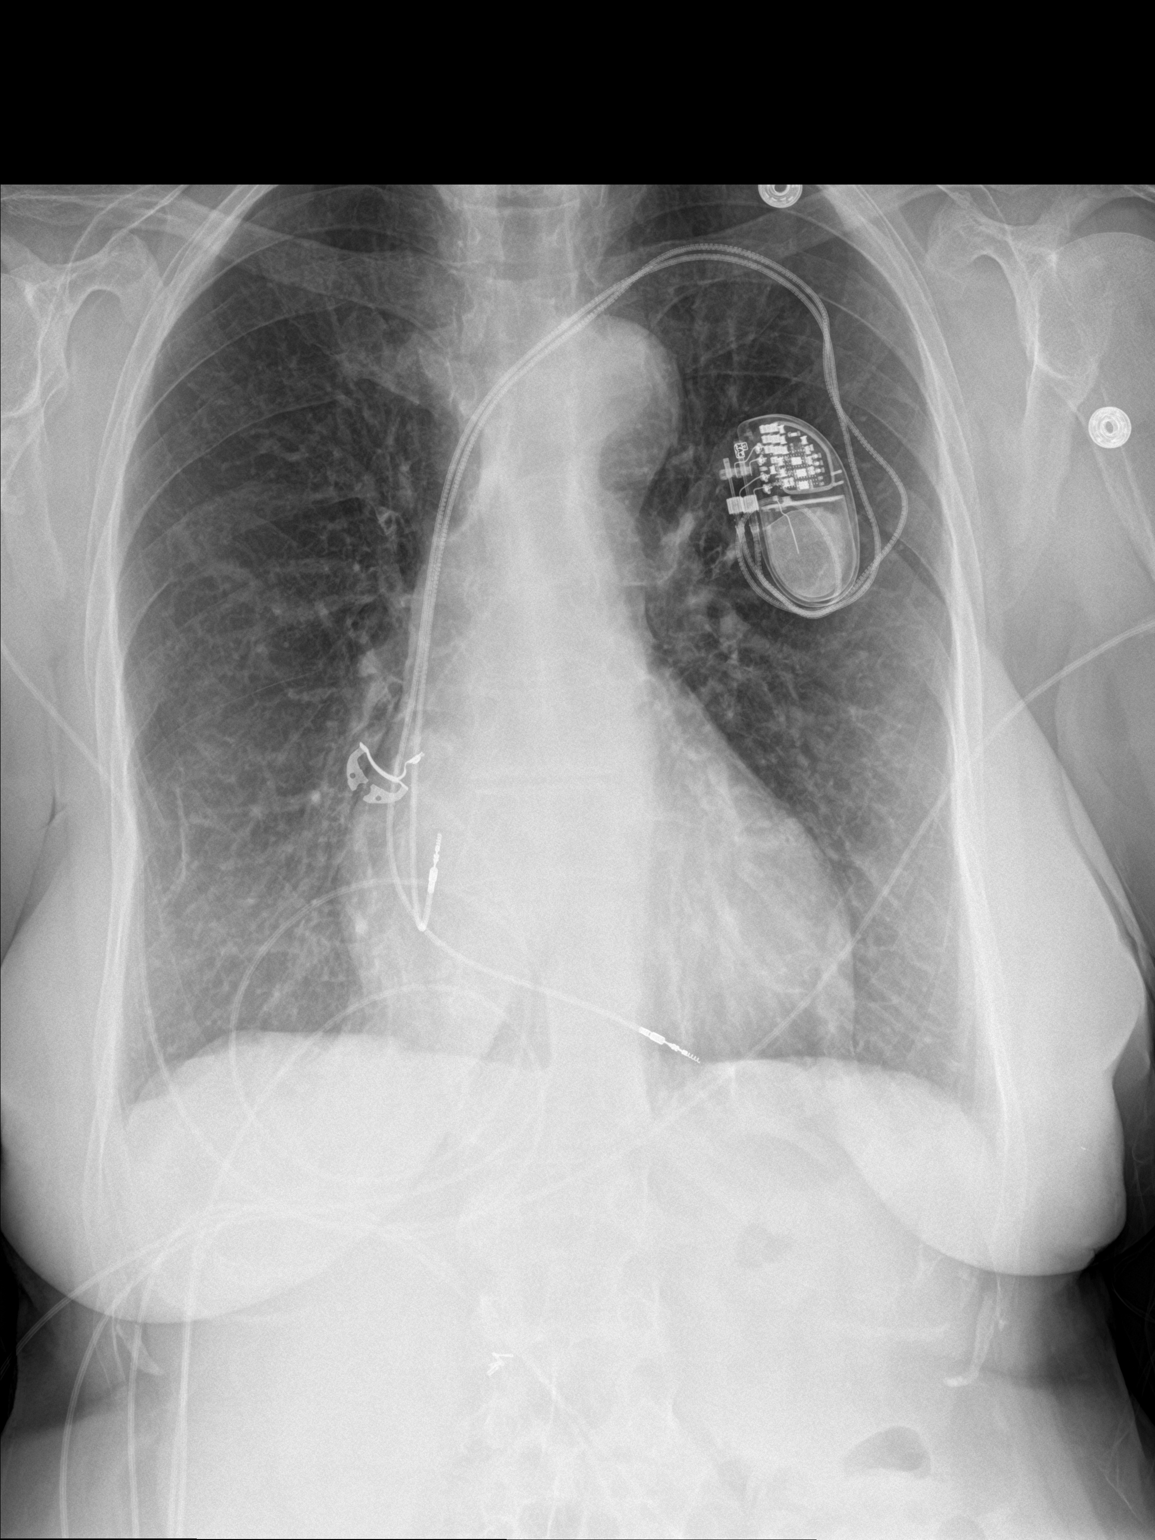

[chest lat]
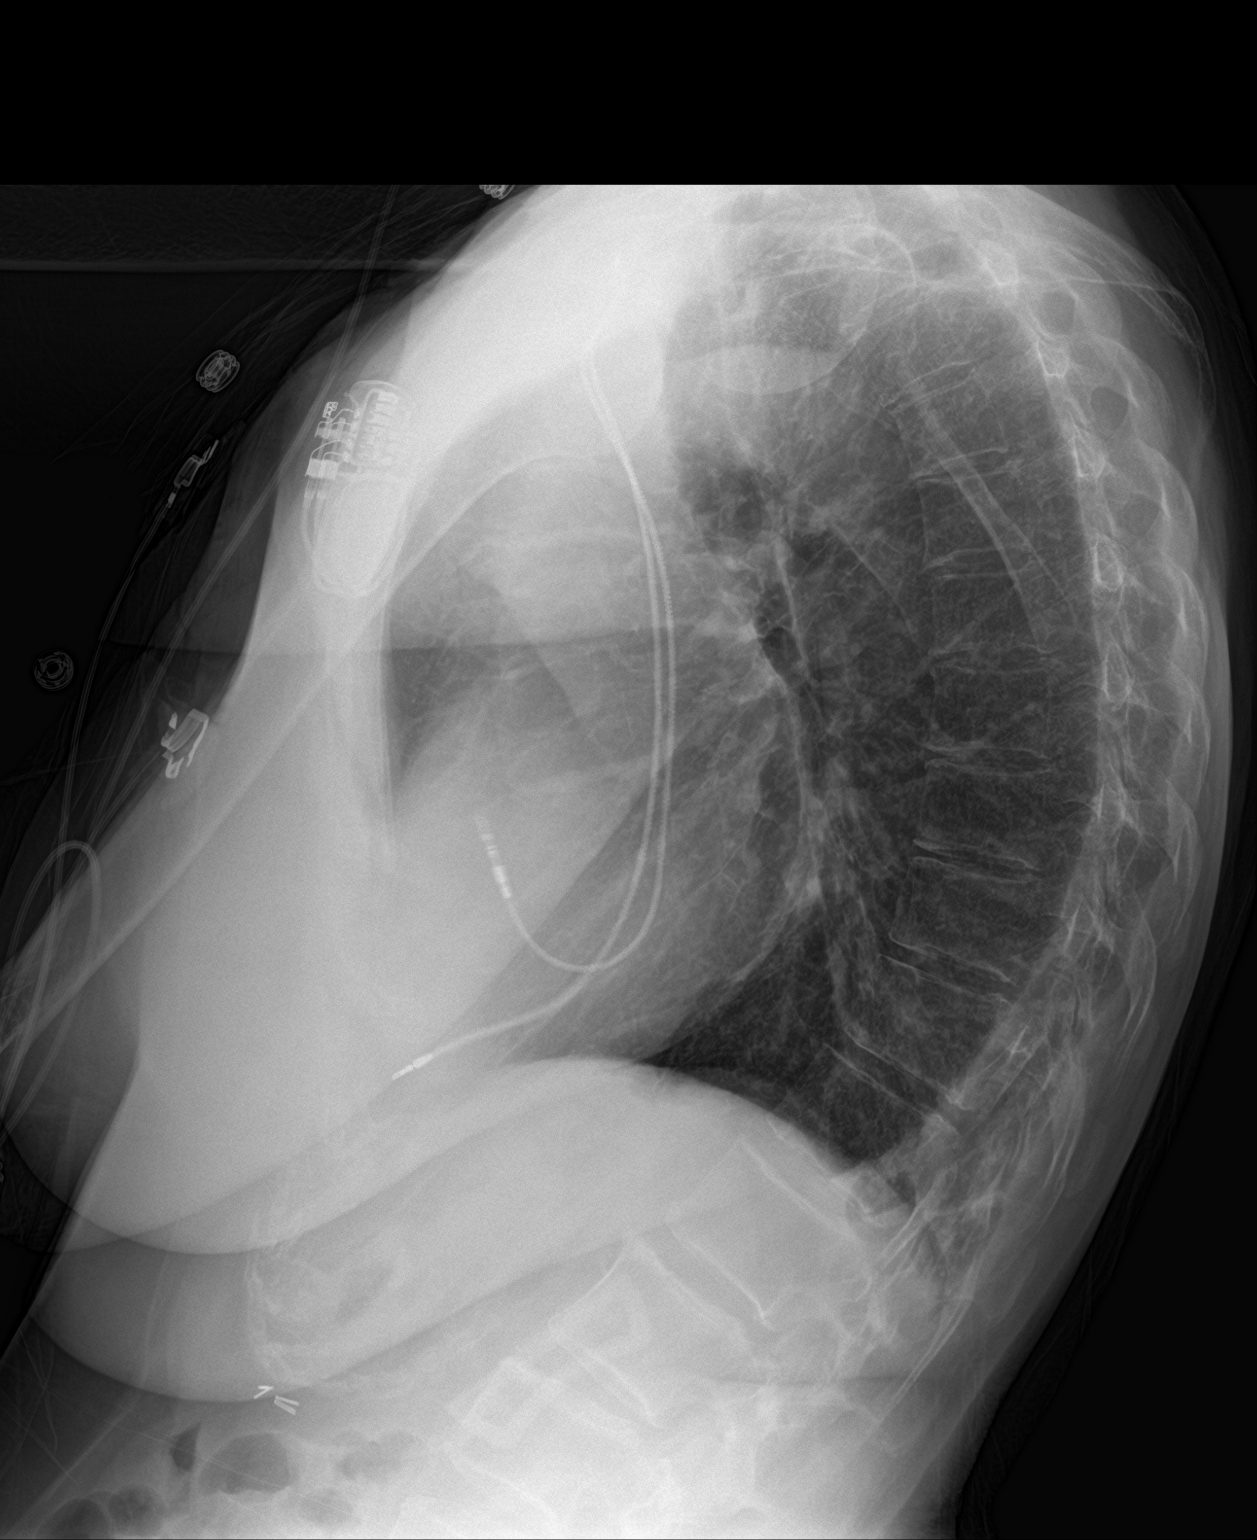

[2 of 2 positions shown; findings below may reference images not displayed]

FINDINGS: The lungs remain hyperinflated and the interstitial markings are
coarse though stable. There is no pneumothorax or pleural effusion.
The cardiac silhouette and pulmonary vascularity are normal. The ICD
generator and electrodes are in reasonable position
radiographically. The bony thorax exhibits no acute abnormality.
IMPRESSION: COPD. There is no postprocedure complication following ICD
placement.

## 2018-03-07 NOTE — Progress Notes (Signed)
Remote pacemaker transmission.   

## 2018-03-08 ENCOUNTER — Encounter: Payer: Self-pay | Admitting: Cardiology

## 2018-03-22 ENCOUNTER — Encounter (HOSPITAL_COMMUNITY): Payer: Self-pay | Admitting: Nurse Practitioner

## 2018-03-22 ENCOUNTER — Ambulatory Visit (HOSPITAL_COMMUNITY)
Admission: RE | Admit: 2018-03-22 | Discharge: 2018-03-22 | Disposition: A | Discharge status: Home / Self Care | Payer: Medicare Other | Source: Ambulatory Visit | Attending: Nurse Practitioner | Admitting: Nurse Practitioner

## 2018-03-22 ENCOUNTER — Ambulatory Visit (HOSPITAL_COMMUNITY)
Admission: RE | Admit: 2018-03-22 | Discharge: 2018-03-22 | Disposition: A | Discharge status: Home / Self Care | Payer: Medicare Other | Source: Ambulatory Visit | Attending: Family Medicine | Admitting: Family Medicine

## 2018-03-22 VITALS — BP 154/92 | HR 86 | Ht 63.0 in | Wt 155.0 lb

## 2018-03-22 DIAGNOSIS — K219 Gastro-esophageal reflux disease without esophagitis: Secondary | ICD-10-CM | POA: Diagnosis not present

## 2018-03-22 DIAGNOSIS — Z8249 Family history of ischemic heart disease and other diseases of the circulatory system: Secondary | ICD-10-CM | POA: Diagnosis not present

## 2018-03-22 DIAGNOSIS — Z87442 Personal history of urinary calculi: Secondary | ICD-10-CM | POA: Diagnosis not present

## 2018-03-22 DIAGNOSIS — Z7901 Long term (current) use of anticoagulants: Secondary | ICD-10-CM | POA: Insufficient documentation

## 2018-03-22 DIAGNOSIS — E7800 Pure hypercholesterolemia, unspecified: Secondary | ICD-10-CM | POA: Diagnosis not present

## 2018-03-22 DIAGNOSIS — I481 Persistent atrial fibrillation: Secondary | ICD-10-CM | POA: Diagnosis not present

## 2018-03-22 DIAGNOSIS — Z885 Allergy status to narcotic agent status: Secondary | ICD-10-CM | POA: Diagnosis not present

## 2018-03-22 DIAGNOSIS — I4891 Unspecified atrial fibrillation: Secondary | ICD-10-CM | POA: Diagnosis present

## 2018-03-22 DIAGNOSIS — Z9889 Other specified postprocedural states: Secondary | ICD-10-CM | POA: Insufficient documentation

## 2018-03-22 DIAGNOSIS — I10 Essential (primary) hypertension: Secondary | ICD-10-CM | POA: Diagnosis not present

## 2018-03-22 DIAGNOSIS — I480 Paroxysmal atrial fibrillation: Secondary | ICD-10-CM | POA: Insufficient documentation

## 2018-03-22 DIAGNOSIS — I493 Ventricular premature depolarization: Secondary | ICD-10-CM | POA: Insufficient documentation

## 2018-03-22 DIAGNOSIS — Z9049 Acquired absence of other specified parts of digestive tract: Secondary | ICD-10-CM | POA: Diagnosis not present

## 2018-03-22 DIAGNOSIS — Z79899 Other long term (current) drug therapy: Secondary | ICD-10-CM | POA: Diagnosis not present

## 2018-03-22 DIAGNOSIS — Z950 Presence of cardiac pacemaker: Secondary | ICD-10-CM | POA: Diagnosis not present

## 2018-03-22 DIAGNOSIS — I4819 Other persistent atrial fibrillation: Secondary | ICD-10-CM

## 2018-03-22 LAB — BASIC METABOLIC PANEL
Anion gap: 6 (ref 5–15)
BUN: 20 mg/dL (ref 8–23)
CHLORIDE: 102 mmol/L (ref 98–111)
CO2: 28 mmol/L (ref 22–32)
Calcium: 9.5 mg/dL (ref 8.9–10.3)
Creatinine, Ser: 0.96 mg/dL (ref 0.44–1.00)
GFR calc Af Amer: >60 mL/min (ref >60)
GFR calc non Af Amer: 55 mL/min — ABNORMAL LOW (ref >60)
GLUCOSE: 98 mg/dL (ref 70–99)
POTASSIUM: 4.9 mmol/L (ref 3.5–5.1)
Sodium: 136 mmol/L (ref 135–145)

## 2018-03-22 LAB — CBC
HCT: 41.2 % (ref 36.0–46.0)
HEMOGLOBIN: 12.7 g/dL (ref 12.0–15.0)
MCH: 26.7 pg (ref 26.0–34.0)
MCHC: 30.8 g/dL (ref 30.0–36.0)
MCV: 86.6 fL (ref 78.0–100.0)
Platelets: 297 10*3/uL (ref 150–400)
RBC: 4.76 MIL/uL (ref 3.87–5.11)
RDW: 16.3 % — ABNORMAL HIGH (ref 11.5–15.5)
WBC: 5.8 10*3/uL (ref 4.0–10.5)

## 2018-03-22 LAB — PROTIME-INR
INR: 2.03
Prothrombin Time: 22.8 seconds — ABNORMAL HIGH (ref 11.4–15.2)

## 2018-03-22 NOTE — Patient Instructions (Signed)
Cardioversion scheduled for Monday, August 12th  -Have INR check at coumadin clinic prior to hospital   - Arrive at the [facility] and go to admitting at [facility] not eat or drink anything after midnight the night prior to your procedure.  - Take all your medication with a sip of water prior to arrival.  - You will not be able to drive home after your procedure.

## 2018-03-22 NOTE — Progress Notes (Signed)
Primary Care Physician: Carol Ada, MD Referring Physician: Dr. Lisette Abu Monica [name] is a 77 y.o. 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 She has noted afib x 3 days.  She still has some fluctuations in BP often seeing low BP readings in the am and then will hold all am drugs, missing several BB doses over the last few weeks. No missed sotalol. No missed doses of sotalol 80 mg bid. She last had a TEE guided cardioversion in 09/2017 which has held her in rhythm until now. She feels very fatigued in SR.  Today, she denies symptoms of  chest pain, shortness of breath, orthopnea, PND, lower extremity edema, dizziness, presyncope, syncope, or neurologic sequela. The patient is tolerating medications without difficulties and is otherwise without complaint today.   Past Medical History:  Diagnosis Date  . Adrenal adenoma 6/11   lt on ct 6/11  . GERD (gastroesophageal reflux disease)   . Hypercholesteremia   . Hypertension   . Kidney stone 6/11   rt. on ct 6/11  . Osteoporosis   . Paroxysmal atrial fibrillation ([place])    a. PVI 10/13 b. PVI 12/15   Past Surgical History:  Procedure Laterality Date  . ATRIAL FIBRILLATION ABLATION N/A 06/07/2012   PVI and CTI ablation by Dr Rayann Heman  . ATRIAL FIBRILLATION ABLATION N/A 07/31/2014   PVI by Dr Rayann Heman; initial ablation 2013  . CARDIOVERSION  05/06/2012   Procedure: CARDIOVERSION;  Surgeon: Sueanne Margarita, MD;  [facility];  Service: Cardiovascular;  Laterality: N/A;  h/p in file drawer  . CARDIOVERSION N/A 10/06/2017   Procedure: CARDIOVERSION;  Surgeon: Skeet Latch, MD;  Location: [place];  Service: Cardiovascular;  Laterality: N/A;  . CESAREAN SECTION  [phone]   x-2  . CHOLECYSTECTOMY  1989  . ELECTROPHYSIOLOGIC STUDY N/A 09/08/2016   Procedure: Cardioversion;  Surgeon: Thompson Grayer, MD;  Location: [place] [facility];  Service: Cardiovascular;  Laterality: N/A;  . EP  IMPLANTABLE DEVICE N/A 09/08/2016   Procedure: Loop Recorder Removal;  Surgeon: Thompson Grayer, MD;  Location: [place] [facility];  Service: Cardiovascular;  Laterality: N/A;  . EP IMPLANTABLE DEVICE N/A 09/08/2016   Procedure: Pacemaker Implant;  Surgeon: Thompson Grayer, MD;  Location: [place] [facility];  Service: Cardiovascular;  Laterality: N/A;  . Implantable loop recorder implantation  05/30/14   Medtronic Reveal LINQ implanted by Dr Rayann Heman with RIO II protocol (ambulatory setting)  . TEE WITHOUT CARDIOVERSION  06/06/2012   Procedure: TRANSESOPHAGEAL ECHOCARDIOGRAM (TEE);  Surgeon: Jettie Booze, MD;  Location: [place];  Service: Cardiovascular;  Laterality: N/A;  . TEE WITHOUT CARDIOVERSION N/A 07/30/2014   Procedure: TRANSESOPHAGEAL ECHOCARDIOGRAM (TEE);  Surgeon: Larey Dresser, MD;  Location: [facility];  Service: Cardiovascular;  Laterality: N/A;  . TEE WITHOUT CARDIOVERSION N/A 10/06/2017   Procedure: TRANSESOPHAGEAL ECHOCARDIOGRAM (TEE);  Surgeon: Skeet Latch, MD;  [facility];  Service: Cardiovascular;  Laterality: N/A;    Current Outpatient Medications  Medication Sig Dispense Refill  . acetaminophen (TYLENOL) 500 MG tablet Take 500 mg by mouth every 6 (six) hours as needed. For pain    . amLODipine (NORVASC) 2.5 MG tablet Take 1 tablet (2.5 mg total) by mouth daily. 135 tablet 3  . calcium carbonate (OS-CAL) 600 MG TABS tablet Take 600 mg by mouth 2 (two) times daily with a meal.     . cholecalciferol (VITAMIN D) 1000 UNITS tablet Take 1,000 Units by  mouth every evening.     Marland Kitchen losartan (COZAAR) 100 MG tablet Take 1 tablet (100 mg total) by mouth daily. 90 tablet 3  . metoprolol tartrate (LOPRESSOR) 25 MG tablet TAKE 1 & 1/2 TABLETS BY MOUTH TWICE A DAY (Patient taking differently: Take 37.5 mg by mouth 2 (two) times daily. TAKE 1 & 1/2 TABLETS BY MOUTH TWICE A DAY) 270 tablet 2  . pantoprazole (PROTONIX) 40 MG tablet Take 40 mg by mouth daily.    .  pravastatin (PRAVACHOL) 20 MG tablet Take 20 mg by mouth daily.  1  . sotalol (BETAPACE) 80 MG tablet TAKE 1 TABLET (80 MG TOTAL) BY MOUTH 2 (TWO) TIMES DAILY. 180 tablet 2  . traZODone (DESYREL) 50 MG tablet Take 25-50 mg by mouth at bedtime as needed for sleep.     Marland Kitchen warfarin (COUMADIN) 5 MG tablet TAKE AS DIRECTED BY THE COUMADIN CLINIC (Patient taking differently: TAKE AS DIRECTED BY THE COUMADIN CLINIC - 2.5 mg daily) 30 tablet 3   No current facility-administered medications for this encounter.     Allergies  Allergen Reactions  . Morphine And Related Nausea And Vomiting  . Codeine Nausea And Vomiting    Social History   Socioeconomic History  . Marital status: Widowed    Spouse name: Not on file  . Number of children: Not on file  . Years of education: Not on file  . Highest education level: Not on file  Occupational History  . Not on file  Social Needs  . Financial resource strain: Not on file  . Food insecurity:    Worry: Not on file    Inability: Not on file  . Transportation needs:    Medical: Not on file    Non-medical: Not on file  Tobacco Use  . Smoking status: Never Smoker  . Smokeless tobacco: Never Used  Substance and Sexual Activity  . Alcohol use: No  . Drug use: No  . Sexual activity: Not on file  Lifestyle  . Physical activity:    Days per week: Not on file    Minutes per session: Not on file  . Stress: Not on file  Relationships  . Social connections:    Talks on phone: Not on file    Gets together: Not on file    Attends religious service: Not on file    Active member of club or organization: Not on file    Attends meetings of clubs or organizations: Not on file    Relationship status: Not on file  . Intimate partner violence:    Fear of current or ex partner: Not on file    Emotionally abused: Not on file    Physically abused: Not on file    Forced sexual activity: Not on file  Other Topics Concern  . 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   [place]   Marital Status:single,widowed,2006.    Family History  Problem Relation Age of Onset  . Heart attack Father   . CAD Father   . Hip fracture Mother        complication  . Lung cancer Brother   . Heart attack Brother        massive  . Other Brother        mva  . Thyroid cancer Sister   . Atrial fibrillation Sister   . Ovarian cancer Sister        surviver  ROS- All systems are reviewed and negative except as per the HPI above  Physical Exam: Vitals:   03/22/18 1520  BP: (!) 154/92  Pulse: 86  Weight: 155 lb (70.3 kg)  Height: 5\' 3"  (1.6 m)   Wt Readings from Last 3 Encounters:  03/22/18 155 lb (70.3 kg)  11/18/17 151 lb 9.6 oz (68.8 kg)  10/14/17 153 lb (69.4 kg)    Labs: Lab Results  Component Value Date   NA 136 11/19/2017   K 4.4 11/19/2017   CL 101 11/19/2017   CO2 25 11/19/2017   GLUCOSE 85 11/19/2017   BUN 22 11/19/2017   CREATININE 1.00 11/19/2017   CALCIUM 9.6 11/19/2017   MG 2.2 11/18/2017   Lab Results  Component Value Date   INR 2.5 02/16/2018   No results found for: CHOL, HDL, LDLCALC, TRIG   GEN- The patient is well appearing, alert and oriented x 3 today.   Head- normocephalic, atraumatic Eyes-  Sclera clear, conjunctiva pink Ears- hearing intact Oropharynx- clear Neck- supple, no JVP Lymph- no cervical lymphadenopathy Lungs- Clear to ausculation bilaterally, normal work of breathing Heart- irregular rate and rhythm, no murmurs, rubs or gallops, PMI not laterally displaced GI- soft, NT, ND, + BS Extremities- no clubbing, cyanosis, or edema MS- no significant deformity or atrophy Skin- no rash or lesion Psych- euthymic mood, full affect Neuro- strength and sensation are intact  EKG- afib with v rate at 86 bpm, qrs int 72 ms, qtc 461 ms Epic records reviewed    Assessment and Plan: 1. Afib  Successful cardioversion  09/2017 Now back in afib x 3 days Continue sotalol 80 mg bid  Continue metoprolol bid Continue warfarin for a chadsvasc score of at least 4 Pt would like to purse a TEE guided cardioversion She is not interested in changing antiarrythmic's at this point  INR, bmet,cbc today  2. HTN  Has drops in early am and feels weak She was taking losartan, amlodipine and metoprolol at hs Recommend moving losartan to am Try not to hold am  BB dose, as this may trigger afib May be able to get by not using amlodipine daily but only if BP spikes, most of BP's readings at home are controlled    3. PPM Per device clinic  F/u one week in afib clinic   [place]. Odella Appelhans, [facility] [address] [place], [place] [address] [phone]

## 2018-03-22 NOTE — H&P (View-Only) (Signed)
Primary Care Physician: Carol Ada, MD Referring Physician: Dr. Lisette Abu Monica [name] is a 77 y.o. 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 She has noted afib x 3 days.  She still has some fluctuations in BP often seeing low BP readings in the am and then will hold all am drugs, missing several BB doses over the last few weeks. No missed sotalol. No missed doses of sotalol 80 mg bid. She last had a TEE guided cardioversion in 09/2017 which has held her in rhythm until now. She feels very fatigued in SR.  Today, she denies symptoms of  chest pain, shortness of breath, orthopnea, PND, lower extremity edema, dizziness, presyncope, syncope, or neurologic sequela. The patient is tolerating medications without difficulties and is otherwise without complaint today.   Past Medical History:  Diagnosis Date  . Adrenal adenoma 6/11   lt on ct 6/11  . GERD (gastroesophageal reflux disease)   . Hypercholesteremia   . Hypertension   . Kidney stone 6/11   rt. on ct 6/11  . Osteoporosis   . Paroxysmal atrial fibrillation ([place])    a. PVI 10/13 b. PVI 12/15   Past Surgical History:  Procedure Laterality Date  . ATRIAL FIBRILLATION ABLATION N/A 06/07/2012   PVI and CTI ablation by Dr Rayann Heman  . ATRIAL FIBRILLATION ABLATION N/A 07/31/2014   PVI by Dr Rayann Heman; initial ablation 2013  . CARDIOVERSION  05/06/2012   Procedure: CARDIOVERSION;  Surgeon: Sueanne Margarita, MD;  Location: [facility];  Service: Cardiovascular;  Laterality: N/A;  h/p in file drawer  . CARDIOVERSION N/A 10/06/2017   Procedure: CARDIOVERSION;  Surgeon: Skeet Latch, MD;  Location: [place] [facility];  Service: Cardiovascular;  Laterality: N/A;  . CESAREAN SECTION  [phone]   x-2  . CHOLECYSTECTOMY  1989  . ELECTROPHYSIOLOGIC STUDY N/A 09/08/2016   Procedure: Cardioversion;  Surgeon: Thompson Grayer, MD;  Location: [place] [facility];  Service: Cardiovascular;  Laterality: N/A;  . EP  IMPLANTABLE DEVICE N/A 09/08/2016   Procedure: Loop Recorder Removal;  Surgeon: Thompson Grayer, MD;  Location: [place] [facility];  Service: Cardiovascular;  Laterality: N/A;  . EP IMPLANTABLE DEVICE N/A 09/08/2016   Procedure: Pacemaker Implant;  Surgeon: Thompson Grayer, MD;  Location: [place] [facility];  Service: Cardiovascular;  Laterality: N/A;  . Implantable loop recorder implantation  05/30/14   Medtronic Reveal LINQ implanted by Dr Rayann Heman with RIO II protocol (ambulatory setting)  . TEE WITHOUT CARDIOVERSION  06/06/2012   Procedure: TRANSESOPHAGEAL ECHOCARDIOGRAM (TEE);  Surgeon: Jettie Booze, MD;  Location: [place] [facility];  Service: Cardiovascular;  Laterality: N/A;  . TEE WITHOUT CARDIOVERSION N/A 07/30/2014   Procedure: TRANSESOPHAGEAL ECHOCARDIOGRAM (TEE);  Surgeon: Larey Dresser, MD;  Location: [place];  Service: Cardiovascular;  Laterality: N/A;  . TEE WITHOUT CARDIOVERSION N/A 10/06/2017   Procedure: TRANSESOPHAGEAL ECHOCARDIOGRAM (TEE);  Surgeon: Skeet Latch, MD;  Location: [facility];  Service: Cardiovascular;  Laterality: N/A;    Current Outpatient Medications  Medication Sig Dispense Refill  . acetaminophen (TYLENOL) 500 MG tablet Take 500 mg by mouth every 6 (six) hours as needed. For pain    . amLODipine (NORVASC) 2.5 MG tablet Take 1 tablet (2.5 mg total) by mouth daily. 135 tablet 3  . calcium carbonate (OS-CAL) 600 MG TABS tablet Take 600 mg by mouth 2 (two) times daily with a meal.     . cholecalciferol (VITAMIN D) 1000 UNITS tablet Take 1,000 Units by  mouth every evening.     Marland Kitchen losartan (COZAAR) 100 MG tablet Take 1 tablet (100 mg total) by mouth daily. 90 tablet 3  . metoprolol tartrate (LOPRESSOR) 25 MG tablet TAKE 1 & 1/2 TABLETS BY MOUTH TWICE A DAY (Patient taking differently: Take 37.5 mg by mouth 2 (two) times daily. TAKE 1 & 1/2 TABLETS BY MOUTH TWICE A DAY) 270 tablet 2  . pantoprazole (PROTONIX) 40 MG tablet Take 40 mg by mouth daily.    .  pravastatin (PRAVACHOL) 20 MG tablet Take 20 mg by mouth daily.  1  . sotalol (BETAPACE) 80 MG tablet TAKE 1 TABLET (80 MG TOTAL) BY MOUTH 2 (TWO) TIMES DAILY. 180 tablet 2  . traZODone (DESYREL) 50 MG tablet Take 25-50 mg by mouth at bedtime as needed for sleep.     Marland Kitchen warfarin (COUMADIN) 5 MG tablet TAKE AS DIRECTED BY THE COUMADIN CLINIC (Patient taking differently: TAKE AS DIRECTED BY THE COUMADIN CLINIC - 2.5 mg daily) 30 tablet 3   No current facility-administered medications for this encounter.     Allergies  Allergen Reactions  . Morphine And Related Nausea And Vomiting  . Codeine Nausea And Vomiting    Social History   Socioeconomic History  . Marital status: Widowed    Spouse name: Not on file  . Number of children: Not on file  . Years of education: Not on file  . Highest education level: Not on file  Occupational History  . Not on file  Social Needs  . Financial resource strain: Not on file  . Food insecurity:    Worry: Not on file    Inability: Not on file  . Transportation needs:    Medical: Not on file    Non-medical: Not on file  Tobacco Use  . Smoking status: Never Smoker  . Smokeless tobacco: Never Used  Substance and Sexual Activity  . Alcohol use: No  . Drug use: No  . Sexual activity: Not on file  Lifestyle  . Physical activity:    Days per week: Not on file    Minutes per session: Not on file  . Stress: Not on file  Relationships  . Social connections:    Talks on phone: Not on file    Gets together: Not on file    Attends religious service: Not on file    Active member of club or organization: Not on file    Attends meetings of clubs or organizations: Not on file    Relationship status: Not on file  . Intimate partner violence:    Fear of current or ex partner: Not on file    Emotionally abused: Not on file    Physically abused: Not on file    Forced sexual activity: Not on file  Other Topics Concern  . 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   [place]   Marital Status:single,widowed,2006.    Family History  Problem Relation Age of Onset  . Heart attack Father   . CAD Father   . Hip fracture Mother        complication  . Lung cancer Brother   . Heart attack Brother        massive  . Other Brother        mva  . Thyroid cancer Sister   . Atrial fibrillation Sister   . Ovarian cancer Sister        surviver  ROS- All systems are reviewed and negative except as per the HPI above  Physical Exam: Vitals:   03/22/18 1520  BP: (!) 154/92  Pulse: 86  Weight: 155 lb (70.3 kg)  Height: 5\' 3"  (1.6 m)   Wt Readings from Last 3 Encounters:  03/22/18 155 lb (70.3 kg)  11/18/17 151 lb 9.6 oz (68.8 kg)  10/14/17 153 lb (69.4 kg)    Labs: Lab Results  Component Value Date   NA 136 11/19/2017   K 4.4 11/19/2017   CL 101 11/19/2017   CO2 25 11/19/2017   GLUCOSE 85 11/19/2017   BUN 22 11/19/2017   CREATININE 1.00 11/19/2017   CALCIUM 9.6 11/19/2017   MG 2.2 11/18/2017   Lab Results  Component Value Date   INR 2.5 02/16/2018   No results found for: CHOL, HDL, LDLCALC, TRIG   GEN- The patient is well appearing, alert and oriented x 3 today.   Head- normocephalic, atraumatic Eyes-  Sclera clear, conjunctiva pink Ears- hearing intact Oropharynx- clear Neck- supple, no JVP Lymph- no cervical lymphadenopathy Lungs- Clear to ausculation bilaterally, normal work of breathing Heart- irregular rate and rhythm, no murmurs, rubs or gallops, PMI not laterally displaced GI- soft, NT, ND, + BS Extremities- no clubbing, cyanosis, or edema MS- no significant deformity or atrophy Skin- no rash or lesion Psych- euthymic mood, full affect Neuro- strength and sensation are intact  EKG- afib with v rate at 86 bpm, qrs int 72 ms, qtc 461 ms Epic records reviewed    Assessment and Plan: 1. Afib  Successful cardioversion  09/2017 Now back in afib x 3 days Continue sotalol 80 mg bid  Continue metoprolol bid Continue warfarin for a chadsvasc score of at least 4 Pt would like to purse a TEE guided cardioversion She is not interested in changing antiarrythmic's at this point  INR, bmet,cbc today  2. HTN  Has drops in early am and feels weak She was taking losartan, amlodipine and metoprolol at hs Recommend moving losartan to am Try not to hold am  BB dose, as this may trigger afib May be able to get by not using amlodipine daily but only if BP spikes, most of BP's readings at home are controlled    3. PPM Per device clinic  F/u one week in afib clinic   [place]. Deneise Getty, [facility] [address] [place], [place] [address] [phone]

## 2018-03-23 ENCOUNTER — Ambulatory Visit (INDEPENDENT_AMBULATORY_CARE_PROVIDER_SITE_OTHER): Payer: Medicare Other | Admitting: Cardiology

## 2018-03-23 DIAGNOSIS — I4891 Unspecified atrial fibrillation: Secondary | ICD-10-CM

## 2018-03-23 DIAGNOSIS — Z5181 Encounter for therapeutic drug level monitoring: Secondary | ICD-10-CM | POA: Diagnosis not present

## 2018-03-23 NOTE — Patient Instructions (Signed)
Description   Spoke with pt and instructed to take 1 tablet today then continue on same dosage 1/2 tablet (2.5mg ) daily.  Recheck INR on 03/28/18 prior to DCCV.

## 2018-03-27 NOTE — Anesthesia Preprocedure Evaluation (Addendum)
Anesthesia Evaluation  Patient identified by MRN, date of birth, ID band Patient awake    Reviewed: Allergy & Precautions, NPO status , Patient's Chart, lab work & pertinent test results  History of Anesthesia Complications Negative for: history of anesthetic complications  Airway Mallampati: II  TM Distance: >3 FB Neck ROM: Full    Dental  (+) Teeth Intact, Dental Advisory Given   Pulmonary neg pulmonary ROS,    breath sounds clear to auscultation       Cardiovascular hypertension, Pt. on medications and Pt. on home beta blockers + dysrhythmias (A Fib s/p ablation x2 on sotalol and coumadin. Cardioverted in 09/2017 and remained in sinus rhythm until 03/19/18)  Rhythm:Irregular Rate:Normal     Neuro/Psych negative neurological ROS  negative psych ROS   GI/Hepatic Neg liver ROS, GERD  Medicated,  Endo/Other  negative endocrine ROS  Renal/GU negative Renal ROS     Musculoskeletal  (+) Arthritis ,   Abdominal   Peds  Hematology negative hematology ROS (+)   Anesthesia Other Findings   Reproductive/Obstetrics                            Anesthesia Physical Anesthesia Plan  ASA: III  Anesthesia Plan: General   Post-op Pain Management:    Induction: Intravenous  PONV Risk Score and Plan: 2 and Propofol infusion and Treatment may vary due to age or medical condition  Airway Management Planned: Natural Airway and Simple Face Mask  Additional Equipment:   Intra-op Plan:   Post-operative Plan:   Informed Consent: I have reviewed the patients History and Physical, chart, labs and discussed the procedure including the risks, benefits and alternatives for the proposed anesthesia with the patient or authorized representative who has indicated his/her understanding and acceptance.   Dental advisory given  Plan Discussed with: CRNA  Anesthesia Plan Comments:        Anesthesia Quick  Evaluation

## 2018-03-28 ENCOUNTER — Other Ambulatory Visit: Payer: Self-pay

## 2018-03-28 ENCOUNTER — Ambulatory Visit (HOSPITAL_COMMUNITY): Payer: Medicare Other | Admitting: Anesthesiology

## 2018-03-28 ENCOUNTER — Ambulatory Visit (HOSPITAL_COMMUNITY)
Admission: RE | Admit: 2018-03-28 | Discharge: 2018-03-28 | Disposition: A | Discharge status: Home / Self Care | Payer: Medicare Other | Source: Ambulatory Visit | Attending: Cardiology | Admitting: Cardiology

## 2018-03-28 ENCOUNTER — Ambulatory Visit (HOSPITAL_BASED_OUTPATIENT_CLINIC_OR_DEPARTMENT_OTHER): Payer: Medicare Other

## 2018-03-28 ENCOUNTER — Encounter (HOSPITAL_COMMUNITY): Payer: Self-pay

## 2018-03-28 ENCOUNTER — Ambulatory Visit: Payer: Medicare Other

## 2018-03-28 ENCOUNTER — Encounter (HOSPITAL_COMMUNITY)
Admission: RE | Disposition: A | Discharge status: Home / Self Care | Payer: Self-pay | Source: Ambulatory Visit | Attending: Cardiology

## 2018-03-28 DIAGNOSIS — E7800 Pure hypercholesterolemia, unspecified: Secondary | ICD-10-CM | POA: Diagnosis not present

## 2018-03-28 DIAGNOSIS — I4891 Unspecified atrial fibrillation: Secondary | ICD-10-CM

## 2018-03-28 DIAGNOSIS — Z8249 Family history of ischemic heart disease and other diseases of the circulatory system: Secondary | ICD-10-CM | POA: Insufficient documentation

## 2018-03-28 DIAGNOSIS — I340 Nonrheumatic mitral (valve) insufficiency: Secondary | ICD-10-CM | POA: Diagnosis not present

## 2018-03-28 DIAGNOSIS — M1990 Unspecified osteoarthritis, unspecified site: Secondary | ICD-10-CM | POA: Diagnosis not present

## 2018-03-28 DIAGNOSIS — M810 Age-related osteoporosis without current pathological fracture: Secondary | ICD-10-CM | POA: Insufficient documentation

## 2018-03-28 DIAGNOSIS — Z7901 Long term (current) use of anticoagulants: Secondary | ICD-10-CM | POA: Diagnosis not present

## 2018-03-28 DIAGNOSIS — K219 Gastro-esophageal reflux disease without esophagitis: Secondary | ICD-10-CM | POA: Diagnosis not present

## 2018-03-28 DIAGNOSIS — I10 Essential (primary) hypertension: Secondary | ICD-10-CM | POA: Diagnosis not present

## 2018-03-28 DIAGNOSIS — I480 Paroxysmal atrial fibrillation: Secondary | ICD-10-CM | POA: Diagnosis not present

## 2018-03-28 DIAGNOSIS — Z5181 Encounter for therapeutic drug level monitoring: Secondary | ICD-10-CM | POA: Diagnosis not present

## 2018-03-28 HISTORY — PX: CARDIOVERSION: SHX1299

## 2018-03-28 HISTORY — PX: TEE WITHOUT CARDIOVERSION: SHX5443

## 2018-03-28 LAB — POCT INR: INR: 3 (ref 2.0–3.0)

## 2018-03-28 SURGERY — ECHOCARDIOGRAM, TRANSESOPHAGEAL
Anesthesia: General

## 2018-03-28 MED ORDER — LIDOCAINE 2% (20 MG/ML) 5 ML SYRINGE
INTRAMUSCULAR | Status: DC | PRN
  Administered 2018-03-28: 100 mg via INTRAVENOUS

## 2018-03-28 MED ORDER — SODIUM CHLORIDE 0.9 % IV SOLN
INTRAVENOUS | Status: DC
  Administered 2018-03-28: 500 mL via INTRAVENOUS

## 2018-03-28 MED ORDER — PHENYLEPHRINE 40 MCG/ML (10ML) SYRINGE FOR IV PUSH (FOR BLOOD PRESSURE SUPPORT)
PREFILLED_SYRINGE | INTRAVENOUS | Status: DC | PRN
  Administered 2018-03-28: 120 ug via INTRAVENOUS

## 2018-03-28 MED ORDER — PROPOFOL 500 MG/50ML IV EMUL
INTRAVENOUS | Status: DC | PRN
  Administered 2018-03-28: 100 ug/kg/min via INTRAVENOUS

## 2018-03-28 MED ORDER — PROPOFOL 10 MG/ML IV BOLUS
INTRAVENOUS | Status: DC | PRN
  Administered 2018-03-28: 40 mg via INTRAVENOUS
  Administered 2018-03-28: 10 mg via INTRAVENOUS

## 2018-03-28 MED ORDER — LACTATED RINGERS IV SOLN: INTRAVENOUS | Status: DC

## 2018-03-28 NOTE — Progress Notes (Signed)
Medtronic unavailable to interrogate pacemaker, per Dr. Meda Coffee patient ok to discharge to home, remote interrogation will be performed.

## 2018-03-28 NOTE — Transfer of Care (Addendum)
Immediate Anesthesia Transfer of Care Note  Patient: Monica [name]  Procedure(s) Performed: TRANSESOPHAGEAL ECHOCARDIOGRAM (TEE) (N/A ) CARDIOVERSION (N/A )  Patient Location: Endoscopy Unit  Anesthesia Type:General  Level of Consciousness: drowsy  Airway & Oxygen Therapy: Patient Spontanous Breathing and Patient connected to nasal cannula oxygen  Post-op Assessment: Report given to RN and Post -op Vital signs reviewed and stable  Post vital signs: Reviewed and stable  Last Vitals:  Vitals Value Taken Time  BP    Temp    Pulse    Resp    SpO2      Last Pain:  Vitals:   03/28/18 0929  PainSc: 0-No pain         Complications: No apparent anesthesia complications

## 2018-03-28 NOTE — CV Procedure (Signed)
     Transesophageal Echocardiogram Note  Monica [name] [id number] 1941-07-15  Procedure: Transesophageal Echocardiogram Indications: atrial fibrillation  Procedure Details Consent: Obtained Time Out: Verified patient identification, verified procedure, site/side was marked, verified correct patient position, special equipment/implants available, Radiology Safety Procedures followed,  medications/allergies/relevent history reviewed, required imaging and test results available.  Performed  Medications: During this procedure the patient is administered iv propofol by anesthesia staff.  The patient's heart rate, blood pressure, and oxygen saturation are monitored continuously during the procedure. The period of conscious sedation is 30 minutes, of which I was present face-to-face 100% of this time.  No intracardiac source of embolism.   Complications: No apparent complications Patient did tolerate procedure well.  Ena Dawley, MD, [facility] 03/28/2018, 10:55 AM     Cardioversion Note  Monica [name] [id number] 10-Oct-1940  Procedure: DC Cardioversion Indications: atrial fibrillation  Procedure Details Consent: Obtained Time Out: Verified patient identification, verified procedure, site/side was marked, verified correct patient position, special equipment/implants available, Radiology Safety Procedures followed,  medications/allergies/relevent history reviewed, required imaging and test results available.  Performed  The patient has been on adequate anticoagulation.  The patient received IV propofol for sedation.  Synchronous cardioversion was performed at 120 joules.  The cardioversion was successful. Atrial fibrillation with RVR was replaced by AV sequential pacing. Patient's pacemaker will be interrogated by Medtr[employer].    Complications: No apparent complications Patient did tolerate procedure well.   Ena Dawley, MD, [facility] 03/28/2018, 11:02  AM

## 2018-03-28 NOTE — Discharge Instructions (Signed)
TEE ° °YOU HAD AN CARDIAC PROCEDURE TODAY: Refer to the procedure report and other information in the discharge instructions given to you for any specific questions about what was found during the examination. If this information does not answer your questions, please call [facility] office at [phone] to clarify.  ° °DIET: Your first meal following the procedure should be a light meal and then it is ok to progress to your normal diet. A half-sandwich or bowl of soup is an example of a good first meal. Heavy or fried foods are harder to digest and may make you feel nauseous or bloated. Drink plenty of fluids but you should avoid alcoholic beverages for 24 hours. If you had a esophageal dilation, please see attached instructions for diet.  ° °ACTIVITY: Your care partner should take you home directly after the procedure. You should plan to take it easy, moving slowly for the rest of the day. You can resume normal activity the day after the procedure however YOU SHOULD NOT DRIVE, use power tools, machinery or perform tasks that involve climbing or major physical exertion for 24 hours (because of the sedation medicines used during the test).  ° °SYMPTOMS TO REPORT IMMEDIATELY: °A cardiologist can be reached at any hour. Please call [phone] for any of the following symptoms:  °Vomiting of blood or coffee ground material  °New, significant abdominal pain  °New, significant chest pain or pain under the shoulder blades  °Painful or persistently difficult swallowing  °New shortness of breath  °Black, tarry-looking or red, bloody stools ° °FOLLOW UP:  °Please also call with any specific questions about appointments or follow up tests. ° °Electrical Cardioversion, Care After °This sheet gives you information about how to care for yourself after your procedure. Your health care provider may also give you more specific instructions. If you have problems or questions, contact your health care provider. °What can I  expect after the procedure? °After the procedure, it is common to have: °· Some redness on the skin where the shocks were given. ° °Follow these instructions at home: °· Do not drive for 24 hours if you were given a medicine to help you relax (sedative). °· Take over-the-counter and prescription medicines only as told by your health care provider. °· Ask your health care provider how to check your pulse. Check it often. °· Rest for 48 hours after the procedure or as told by your health care provider. °· Avoid or limit your caffeine use as told by your health care provider. °Contact a health care provider if: °· You feel like your heart is beating too quickly or your pulse is not regular. °· You have a serious muscle cramp that does not go away. °Get help right away if: °· You have discomfort in your chest. °· You are dizzy or you feel faint. °· You have trouble breathing or you are short of breath. °· Your speech is slurred. °· You have trouble moving an arm or leg on one side of your body. °· Your fingers or toes turn cold or blue. °This information is not intended to replace advice given to you by your health care provider. Make sure you discuss any questions you have with your health care provider. °Document Released: 05/24/2013 Document Revised: 03/06/2016 Document Reviewed: 02/07/2016 °Elsevier Interactive Patient Education © 2018 [employer] ° °

## 2018-03-28 NOTE — Anesthesia Postprocedure Evaluation (Signed)
Anesthesia Post Note  Patient: Monica [name]  Procedure(s) Performed: TRANSESOPHAGEAL ECHOCARDIOGRAM (TEE) (N/A ) CARDIOVERSION (N/A )     Patient location during evaluation: PACU Anesthesia Type: General Level of consciousness: awake and alert Pain management: pain level controlled Vital Signs Assessment: post-procedure vital signs reviewed and stable Respiratory status: spontaneous breathing, nonlabored ventilation and respiratory function stable Cardiovascular status: blood pressure returned to baseline and stable Postop Assessment: no apparent nausea or vomiting Anesthetic complications: no    Last Vitals:  Vitals:   03/28/18 1120 03/28/18 1130  BP: (!) 148/71 (!) 146/73  Pulse: 60 66  Resp: 13 10  Temp:    SpO2: 100% 100%    Last Pain:  Vitals:   03/28/18 1130  TempSrc:   PainSc: 0-No pain                 Chelsey L Woodrum

## 2018-03-28 NOTE — Interval H&P Note (Signed)
History and Physical Interval Note:  03/28/2018 9:38 AM  Monica [name]  has presented today for surgery, with the diagnosis of afib  The various methods of treatment have been discussed with the patient and family. After consideration of risks, benefits and other options for treatment, the patient has consented to  Procedure(s): TRANSESOPHAGEAL ECHOCARDIOGRAM (TEE) (N/A) CARDIOVERSION (N/A) as a surgical intervention .  The patient's history has been reviewed, patient examined, no change in status, stable for surgery.  I have reviewed the patient's chart and labs.  Questions were answered to the patient's satisfaction.     Ena Dawley

## 2018-03-28 NOTE — Patient Instructions (Signed)
Description   Continue taking 1/2 tablet (2.5mg ) daily.  Recheck INR on 04/11/18 two weeks after DCCV.

## 2018-03-28 NOTE — Progress Notes (Signed)
  Echocardiogram Echocardiogram Transesophageal has been performed.  Jannett Celestine 03/28/2018, 11:11 AM

## 2018-03-29 ENCOUNTER — Encounter (HOSPITAL_COMMUNITY): Payer: Self-pay | Admitting: Cardiology

## 2018-03-29 NOTE — Addendum Note (Signed)
Addendum  created 03/29/18 1532 by Freddrick March, MD   Sign clinical note, SmartForm saved

## 2018-04-02 LAB — CUP PACEART REMOTE DEVICE CHECK
Battery Remaining Longevity: 101 mo
Battery Voltage: 3.02 V
Brady Statistic AP VP Percent: 0.05 %
Brady Statistic AP VS Percent: 85.16 %
Brady Statistic AS VS Percent: 14.26 %
Brady Statistic RA Percent Paced: 83.71 %
Date Time Interrogation Session: 20190722154932
Implantable Lead Implant Date: 20180123
Implantable Lead Implant Date: 20180123
Implantable Lead Location: 753860
Implantable Lead Model: 5076
Implantable Pulse Generator Implant Date: 20180123
Lead Channel Impedance Value: 323 Ohm
Lead Channel Impedance Value: 456 Ohm
Lead Channel Pacing Threshold Amplitude: 0.75 V
Lead Channel Pacing Threshold Amplitude: 1 V
Lead Channel Pacing Threshold Pulse Width: 0.4 ms
Lead Channel Pacing Threshold Pulse Width: 0.4 ms
Lead Channel Sensing Intrinsic Amplitude: 1.75 mV
Lead Channel Sensing Intrinsic Amplitude: 1.75 mV
Lead Channel Sensing Intrinsic Amplitude: 8 mV
MDC IDC LEAD LOCATION: 753859
MDC IDC MSMT LEADCHNL RV IMPEDANCE VALUE: 475 Ohm
MDC IDC MSMT LEADCHNL RV IMPEDANCE VALUE: 589 Ohm
MDC IDC MSMT LEADCHNL RV SENSING INTR AMPL: 8 mV
MDC IDC SET LEADCHNL RA PACING AMPLITUDE: 2 V
MDC IDC SET LEADCHNL RV PACING AMPLITUDE: 2.5 V
MDC IDC SET LEADCHNL RV PACING PULSEWIDTH: 0.4 ms
MDC IDC SET LEADCHNL RV SENSING SENSITIVITY: 2 mV
MDC IDC STAT BRADY AS VP PERCENT: 0.53 %
MDC IDC STAT BRADY RV PERCENT PACED: 0.58 %

## 2018-04-06 ENCOUNTER — Ambulatory Visit (HOSPITAL_COMMUNITY)
Admission: RE | Admit: 2018-04-06 | Discharge: 2018-04-06 | Disposition: A | Discharge status: Home / Self Care | Payer: Medicare Other | Source: Ambulatory Visit | Attending: Nurse Practitioner | Admitting: Nurse Practitioner

## 2018-04-06 VITALS — BP 108/60 | HR 63 | Ht 63.0 in | Wt 155.6 lb

## 2018-04-06 DIAGNOSIS — Z79899 Other long term (current) drug therapy: Secondary | ICD-10-CM | POA: Diagnosis not present

## 2018-04-06 DIAGNOSIS — I480 Paroxysmal atrial fibrillation: Secondary | ICD-10-CM

## 2018-04-06 DIAGNOSIS — I4891 Unspecified atrial fibrillation: Secondary | ICD-10-CM | POA: Diagnosis not present

## 2018-04-06 DIAGNOSIS — Z9049 Acquired absence of other specified parts of digestive tract: Secondary | ICD-10-CM | POA: Insufficient documentation

## 2018-04-06 DIAGNOSIS — I10 Essential (primary) hypertension: Secondary | ICD-10-CM | POA: Diagnosis not present

## 2018-04-06 DIAGNOSIS — E7800 Pure hypercholesterolemia, unspecified: Secondary | ICD-10-CM | POA: Diagnosis not present

## 2018-04-06 DIAGNOSIS — Z885 Allergy status to narcotic agent status: Secondary | ICD-10-CM | POA: Diagnosis not present

## 2018-04-06 DIAGNOSIS — K219 Gastro-esophageal reflux disease without esophagitis: Secondary | ICD-10-CM | POA: Diagnosis not present

## 2018-04-06 DIAGNOSIS — Z8249 Family history of ischemic heart disease and other diseases of the circulatory system: Secondary | ICD-10-CM | POA: Insufficient documentation

## 2018-04-06 DIAGNOSIS — Z9889 Other specified postprocedural states: Secondary | ICD-10-CM | POA: Insufficient documentation

## 2018-04-06 NOTE — Progress Notes (Signed)
Primary Care Physician: Carol Ada, MD Referring Physician: Dr. Lisette Abu Monica [name] is a 77 y.o. 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 She has noted afib x 3 days.  She still has some fluctuations in BP often seeing low BP readings in the am and then will hold all am drugs, missing several BB doses over the last few weeks. No missed sotalol. No missed doses of sotalol 80 mg bid. She last had a TEE guided cardioversion in 09/2017 which has held her in rhythm until now. She feels very fatigued in SR.  F/u in afib clinic, 8/22. She had successful cardioversion 8/12. She is AV paced today and feels improved.  Today, she denies symptoms of  chest pain, shortness of breath, orthopnea, PND, lower extremity edema, dizziness, presyncope, syncope, or neurologic sequela. The patient is tolerating medications without difficulties and is otherwise without complaint today.   Past Medical History:  Diagnosis Date  . Adrenal adenoma 6/11   lt on ct 6/11  . GERD (gastroesophageal reflux disease)   . Hypercholesteremia   . Hypertension   . Kidney stone 6/11   rt. on ct 6/11  . Osteoporosis   . Paroxysmal atrial fibrillation ([place])    a. PVI 10/13 b. PVI 12/15   Past Surgical History:  Procedure Laterality Date  . ATRIAL FIBRILLATION ABLATION N/A 06/07/2012   PVI and CTI ablation by Dr Rayann Heman  . ATRIAL FIBRILLATION ABLATION N/A 07/31/2014   PVI by Dr Rayann Heman; initial ablation 2013  . CARDIOVERSION  05/06/2012   Procedure: CARDIOVERSION;  Surgeon: Sueanne Margarita, MD;  [facility] END[facility];  Service: Cardiovascular;  Laterality: N/A;  h/p in file drawer  . CARDIOVERSION N/A 10/06/2017   Procedure: CARDIOVERSION;  Surgeon: Skeet Latch, MD;  Location[facility] ENDOSCOPY;  Service: Cardiovascular;  Laterality: N/A;  . CARDIOVERSION N/A 03/28/2018   Procedure: CARDIOVERSION;  Surgeon: Dorothy Spark, MD;  Location: [facility] ENDOSCOPY;  Service:  Cardiovascular;  Laterality: N/A;  . CESAREAN SECTION  [phone]   x-2  . CHOLECYSTECTOMY  1989  . ELECTROPHYSIOLOGIC STUDY N/A 09/08/2016   Procedure: Cardioversion;  Surgeon: Thompson Grayer, MD;  Location: [place] CV LAB;  Service: Cardiovascular;  Laterality: N/A;  . EP IMPLANTABLE DEVICE N/A 09/08/2016   Procedure: Loop Recorder Removal;  Surgeon: Thompson Grayer, MD;  Location: [place] CV LAB;  Service: Cardiovascular;  Laterality: N/A;  . EP IMPLANTABLE DEVICE N/A 09/08/2016   Procedure: Pacemaker Implant;  Surgeon: Thompson Grayer, MD;  Location: [place] CV LAB;  Service: Cardiovascular;  Laterality: N/A;  . Implantable loop recorder implantation  05/30/14   Medtronic Reveal LINQ implanted by Dr Rayann Heman with RIO II protocol (ambulatory setting)  . TEE WITHOUT CARDIOVERSION  06/06/2012   Procedure: TRANSESOPHAGEAL ECHOCARDIOGRAM (TEE);  Surgeon: Jettie Booze, MD;  Location: [place];  Service: Cardiovascular;  Laterality: N/A;  . TEE WITHOUT CARDIOVERSION N/A 07/30/2014   Procedure: TRANSESOPHAGEAL ECHOCARDIOGRAM (TEE);  Surgeon: Larey Dresser, MD;  Location: [place];  Service: Cardiovascular;  Laterality: N/A;  . TEE WITHOUT CARDIOVERSION N/A 10/06/2017   Procedure: TRANSESOPHAGEAL ECHOCARDIOGRAM (TEE);  Surgeon: Skeet Latch, MD;  Location: [place];  Service: Cardiovascular;  Laterality: N/A;  . TEE WITHOUT CARDIOVERSION N/A 03/28/2018   Procedure: TRANSESOPHAGEAL ECHOCARDIOGRAM (TEE);  Surgeon: Dorothy Spark, MD;  Location: [facility] ENDOSCOPY;  Service: Cardiovascular;  Laterality: N/A;    Current Outpatient Medications  Medication Sig Dispense Refill  . acetaminophen (TYLENOL)  500 MG tablet Take 500-1,000 mg by mouth every 6 (six) hours as needed for moderate pain or headache.     . calcium carbonate (OS-CAL) 600 MG TABS tablet Take 600 mg by mouth 2 (two) times daily with a meal.     . cholecalciferol (VITAMIN D) 1000 UNITS tablet Take 1,000 Units by  mouth every evening.     Marland Kitchen losartan (COZAAR) 100 MG tablet Take 1 tablet (100 mg total) by mouth daily. 90 tablet 3  . metoprolol tartrate (LOPRESSOR) 25 MG tablet TAKE 1 & 1/2 TABLETS BY MOUTH TWICE A DAY (Patient taking differently: Take 37.5 mg by mouth 2 (two) times daily. ) 270 tablet 2  . pantoprazole (PROTONIX) 40 MG tablet Take 40 mg by mouth daily.    . pravastatin (PRAVACHOL) 20 MG tablet Take 20 mg by mouth daily.  1  . sotalol (BETAPACE) 80 MG tablet TAKE 1 TABLET (80 MG TOTAL) BY MOUTH 2 (TWO) TIMES DAILY. 180 tablet 2  . traZODone (DESYREL) 50 MG tablet Take 25 mg by mouth at bedtime.     Marland Kitchen warfarin (COUMADIN) 5 MG tablet TAKE AS DIRECTED BY THE COUMADIN CLINIC (Patient taking differently: Take 2.5 mg by mouth daily. ) 30 tablet 3  . amLODipine (NORVASC) 2.5 MG tablet Take 1 tablet (2.5 mg total) by mouth daily. 135 tablet 3   No current facility-administered medications for this encounter.     Allergies  Allergen Reactions  . Morphine And Related Nausea And Vomiting  . Codeine Nausea And Vomiting    Social History   Socioeconomic History  . Marital status: Widowed    Spouse name: Not on file  . Number of children: Not on file  . Years of education: Not on file  . Highest education level: Not on file  Occupational History  . Not on file  Social Needs  . Financial resource strain: Not on file  . Food insecurity:    Worry: Not on file    Inability: Not on file  . Transportation needs:    Medical: Not on file    Non-medical: Not on file  Tobacco Use  . Smoking status: Never Smoker  . Smokeless tobacco: Never Used  Substance and Sexual Activity  . Alcohol use: No  . Drug use: No  . Sexual activity: Not on file  Lifestyle  . Physical activity:    Days per week: Not on file    Minutes per session: Not on file  . Stress: Not on file  Relationships  . Social connections:    Talks on phone: Not on file    Gets together: Not on file    Attends religious  service: Not on file    Active member of club or organization: Not on file    Attends meetings of clubs or organizations: Not on file    Relationship status: Not on file  . Intimate partner violence:    Fear of current or ex partner: Not on file    Emotionally abused: Not on file    Physically abused: Not on file    Forced sexual activity: Not on file  Other Topics Concern  . 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   [place]   Marital Status:single,widowed,2006.    Family History  Problem Relation Age of Onset  . Heart attack Father   . CAD Father   . Hip fracture Mother  complication  . Lung cancer Brother   . Heart attack Brother        massive  . Other Brother        mva  . Thyroid cancer Sister   . Atrial fibrillation Sister   . Ovarian cancer Sister        surviver    ROS- All systems are reviewed and negative except as per the HPI above  Physical Exam: Vitals:   04/06/18 1429  BP: 108/60  Pulse: 63  Weight: 70.6 kg  Height: 5\' 3"  (1.6 m)   Wt Readings from Last 3 Encounters:  04/06/18 70.6 kg  03/28/18 70.3 kg  03/22/18 70.3 kg    Labs: Lab Results  Component Value Date   NA 136 03/22/2018   K 4.9 03/22/2018   CL 102 03/22/2018   CO2 28 03/22/2018   GLUCOSE 98 03/22/2018   BUN 20 03/22/2018   CREATININE 0.96 03/22/2018   CALCIUM 9.5 03/22/2018   MG 2.2 11/18/2017   Lab Results  Component Value Date   INR 3.0 03/28/2018   No results found for: CHOL, HDL, LDLCALC, TRIG   GEN- The patient is well appearing, alert and oriented x 3 today.   Head- normocephalic, atraumatic Eyes-  Sclera clear, conjunctiva pink Ears- hearing intact Oropharynx- clear Neck- supple, no JVP Lymph- no cervical lymphadenopathy Lungs- Clear to ausculation bilaterally, normal work of breathing Heart- irregular rate and rhythm, no murmurs, rubs or gallops, PMI not laterally  displaced GI- soft, NT, ND, + BS Extremities- no clubbing, cyanosis, or edema MS- no significant deformity or atrophy Skin- no rash or lesion Psych- euthymic mood, full affect Neuro- strength and sensation are intact  EKG- AV paced rate at 63 bpm, qrs int 70 ms, qtc 435 ms Epic records reviewed    Assessment and Plan: 1. Afib  Successful cardioversion 09/2017 Continue sotalol 80 mg bid  Continue metoprolol bid Continue warfarin for a chadsvasc score of at least 4 She is not interested in changing antiarrythmic's at this point   2. HTN States improvement in drops of BP with rearranging times of pills She would like to take 25 mg of metoprolol  every 8 hours instead  of 1 1/2 tab bid, I am OK with that Try not to hold am  BB dose, as this may trigger afib May be able to get by not using amlodipine daily but only if BP spikes   3. PPM Per device clinic  F/u  with Chanetta Marshall 10/10   Geroge Baseman. Mikiah Demond, [facility] [address] [place], [place] [address] [phone]

## 2018-04-11 ENCOUNTER — Ambulatory Visit: Payer: Medicare Other

## 2018-04-11 DIAGNOSIS — I4891 Unspecified atrial fibrillation: Secondary | ICD-10-CM | POA: Diagnosis not present

## 2018-04-11 DIAGNOSIS — Z5181 Encounter for therapeutic drug level monitoring: Secondary | ICD-10-CM

## 2018-04-11 LAB — POCT INR: INR: 2.3 (ref 2.0–3.0)

## 2018-04-11 NOTE — Patient Instructions (Signed)
Description   Continue taking 1/2 tablet (2.5mg ) daily.  Recheck INR in 6 weeks on 05/26/18.

## 2018-05-15 ENCOUNTER — Other Ambulatory Visit: Payer: Self-pay | Admitting: Cardiology

## 2018-05-16 MED ORDER — WARFARIN SODIUM 5 MG PO TABS: ORAL_TABLET | ORAL | 1 refills | Status: DC

## 2018-05-16 NOTE — Addendum Note (Signed)
Addended by: Derrel Nip B on: 05/16/2018 03:03 PM   Modules accepted: Orders

## 2018-05-16 NOTE — Addendum Note (Signed)
Addended by: SUPPLE, MEGAN E on: 05/16/2018 02:39 PM   Modules accepted: Orders

## 2018-05-25 NOTE — Progress Notes (Signed)
Electrophysiology Office Note Date: 05/26/2018  ID:  Monica [name], DOB 1941-08-06, MRN [id number]  PCP: Monica Ada, MD Electrophysiologist: Monica [name]  CC: Pacemaker follow-up  Monica [name] is a 77 y.o. female seen today for Dr Monica [name].  She presents today for routine electrophysiology followup.  Since last being seen in our clinic, the patient reports doing relatively well.  She still has days with increased fatigue.  She had recurrent persistent AF 03/2018 and underwent TEE guided cardioversion. Since then, she has not been aware of episodes of atrial fibrillation but does have good days and bad days.  She denies chest pain, palpitations, dyspnea, PND, orthopnea, nausea, vomiting, dizziness, syncope, edema, weight gain, or early satiety.  Device History: MDT dual chamber PPM implanted 2018 for tachy/brady   Past Medical History:  Diagnosis Date  . Adrenal adenoma 6/11   lt on ct 6/11  . GERD (gastroesophageal reflux disease)   . Hypercholesteremia   . Hypertension   . Kidney stone 6/11   rt. on ct 6/11  . Osteoporosis   . Paroxysmal atrial fibrillation ([place])    a. PVI 10/13 b. PVI 12/15   Past Surgical History:  Procedure Laterality Date  . ATRIAL FIBRILLATION ABLATION N/A 06/07/2012   PVI and CTI ablation by Dr Monica [name]  . ATRIAL FIBRILLATION ABLATION N/A 07/31/2014   PVI by Dr Monica [name]; initial ablation 2013  . CARDIOVERSION  05/06/2012   Procedure: CARDIOVERSION;  Surgeon: Monica Margarita, MD;  Location: [facility] ENDOSCOPY;  Service: Cardiovascular;  Laterality: N/A;  h/p in file drawer  . CARDIOVERSION N/A 10/06/2017   Procedure: CARDIOVERSION;  Surgeon: Monica Latch, MD;  Location: [facility] ENDOSCOPY;  Service: Cardiovascular;  Laterality: N/A;  . CARDIOVERSION N/A 03/28/2018   Procedure: CARDIOVERSION;  Surgeon: Monica Spark, MD;  Location: [facility] ENDOSCOPY;  Service: Cardiovascular;  Laterality: N/A;  . CESAREAN SECTION  [phone]   x-2  . CHOLECYSTECTOMY  1989  .  ELECTROPHYSIOLOGIC STUDY N/A 09/08/2016   Procedure: Cardioversion;  Surgeon: Monica Grayer, MD;  Location: [place] CV LAB;  Service: Cardiovascular;  Laterality: N/A;  . EP IMPLANTABLE DEVICE N/A 09/08/2016   Procedure: Loop Recorder Removal;  Surgeon: Monica Grayer, MD;  Location: [place] CV LAB;  Service: Cardiovascular;  Laterality: N/A;  . EP IMPLANTABLE DEVICE N/A 09/08/2016   Procedure: Pacemaker Implant;  Surgeon: Monica Grayer, MD;  Location: [place] CV LAB;  Service: Cardiovascular;  Laterality: N/A;  . Implantable loop recorder implantation  05/30/14   Medtronic Reveal LINQ implanted by Dr Monica [name] with RIO II protocol (ambulatory setting)  . TEE WITHOUT CARDIOVERSION  06/06/2012   Procedure: TRANSESOPHAGEAL ECHOCARDIOGRAM (TEE);  Surgeon: Monica Booze, MD;  Location: [place];  Service: Cardiovascular;  Laterality: N/A;  . TEE WITHOUT CARDIOVERSION N/A 07/30/2014   Procedure: TRANSESOPHAGEAL ECHOCARDIOGRAM (TEE);  Surgeon: Monica Dresser, MD;  Location: [place];  Service: Cardiovascular;  Laterality: N/A;  . TEE WITHOUT CARDIOVERSION N/A 10/06/2017   Procedure: TRANSESOPHAGEAL ECHOCARDIOGRAM (TEE);  Surgeon: Monica Latch, MD;  Location: [place];  Service: Cardiovascular;  Laterality: N/A;  . TEE WITHOUT CARDIOVERSION N/A 03/28/2018   Procedure: TRANSESOPHAGEAL ECHOCARDIOGRAM (TEE);  Surgeon: Monica Spark, MD;  Location: [facility] ENDOSCOPY;  Service: Cardiovascular;  Laterality: N/A;    Current Outpatient Medications  Medication Sig Dispense Refill  . acetaminophen (TYLENOL) 500 MG tablet Take 500-1,000 mg by mouth every 6 (six) hours as needed for moderate pain or headache.     Marland Kitchen amLODipine (NORVASC) 2.5 MG  tablet Take 1 tablet (2.5 mg total) by mouth daily. 135 tablet 3  . calcium carbonate (OS-CAL) 600 MG TABS tablet Take 600 mg by mouth 2 (two) times daily with a meal.     . cholecalciferol (VITAMIN D) 1000 UNITS tablet Take 1,000 Units by mouth  every evening.     Marland Kitchen losartan (COZAAR) 100 MG tablet Take 1 tablet (100 mg total) by mouth daily. 90 tablet 3  . metoprolol tartrate (LOPRESSOR) 25 MG tablet TAKE 1 & 1/2 TABLETS BY MOUTH TWICE A DAY 270 tablet 2  . pantoprazole (PROTONIX) 40 MG tablet Take 40 mg by mouth daily.    . pravastatin (PRAVACHOL) 20 MG tablet Take 20 mg by mouth daily.  1  . sotalol (BETAPACE) 80 MG tablet TAKE 1 TABLET (80 MG TOTAL) BY MOUTH 2 (TWO) TIMES DAILY. 180 tablet 2  . traZODone (DESYREL) 50 MG tablet Take 25 mg by mouth at bedtime.     Marland Kitchen warfarin (COUMADIN) 5 MG tablet Take 1/2 tablet daily or as directed by the Coumadin clinic 50 tablet 1   No current facility-administered medications for this visit.     Allergies:   Morphine and related and Codeine   Social History: Social History   Socioeconomic History  . Marital status: Widowed    Spouse name: Not on file  . Number of children: Not on file  . Years of education: Not on file  . Highest education level: Not on file  Occupational History  . Not on file  Social Needs  . Financial resource strain: Not on file  . Food insecurity:    Worry: Not on file    Inability: Not on file  . Transportation needs:    Medical: Not on file    Non-medical: Not on file  Tobacco Use  . Smoking status: Never Smoker  . Smokeless tobacco: Never Used  Substance and Sexual Activity  . Alcohol use: No  . Drug use: No  . Sexual activity: Not on file  Lifestyle  . Physical activity:    Days per week: Not on file    Minutes per session: Not on file  . Stress: Not on file  Relationships  . Social connections:    Talks on phone: Not on file    Gets together: Not on file    Attends religious service: Not on file    Active member of club or organization: Not on file    Attends meetings of clubs or organizations: Not on file    Relationship status: Not on file  . Intimate partner violence:    Fear of current or ex partner: Not on file    Emotionally  abused: Not on file    Physically abused: Not on file    Forced sexual activity: Not on file  Other Topics Concern  . 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   [employer]   Marital Status:single,widowed,2006.    Family History: Family History  Problem Relation Age of Onset  . Heart attack Father   . CAD Father   . Hip fracture Mother        complication  . Lung cancer Brother   . Heart attack Brother        massive  . Other Brother        mva  . Thyroid cancer Sister   . Atrial fibrillation Sister   . Ovarian cancer Sister  surviver     Review of Systems: All other systems reviewed and are otherwise negative except as noted above.   Physical Exam: VS:  BP 124/86   Pulse 67   Ht 5\' 3"  (1.6 m)   Wt 154 lb 1.9 oz (69.9 kg)   SpO2 97%   BMI 27.30 kg/m  , BMI Body mass index is 27.3 kg/m.  GEN- The patient is elderly appearing, alert and oriented x 3 today.   HEENT: normocephalic, atraumatic; sclera clear, conjunctiva pink; hearing intact; oropharynx clear; neck supple  Lungs- Clear to ausculation bilaterally, normal work of breathing.  No wheezes, rales, rhonchi Heart- Regular rate and rhythm  GI- soft, non-tender, non-distended, bowel sounds present  Extremities- no clubbing, cyanosis, or edema  MS- no significant deformity or atrophy Skin- warm and dry, no rash or lesion; PPM pocket well healed Psych- euthymic mood, full affect Neuro- strength and sensation are intact  PPM Interrogation- reviewed in detail today,  See PACEART report  EKG:  EKG is ordered today. The ekg ordered today shows atrial pacing, rate 67, QTc 435msec  Recent Labs: 11/18/2017: Magnesium 2.2 03/22/2018: BUN 20; Creatinine, Ser 0.96; Hemoglobin 12.7; Platelets 297; Potassium 4.9; Sodium 136   Wt Readings from Last 3 Encounters:  05/26/18 154 lb 1.9 oz (69.9 kg)  04/06/18 155 lb 9.6 oz (70.6 kg)    03/28/18 155 lb (70.3 kg)     Other studies Reviewed: Additional studies/ records that were reviewed today include: AF clinic notes, hospital records   Assessment and Plan:  1.  Symptomatic bradycardia Normal PPM function See Pace Art report No changes today  2.  Persistent atrial fibrillation AF burden by device interrogation 9.3% V rates controlled Continue Sotalol. QTc stable today Continue Warfarin for CHADS2VASC of 3 BMET, Mg today We discussed treatment options if she has recurrent AF. I think a trial of Tikosyn would be reasonable. She does have moderate MR - would need to be kept in consideration if we think about ablation   3.  HTN Stable No change required today    Current medicines are reviewed at length with the patient today.   The patient does not have concerns regarding her medicines.  The following changes were made today:  none  Labs/ tests ordered today include: BMET, Mg Orders Placed This Encounter  Procedures  . Basic Metabolic Panel (BMET)  . Magnesium  . EKG 12-Lead     Disposition:   Follow up with [facility], me in 6 months    Signed, Chanetta Marshall, NP 05/26/2018 10:44 AM  [facility] [place] [address] [phone] (office) [phone] (fax)

## 2018-05-26 ENCOUNTER — Encounter: Payer: Self-pay | Admitting: Nurse Practitioner

## 2018-05-26 ENCOUNTER — Ambulatory Visit (INDEPENDENT_AMBULATORY_CARE_PROVIDER_SITE_OTHER): Payer: Medicare Other | Admitting: Pharmacist

## 2018-05-26 ENCOUNTER — Ambulatory Visit: Payer: Medicare Other | Admitting: Nurse Practitioner

## 2018-05-26 VITALS — BP 124/86 | HR 67 | Ht 63.0 in | Wt 154.1 lb

## 2018-05-26 DIAGNOSIS — I4891 Unspecified atrial fibrillation: Secondary | ICD-10-CM | POA: Diagnosis not present

## 2018-05-26 DIAGNOSIS — I4819 Other persistent atrial fibrillation: Secondary | ICD-10-CM

## 2018-05-26 DIAGNOSIS — R001 Bradycardia, unspecified: Secondary | ICD-10-CM

## 2018-05-26 DIAGNOSIS — I10 Essential (primary) hypertension: Secondary | ICD-10-CM | POA: Diagnosis not present

## 2018-05-26 DIAGNOSIS — Z5181 Encounter for therapeutic drug level monitoring: Secondary | ICD-10-CM | POA: Diagnosis not present

## 2018-05-26 LAB — BASIC METABOLIC PANEL
BUN / CREAT RATIO: 19 (ref 12–28)
BUN: 19 mg/dL (ref 8–27)
CHLORIDE: 99 mmol/L (ref 96–106)
CO2: 24 mmol/L (ref 20–29)
Calcium: 10 mg/dL (ref 8.7–10.3)
Creatinine, Ser: 0.99 mg/dL (ref 0.57–1.00)
GFR calc Af Amer: 63 mL/min/{1.73_m2} (ref >59)
GFR calc non Af Amer: 55 mL/min/{1.73_m2} — ABNORMAL LOW (ref >59)
GLUCOSE: 75 mg/dL (ref 65–99)
Potassium: 4.7 mmol/L (ref 3.5–5.2)
Sodium: 137 mmol/L (ref 134–144)

## 2018-05-26 LAB — CUP PACEART INCLINIC DEVICE CHECK
Date Time Interrogation Session: 20191010111130
Implantable Lead Implant Date: 20180123
Implantable Lead Location: 753859
Implantable Lead Model: 5076
Implantable Pulse Generator Implant Date: 20180123
MDC IDC LEAD IMPLANT DT: 20180123
MDC IDC LEAD LOCATION: 753860

## 2018-05-26 LAB — POCT INR: INR: 2.3 (ref 2.0–3.0)

## 2018-05-26 LAB — MAGNESIUM: MAGNESIUM: 2.1 mg/dL (ref 1.6–2.3)

## 2018-05-26 NOTE — Patient Instructions (Addendum)
Medication Instructions:  Your physician recommends that you continue on your current medications as directed. Please refer to the Current Medication list given to you today.  -- If you need a refill on your cardiac medications before your next appointment, please call your pharmacy. --  Labwork:TODAY BMET MAGNESIUM  Testing/Procedures: None ordered  Follow-Up: Remote monitoring is used to monitor your Pacemaker from home. This monitoring reduces the number of office visits required to check your device to one time per year. It allows [place] to keep an eye on the functioning of your device to ensure it is working properly. You are scheduled for a device check from home on 06/06/18. You may send your transmission at any time that day. If you have a wireless device, the transmission will be sent automatically. After your physician reviews your transmission, you will receive a postcard with your next transmission date.   Your physician wants you to follow-up in: 6 months with  [place] will receive a reminder letter in the mail two months in advance. If you don't receive a letter, please call our office to schedule the follow-up appointment.  Thank you for choosing [facility]!!    Any Other Special Instructions Will Be Listed Below (If Applicable).

## 2018-05-26 NOTE — Patient Instructions (Signed)
Description   Continue taking 1/2 tablet (2.5mg ) daily.  Recheck INR in 6 weeks.

## 2018-05-27 ENCOUNTER — Telehealth: Payer: Self-pay

## 2018-05-27 NOTE — Telephone Encounter (Signed)
-----   Message from Patsey Berthold, NP sent at 05/26/2018  6:09 PM EDT ----- Please notify patient of stable labs. Thanks!

## 2018-05-27 NOTE — Telephone Encounter (Signed)
Notes recorded by Frederik Schmidt, RN on 05/27/2018 at 10:46 AM EDT The patient has been notified of the result and verbalized understanding. All questions (if any) were answered. Frederik Schmidt, RN 05/27/2018 10:46 AM

## 2018-06-06 ENCOUNTER — Ambulatory Visit (INDEPENDENT_AMBULATORY_CARE_PROVIDER_SITE_OTHER): Payer: Medicare Other | Admitting: *Deleted

## 2018-06-06 DIAGNOSIS — I4891 Unspecified atrial fibrillation: Secondary | ICD-10-CM

## 2018-06-07 ENCOUNTER — Encounter: Payer: Self-pay | Admitting: Cardiology

## 2018-06-07 NOTE — Progress Notes (Signed)
Remote pacemaker transmission.   

## 2018-06-25 ENCOUNTER — Other Ambulatory Visit: Payer: Self-pay | Admitting: Internal Medicine

## 2018-06-28 LAB — CUP PACEART REMOTE DEVICE CHECK
Battery Voltage: 3.01 V
Brady Statistic AP VP Percent: 0.05 %
Brady Statistic RA Percent Paced: 87.94 %
Brady Statistic RV Percent Paced: 1.29 %
Implantable Lead Implant Date: 20180123
Implantable Lead Model: 5076
Implantable Lead Model: 5076
Implantable Pulse Generator Implant Date: 20180123
Lead Channel Impedance Value: 342 Ohm
Lead Channel Impedance Value: 456 Ohm
Lead Channel Impedance Value: 494 Ohm
Lead Channel Pacing Threshold Amplitude: 0.75 V
Lead Channel Sensing Intrinsic Amplitude: 2 mV
Lead Channel Sensing Intrinsic Amplitude: 2 mV
Lead Channel Sensing Intrinsic Amplitude: 8.75 mV
Lead Channel Setting Pacing Amplitude: 2 V
Lead Channel Setting Pacing Pulse Width: 0.4 ms
MDC IDC LEAD IMPLANT DT: 20180123
MDC IDC LEAD LOCATION: 753859
MDC IDC LEAD LOCATION: 753860
MDC IDC MSMT BATTERY REMAINING LONGEVITY: 93 mo
MDC IDC MSMT LEADCHNL RA PACING THRESHOLD PULSEWIDTH: 0.4 ms
MDC IDC MSMT LEADCHNL RV IMPEDANCE VALUE: 665 Ohm
MDC IDC MSMT LEADCHNL RV PACING THRESHOLD AMPLITUDE: 0.875 V
MDC IDC MSMT LEADCHNL RV PACING THRESHOLD PULSEWIDTH: 0.4 ms
MDC IDC MSMT LEADCHNL RV SENSING INTR AMPL: 8.75 mV
MDC IDC SESS DTM: 20191021151536
MDC IDC SET LEADCHNL RV PACING AMPLITUDE: 2.5 V
MDC IDC SET LEADCHNL RV SENSING SENSITIVITY: 2 mV
MDC IDC STAT BRADY AP VS PERCENT: 89.36 %
MDC IDC STAT BRADY AS VP PERCENT: 1.24 %
MDC IDC STAT BRADY AS VS PERCENT: 9.36 %

## 2018-07-07 ENCOUNTER — Ambulatory Visit: Payer: Medicare Other | Admitting: *Deleted

## 2018-07-07 DIAGNOSIS — Z5181 Encounter for therapeutic drug level monitoring: Secondary | ICD-10-CM | POA: Diagnosis not present

## 2018-07-07 DIAGNOSIS — I4891 Unspecified atrial fibrillation: Secondary | ICD-10-CM

## 2018-07-07 LAB — POCT INR: INR: 1.9 — AB (ref 2.0–3.0)

## 2018-07-07 NOTE — Patient Instructions (Signed)
Description   Today take 1 tablet, then Continue taking 1/2 tablet (2.5mg ) daily.  Recheck INR in 4 weeks.

## 2018-07-18 ENCOUNTER — Other Ambulatory Visit (HOSPITAL_COMMUNITY): Payer: Self-pay | Admitting: Nurse Practitioner

## 2018-08-04 ENCOUNTER — Ambulatory Visit: Payer: Medicare Other | Admitting: Pharmacist

## 2018-08-04 DIAGNOSIS — Z5181 Encounter for therapeutic drug level monitoring: Secondary | ICD-10-CM | POA: Diagnosis not present

## 2018-08-04 DIAGNOSIS — I4891 Unspecified atrial fibrillation: Secondary | ICD-10-CM | POA: Diagnosis not present

## 2018-08-04 LAB — POCT INR: INR: 2.2 (ref 2.0–3.0)

## 2018-08-04 NOTE — Patient Instructions (Signed)
Description   Continue taking 1/2 tablet (2.5mg ) daily.  Recheck INR in 4 weeks.

## 2018-09-01 ENCOUNTER — Ambulatory Visit: Payer: Medicare Other

## 2018-09-01 DIAGNOSIS — Z5181 Encounter for therapeutic drug level monitoring: Secondary | ICD-10-CM

## 2018-09-01 DIAGNOSIS — I4891 Unspecified atrial fibrillation: Secondary | ICD-10-CM | POA: Diagnosis not present

## 2018-09-01 LAB — POCT INR: INR: 2.5 (ref 2.0–3.0)

## 2018-09-01 NOTE — Patient Instructions (Signed)
Description   Continue taking 1/2 tablet (2.5mg ) daily.  Recheck INR in 5 weeks.

## 2018-09-05 ENCOUNTER — Ambulatory Visit (INDEPENDENT_AMBULATORY_CARE_PROVIDER_SITE_OTHER): Payer: Medicare Other

## 2018-09-05 DIAGNOSIS — R001 Bradycardia, unspecified: Secondary | ICD-10-CM

## 2018-09-05 DIAGNOSIS — I4891 Unspecified atrial fibrillation: Secondary | ICD-10-CM

## 2018-09-06 ENCOUNTER — Telehealth: Payer: Self-pay

## 2018-09-06 LAB — CUP PACEART REMOTE DEVICE CHECK
Battery Remaining Longevity: 91 mo
Brady Statistic AP VP Percent: 0.06 %
Brady Statistic AP VS Percent: 88.28 %
Brady Statistic AS VP Percent: 0.98 %
Brady Statistic RV Percent Paced: 1.01 %
Date Time Interrogation Session: 20200120224203
Implantable Lead Implant Date: 20180123
Implantable Lead Location: 753859
Implantable Lead Location: 753860
Implantable Pulse Generator Implant Date: 20180123
Lead Channel Impedance Value: 323 Ohm
Lead Channel Impedance Value: 456 Ohm
Lead Channel Impedance Value: 456 Ohm
Lead Channel Impedance Value: 589 Ohm
Lead Channel Pacing Threshold Amplitude: 0.75 V
Lead Channel Pacing Threshold Pulse Width: 0.4 ms
Lead Channel Pacing Threshold Pulse Width: 0.4 ms
Lead Channel Sensing Intrinsic Amplitude: 1.875 mV
Lead Channel Sensing Intrinsic Amplitude: 1.875 mV
Lead Channel Sensing Intrinsic Amplitude: 7.875 mV
Lead Channel Sensing Intrinsic Amplitude: 7.875 mV
Lead Channel Setting Pacing Amplitude: 2.5 V
Lead Channel Setting Pacing Pulse Width: 0.4 ms
Lead Channel Setting Sensing Sensitivity: 2 mV
MDC IDC LEAD IMPLANT DT: 20180123
MDC IDC MSMT BATTERY VOLTAGE: 3.01 V
MDC IDC MSMT LEADCHNL RV PACING THRESHOLD AMPLITUDE: 0.75 V
MDC IDC SET LEADCHNL RA PACING AMPLITUDE: 2 V
MDC IDC STAT BRADY AS VS PERCENT: 10.68 %
MDC IDC STAT BRADY RA PERCENT PACED: 86.73 %

## 2018-09-06 NOTE — Progress Notes (Signed)
Remote pacemaker transmission.   

## 2018-09-06 NOTE — Telephone Encounter (Signed)
   [facility] Pre-operative Risk Assessment    Request for surgical clearance:  1. What type of surgery is being performed? Colonoscopy for positive cologuard   2. When is this surgery scheduled? TBD   3. What type of clearance is required (medical clearance vs. Pharmacy clearance to hold med vs. Both)? Pharmacy  4. Are there any medications that need to be held prior to surgery and how long? Coumadin   5. Practice name and name of physician performing surgery? [facility]    6. What is your office phone number [phone]    7.   What is your office fax number [phone]  8.   Anesthesia type (None, local, MAC, general) ? Not listed   Monica [name] 09/06/2018, 9:36 AM  _________________________________________________________________   (provider comments below)

## 2018-09-09 NOTE — Telephone Encounter (Signed)
Patient with diagnosis of atrial fibrillation on warfarin for anticoagulation.    Procedure: colonoscopy Date of procedure: TBD  CHADS2-VASc score of  4 (, HTN, AGE, , AGE, female)  CrCl 51.7 Platelet count 297  Per office protocol, patient can hold warfarin for 5 days prior to procedure.    Patient will not need bridging with Lovenox (enoxaparin) around procedure.  Patient should restart warfarin on the evening of procedure or day after, at discretion of procedure MD

## 2018-09-09 NOTE — Telephone Encounter (Addendum)
   Primary Cardiologist: Thompson Grayer, MD  Chart reviewed as part of pre-operative protocol coverage.   Per pharmacy recommendations, patient may hold her coumadin 5 days prior to her upcoming colonoscopy and does not require bridging with lovenox. She was recommended to restart coumadin on the evening of the procedure or day after at the discretion of Dr. Penelope Coop.   I will route this recommendation to the requesting party via Epic fax function and remove from pre-op pool.  Please call with questions.  Abigail Butts, PA-C 09/09/2018, 3:09 PM

## 2018-09-12 ENCOUNTER — Telehealth: Payer: Self-pay | Admitting: Internal Medicine

## 2018-09-12 NOTE — Telephone Encounter (Signed)
   Primary Cardiologist: Thompson Grayer, MD  Chart reviewed as part of pre-operative protocol coverage. Error corrected and re-faxed to Dr. Estell Harpin office.   Abigail Butts, PA-C 09/12/2018, 10:38 AM

## 2018-09-12 NOTE — Telephone Encounter (Signed)
New message     Monica [name] from Dr. Penelope Coop office [facility] stated that  pt surgical clearance that pt is an female  and in notes states that its an female.  She just wants to make sure we have correct pt. Please follow up with her

## 2018-09-15 ENCOUNTER — Other Ambulatory Visit: Payer: Self-pay | Admitting: Gastroenterology

## 2018-09-28 ENCOUNTER — Ambulatory Visit (HOSPITAL_COMMUNITY)
Admission: RE | Admit: 2018-09-28 | Discharge: 2018-09-28 | Disposition: A | Discharge status: Home / Self Care | Payer: Medicare Other | Source: Ambulatory Visit | Attending: Nurse Practitioner | Admitting: Nurse Practitioner

## 2018-09-28 ENCOUNTER — Encounter (HOSPITAL_COMMUNITY): Payer: Self-pay | Admitting: Nurse Practitioner

## 2018-09-28 VITALS — BP 122/84 | HR 99 | Ht 63.0 in | Wt 157.6 lb

## 2018-09-28 DIAGNOSIS — Z886 Allergy status to analgesic agent status: Secondary | ICD-10-CM | POA: Insufficient documentation

## 2018-09-28 DIAGNOSIS — Z800 Family history of malignant neoplasm of digestive organs: Secondary | ICD-10-CM | POA: Insufficient documentation

## 2018-09-28 DIAGNOSIS — M810 Age-related osteoporosis without current pathological fracture: Secondary | ICD-10-CM | POA: Diagnosis not present

## 2018-09-28 DIAGNOSIS — R9431 Abnormal electrocardiogram [ECG] [EKG]: Secondary | ICD-10-CM | POA: Insufficient documentation

## 2018-09-28 DIAGNOSIS — I4819 Other persistent atrial fibrillation: Secondary | ICD-10-CM | POA: Diagnosis not present

## 2018-09-28 DIAGNOSIS — I480 Paroxysmal atrial fibrillation: Secondary | ICD-10-CM | POA: Insufficient documentation

## 2018-09-28 DIAGNOSIS — E7800 Pure hypercholesterolemia, unspecified: Secondary | ICD-10-CM | POA: Diagnosis not present

## 2018-09-28 DIAGNOSIS — Z9049 Acquired absence of other specified parts of digestive tract: Secondary | ICD-10-CM | POA: Diagnosis not present

## 2018-09-28 DIAGNOSIS — Z8249 Family history of ischemic heart disease and other diseases of the circulatory system: Secondary | ICD-10-CM | POA: Diagnosis not present

## 2018-09-28 DIAGNOSIS — Z801 Family history of malignant neoplasm of trachea, bronchus and lung: Secondary | ICD-10-CM | POA: Insufficient documentation

## 2018-09-28 DIAGNOSIS — Z8041 Family history of malignant neoplasm of ovary: Secondary | ICD-10-CM | POA: Diagnosis not present

## 2018-09-28 DIAGNOSIS — Z7901 Long term (current) use of anticoagulants: Secondary | ICD-10-CM | POA: Insufficient documentation

## 2018-09-28 DIAGNOSIS — K219 Gastro-esophageal reflux disease without esophagitis: Secondary | ICD-10-CM | POA: Insufficient documentation

## 2018-09-28 DIAGNOSIS — I10 Essential (primary) hypertension: Secondary | ICD-10-CM | POA: Diagnosis not present

## 2018-09-28 DIAGNOSIS — Z885 Allergy status to narcotic agent status: Secondary | ICD-10-CM | POA: Diagnosis not present

## 2018-09-28 DIAGNOSIS — Z79899 Other long term (current) drug therapy: Secondary | ICD-10-CM | POA: Insufficient documentation

## 2018-09-28 LAB — BASIC METABOLIC PANEL
ANION GAP: 8 (ref 5–15)
BUN: 21 mg/dL (ref 8–23)
CO2: 25 mmol/L (ref 22–32)
Calcium: 9.7 mg/dL (ref 8.9–10.3)
Chloride: 104 mmol/L (ref 98–111)
Creatinine, Ser: 1.11 mg/dL — ABNORMAL HIGH (ref 0.44–1.00)
GFR calc Af Amer: 55 mL/min — ABNORMAL LOW (ref >60)
GFR, EST NON AFRICAN AMERICAN: 48 mL/min — AB (ref >60)
Glucose, Bld: 136 mg/dL — ABNORMAL HIGH (ref 70–99)
Potassium: 5.6 mmol/L — ABNORMAL HIGH (ref 3.5–5.1)
Sodium: 137 mmol/L (ref 135–145)

## 2018-09-28 LAB — CBC
HCT: 42.3 % (ref 36.0–46.0)
Hemoglobin: 13.3 g/dL (ref 12.0–15.0)
MCH: 27.9 pg (ref 26.0–34.0)
MCHC: 31.4 g/dL (ref 30.0–36.0)
MCV: 88.9 fL (ref 80.0–100.0)
Platelets: 289 10*3/uL (ref 150–400)
RBC: 4.76 MIL/uL (ref 3.87–5.11)
RDW: 14.6 % (ref 11.5–15.5)
WBC: 5.3 10*3/uL (ref 4.0–10.5)
nRBC: 0 % (ref 0.0–0.2)

## 2018-09-28 LAB — MAGNESIUM: Magnesium: 2.3 mg/dL (ref 1.7–2.4)

## 2018-09-28 NOTE — Progress Notes (Signed)
Primary Care Physician: Carol Ada, MD Referring Physician: Dr. Lisette Abu LAILANA Monica [name] is a 78 y.o. 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 She has noted afib since Saturday am. She is pending cataract surgery 2/17 and again in a month. She also is pending a colonoscopy 3/3.  No missed doses of sotalol 80 mg bid. She had a visit in the device clinic in October and her afib burden at that point was 9.3%. it was discussed if she had recurrent afib then a trial of Tikosyn would be reasonable. However, with pt's pending procedures, it would be hard to hospitalize for Tikosyn load right now.  Today, she denies symptoms of  chest pain, shortness of breath, orthopnea, PND, lower extremity edema, dizziness, presyncope, syncope, or neurologic sequela. The patient is tolerating medications without difficulties and is otherwise without complaint today.   Past Medical History:  Diagnosis Date  . Adrenal adenoma 6/11   lt on ct 6/11  . GERD (gastroesophageal reflux disease)   . Hypercholesteremia   . Hypertension   . Kidney stone 6/11   rt. on ct 6/11  . Osteoporosis   . Paroxysmal atrial fibrillation ([place])    a. PVI 10/13 b. PVI 12/15   Past Surgical History:  Procedure Laterality Date  . ATRIAL FIBRILLATION ABLATION N/A 06/07/2012   PVI and CTI ablation by Dr Rayann Heman  . ATRIAL FIBRILLATION ABLATION N/A 07/31/2014   PVI by Dr Rayann Heman; initial ablation 2013  . CARDIOVERSION  05/06/2012   Procedure: CARDIOVERSION;  Surgeon: Sueanne Margarita, MD;  Location: [facility] ENDOSCOPY;  Service: Cardiovascular;  Laterality: N/A;  h/p in file drawer  . CARDIOVERSION N/A 10/06/2017   Procedure: CARDIOVERSION;  Surgeon: Skeet Latch, MD;  Location: [facility] ENDOSCOPY;  Service: Cardiovascular;  Laterality: N/A;  . CARDIOVERSION N/A 03/28/2018   Procedure: CARDIOVERSION;  Surgeon: Dorothy Spark, MD;  Location: [facility] ENDOSCOPY;  Service: Cardiovascular;  Laterality: N/A;  .  CESAREAN SECTION  [phone]   x-2  . CHOLECYSTECTOMY  1989  . ELECTROPHYSIOLOGIC STUDY N/A 09/08/2016   Procedure: Cardioversion;  Surgeon: Thompson Grayer, MD;  Location: [place] CV LAB;  Service: Cardiovascular;  Laterality: N/A;  . EP IMPLANTABLE DEVICE N/A 09/08/2016   Procedure: Loop Recorder Removal;  Surgeon: Thompson Grayer, MD;  Location: [place] CV LAB;  Service: Cardiovascular;  Laterality: N/A;  . EP IMPLANTABLE DEVICE N/A 09/08/2016   Procedure: Pacemaker Implant;  Surgeon: Thompson Grayer, MD;  Location: [place] CV LAB;  Service: Cardiovascular;  Laterality: N/A;  . Implantable loop recorder implantation  05/30/14   Medtronic Reveal LINQ implanted by Dr Rayann Heman with RIO II protocol (ambulatory setting)  . TEE WITHOUT CARDIOVERSION  06/06/2012   Procedure: TRANSESOPHAGEAL ECHOCARDIOGRAM (TEE);  Surgeon: Jettie Booze, MD;  Location: [place];  Service: Cardiovascular;  Laterality: N/A;  . TEE WITHOUT CARDIOVERSION N/A 07/30/2014   Procedure: TRANSESOPHAGEAL ECHOCARDIOGRAM (TEE);  Surgeon: Larey Dresser, MD;  Location: [place];  Service: Cardiovascular;  Laterality: N/A;  . TEE WITHOUT CARDIOVERSION N/A 10/06/2017   Procedure: TRANSESOPHAGEAL ECHOCARDIOGRAM (TEE);  Surgeon: Skeet Latch, MD;  Location: [place];  Service: Cardiovascular;  Laterality: N/A;  . TEE WITHOUT CARDIOVERSION N/A 03/28/2018   Procedure: TRANSESOPHAGEAL ECHOCARDIOGRAM (TEE);  Surgeon: Dorothy Spark, MD;  Location: [facility] ENDOSCOPY;  Service: Cardiovascular;  Laterality: N/A;    Current Outpatient Medications  Medication Sig Dispense Refill  . acetaminophen (TYLENOL) 500 MG tablet Take 500-1,000 mg by  mouth every 6 (six) hours as needed for moderate pain or headache.     Marland Kitchen amLODipine (NORVASC) 2.5 MG tablet TAKE 1 TABLET BY MOUTH EVERY DAY 90 tablet 3  . calcium carbonate (OS-CAL) 600 MG TABS tablet Take 600 mg by mouth 2 (two) times daily with a meal.     . cholecalciferol  (VITAMIN D) 1000 UNITS tablet Take 1,000 Units by mouth every evening.     Marland Kitchen losartan (COZAAR) 100 MG tablet Take 1 tablet (100 mg total) by mouth daily. 90 tablet 3  . metoprolol tartrate (LOPRESSOR) 25 MG tablet TAKE 1 & 1/2 TABLETS BY MOUTH TWICE A DAY 270 tablet 2  . pantoprazole (PROTONIX) 40 MG tablet Take 40 mg by mouth daily.    . pravastatin (PRAVACHOL) 20 MG tablet Take 20 mg by mouth daily.  1  . sotalol (BETAPACE) 80 MG tablet TAKE 1 TABLET (80 MG TOTAL) BY MOUTH 2 (TWO) TIMES DAILY. 180 tablet 2  . traZODone (DESYREL) 50 MG tablet Take 25 mg by mouth at bedtime.     Marland Kitchen warfarin (COUMADIN) 5 MG tablet Take 1/2 tablet daily or as directed by the Coumadin clinic 50 tablet 1   No current facility-administered medications for this encounter.     Allergies  Allergen Reactions  . Morphine And Related Nausea And Vomiting  . Codeine Nausea And Vomiting    Social History   Socioeconomic History  . Marital status: Widowed    Spouse name: Not on file  . Number of children: Not on file  . Years of education: Not on file  . Highest education level: Not on file  Occupational History  . Not on file  Social Needs  . Financial resource strain: Not on file  . Food insecurity:    Worry: Not on file    Inability: Not on file  . Transportation needs:    Medical: Not on file    Non-medical: Not on file  Tobacco Use  . Smoking status: Never Smoker  . Smokeless tobacco: Never Used  Substance and Sexual Activity  . Alcohol use: No  . Drug use: No  . Sexual activity: Not on file  Lifestyle  . Physical activity:    Days per week: Not on file    Minutes per session: Not on file  . Stress: Not on file  Relationships  . Social connections:    Talks on phone: Not on file    Gets together: Not on file    Attends religious service: Not on file    Active member of club or organization: Not on file    Attends meetings of clubs or organizations: Not on file    Relationship status: Not  on file  . Intimate partner violence:    Fear of current or ex partner: Not on file    Emotionally abused: Not on file    Physically abused: Not on file    Forced sexual activity: Not on file  Other Topics Concern  . 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   [place]   Marital Status:single,widowed,2006.    Family History  Problem Relation Age of Onset  . Heart attack Father   . CAD Father   . Hip fracture Mother        complication  . Lung cancer Brother   . Heart attack Brother        massive  . Other Brother  mva  . Thyroid cancer Sister   . Atrial fibrillation Sister   . Ovarian cancer Sister        surviver    ROS- All systems are reviewed and negative except as per the HPI above  Physical Exam: Vitals:   09/28/18 1059  BP: 122/84  Pulse: 99  Weight: 71.5 kg  Height: 5\' 3"  (1.6 m)   Wt Readings from Last 3 Encounters:  09/28/18 71.5 kg  05/26/18 69.9 kg  04/06/18 70.6 kg    Labs: Lab Results  Component Value Date   NA 137 05/26/2018   K 4.7 05/26/2018   CL 99 05/26/2018   CO2 24 05/26/2018   GLUCOSE 75 05/26/2018   BUN 19 05/26/2018   CREATININE 0.99 05/26/2018   CALCIUM 10.0 05/26/2018   MG 2.1 05/26/2018   Lab Results  Component Value Date   INR 2.5 09/01/2018   No results found for: CHOL, HDL, LDLCALC, TRIG   GEN- The patient is well appearing, alert and oriented x 3 today.   Head- normocephalic, atraumatic Eyes-  Sclera clear, conjunctiva pink Ears- hearing intact Oropharynx- clear Neck- supple, no JVP Lymph- no cervical lymphadenopathy Lungs- Clear to ausculation bilaterally, normal work of breathing Heart- irregular rate and rhythm, no murmurs, rubs or gallops, PMI not laterally displaced GI- soft, NT, ND, + BS Extremities- no clubbing, cyanosis, or edema MS- no significant deformity or atrophy Skin- no rash or lesion Psych- euthymic mood,  full affect Neuro- strength and sensation are intact  EKG- A fib at 99 bpm, qrs int 76 bpm, qtc 459 ms Epic records reviewed    Assessment and Plan: 1. Afib In afib since Saturday, 2/8 Continue sotalol 80 mg bid  Continue metoprolol bid Continue warfarin for a chadsvasc score of at least 4 Pt would like to restore SR before cataract surgery, 2/17. She is not required to stop anticoagulation for this surgery but  will have to reschedule the colonoscopy 3/3 for one month following cardioversion.  If she has ERAF, changing to Tikosyn is reasonable  Will schedule forTEE cardioversion on Friday 2/14 as she is on warfarin, subtherapeutic reading noted in November  INR day of cardioversion, at coumadin clinic  Bmet/cbc/mag today  2. HTN Stable today, occasionally will note a low BP reading for which she will hold BB   3. PPM Per device clinic  F/u one week after [place] C. Dorella Laster, [facility] [address] [place], [place] [address] [phone]

## 2018-09-28 NOTE — H&P (View-Only) (Signed)
Primary Care Physician: Carol Ada, MD Referring Physician: Dr. Lisette Abu Monica [name] is a 78 y.o. 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 She has noted afib since Saturday am. She is pending cataract surgery 2/17 and again in a month. She also is pending a colonoscopy 3/3.  No missed doses of sotalol 80 mg bid. She had a visit in the device clinic in October and her afib burden at that point was 9.3%. it was discussed if she had recurrent afib then a trial of Tikosyn would be reasonable. However, with pt's pending procedures, it would be hard to hospitalize for Tikosyn load right now.  Today, she denies symptoms of  chest pain, shortness of breath, orthopnea, PND, lower extremity edema, dizziness, presyncope, syncope, or neurologic sequela. The patient is tolerating medications without difficulties and is otherwise without complaint today.   Past Medical History:  Diagnosis Date  . Adrenal adenoma 6/11   lt on ct 6/11  . GERD (gastroesophageal reflux disease)   . Hypercholesteremia   . Hypertension   . Kidney stone 6/11   rt. on ct 6/11  . Osteoporosis   . Paroxysmal atrial fibrillation ([place])    a. PVI 10/13 b. PVI 12/15   Past Surgical History:  Procedure Laterality Date  . ATRIAL FIBRILLATION ABLATION N/A 06/07/2012   PVI and CTI ablation by Dr Rayann Heman  . ATRIAL FIBRILLATION ABLATION N/A 07/31/2014   PVI by Dr Rayann Heman; initial ablation 2013  . CARDIOVERSION  05/06/2012   Procedure: CARDIOVERSION;  Surgeon: Sueanne Margarita, MD;  Location: [facility] ENDOSCOPY;  Service: Cardiovascular;  Laterality: N/A;  h/p in file drawer  . CARDIOVERSION N/A 10/06/2017   Procedure: CARDIOVERSION;  Surgeon: Skeet Latch, MD;  Location: [facility] ENDOSCOPY;  Service: Cardiovascular;  Laterality: N/A;  . CARDIOVERSION N/A 03/28/2018   Procedure: CARDIOVERSION;  Surgeon: Dorothy Spark, MD;  Location: [facility] ENDOSCOPY;  Service: Cardiovascular;  Laterality: N/A;  .  CESAREAN SECTION  [phone]   x-2  . CHOLECYSTECTOMY  1989  . ELECTROPHYSIOLOGIC STUDY N/A 09/08/2016   Procedure: Cardioversion;  Surgeon: Thompson Grayer, MD;  Location: [place] CV LAB;  Service: Cardiovascular;  Laterality: N/A;  . EP IMPLANTABLE DEVICE N/A 09/08/2016   Procedure: Loop Recorder Removal;  Surgeon: Thompson Grayer, MD;  Location: [place] CV LAB;  Service: Cardiovascular;  Laterality: N/A;  . EP IMPLANTABLE DEVICE N/A 09/08/2016   Procedure: Pacemaker Implant;  Surgeon: Thompson Grayer, MD;  Location: [place] CV LAB;  Service: Cardiovascular;  Laterality: N/A;  . Implantable loop recorder implantation  05/30/14   Medtronic Reveal LINQ implanted by Dr Rayann Heman with RIO II protocol (ambulatory setting)  . TEE WITHOUT CARDIOVERSION  06/06/2012   Procedure: TRANSESOPHAGEAL ECHOCARDIOGRAM (TEE);  Surgeon: Jettie Booze, MD;  Location: [place];  Service: Cardiovascular;  Laterality: N/A;  . TEE WITHOUT CARDIOVERSION N/A 07/30/2014   Procedure: TRANSESOPHAGEAL ECHOCARDIOGRAM (TEE);  Surgeon: Larey Dresser, MD;  Location: [place];  Service: Cardiovascular;  Laterality: N/A;  . TEE WITHOUT CARDIOVERSION N/A 10/06/2017   Procedure: TRANSESOPHAGEAL ECHOCARDIOGRAM (TEE);  Surgeon: Skeet Latch, MD;  Location: [place];  Service: Cardiovascular;  Laterality: N/A;  . TEE WITHOUT CARDIOVERSION N/A 03/28/2018   Procedure: TRANSESOPHAGEAL ECHOCARDIOGRAM (TEE);  Surgeon: Dorothy Spark, MD;  Location: [facility] ENDOSCOPY;  Service: Cardiovascular;  Laterality: N/A;    Current Outpatient Medications  Medication Sig Dispense Refill  . acetaminophen (TYLENOL) 500 MG tablet Take 500-1,000 mg by  mouth every 6 (six) hours as needed for moderate pain or headache.     Marland Kitchen amLODipine (NORVASC) 2.5 MG tablet TAKE 1 TABLET BY MOUTH EVERY DAY 90 tablet 3  . calcium carbonate (OS-CAL) 600 MG TABS tablet Take 600 mg by mouth 2 (two) times daily with a meal.     . cholecalciferol  (VITAMIN D) 1000 UNITS tablet Take 1,000 Units by mouth every evening.     Marland Kitchen losartan (COZAAR) 100 MG tablet Take 1 tablet (100 mg total) by mouth daily. 90 tablet 3  . metoprolol tartrate (LOPRESSOR) 25 MG tablet TAKE 1 & 1/2 TABLETS BY MOUTH TWICE A DAY 270 tablet 2  . pantoprazole (PROTONIX) 40 MG tablet Take 40 mg by mouth daily.    . pravastatin (PRAVACHOL) 20 MG tablet Take 20 mg by mouth daily.  1  . sotalol (BETAPACE) 80 MG tablet TAKE 1 TABLET (80 MG TOTAL) BY MOUTH 2 (TWO) TIMES DAILY. 180 tablet 2  . traZODone (DESYREL) 50 MG tablet Take 25 mg by mouth at bedtime.     Marland Kitchen warfarin (COUMADIN) 5 MG tablet Take 1/2 tablet daily or as directed by the Coumadin clinic 50 tablet 1   No current facility-administered medications for this encounter.     Allergies  Allergen Reactions  . Morphine And Related Nausea And Vomiting  . Codeine Nausea And Vomiting    Social History   Socioeconomic History  . Marital status: Widowed    Spouse name: Not on file  . Number of children: Not on file  . Years of education: Not on file  . Highest education level: Not on file  Occupational History  . Not on file  Social Needs  . Financial resource strain: Not on file  . Food insecurity:    Worry: Not on file    Inability: Not on file  . Transportation needs:    Medical: Not on file    Non-medical: Not on file  Tobacco Use  . Smoking status: Never Smoker  . Smokeless tobacco: Never Used  Substance and Sexual Activity  . Alcohol use: No  . Drug use: No  . Sexual activity: Not on file  Lifestyle  . Physical activity:    Days per week: Not on file    Minutes per session: Not on file  . Stress: Not on file  Relationships  . Social connections:    Talks on phone: Not on file    Gets together: Not on file    Attends religious service: Not on file    Active member of club or organization: Not on file    Attends meetings of clubs or organizations: Not on file    Relationship status: Not  on file  . Intimate partner violence:    Fear of current or ex partner: Not on file    Emotionally abused: Not on file    Physically abused: Not on file    Forced sexual activity: Not on file  Other Topics Concern  . 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   [place]   Marital Status:single,widowed,2006.    Family History  Problem Relation Age of Onset  . Heart attack Father   . CAD Father   . Hip fracture Mother        complication  . Lung cancer Brother   . Heart attack Brother        massive  . Other Brother  mva  . Thyroid cancer Sister   . Atrial fibrillation Sister   . Ovarian cancer Sister        surviver    ROS- All systems are reviewed and negative except as per the HPI above  Physical Exam: Vitals:   09/28/18 1059  BP: 122/84  Pulse: 99  Weight: 71.5 kg  Height: 5\' 3"  (1.6 m)   Wt Readings from Last 3 Encounters:  09/28/18 71.5 kg  05/26/18 69.9 kg  04/06/18 70.6 kg    Labs: Lab Results  Component Value Date   NA 137 05/26/2018   K 4.7 05/26/2018   CL 99 05/26/2018   CO2 24 05/26/2018   GLUCOSE 75 05/26/2018   BUN 19 05/26/2018   CREATININE 0.99 05/26/2018   CALCIUM 10.0 05/26/2018   MG 2.1 05/26/2018   Lab Results  Component Value Date   INR 2.5 09/01/2018   No results found for: CHOL, HDL, LDLCALC, TRIG   GEN- The patient is well appearing, alert and oriented x 3 today.   Head- normocephalic, atraumatic Eyes-  Sclera clear, conjunctiva pink Ears- hearing intact Oropharynx- clear Neck- supple, no JVP Lymph- no cervical lymphadenopathy Lungs- Clear to ausculation bilaterally, normal work of breathing Heart- irregular rate and rhythm, no murmurs, rubs or gallops, PMI not laterally displaced GI- soft, NT, ND, + BS Extremities- no clubbing, cyanosis, or edema MS- no significant deformity or atrophy Skin- no rash or lesion Psych- euthymic mood,  full affect Neuro- strength and sensation are intact  EKG- A fib at 99 bpm, qrs int 76 bpm, qtc 459 ms Epic records reviewed    Assessment and Plan: 1. Afib In afib since Saturday, 2/8 Continue sotalol 80 mg bid  Continue metoprolol bid Continue warfarin for a chadsvasc score of at least 4 Pt would like to restore SR before cataract surgery, 2/17. She is not required to stop anticoagulation for this surgery but  will have to reschedule the colonoscopy 3/3 for one month following cardioversion.  If she has ERAF, changing to Tikosyn is reasonable  Will schedule forTEE cardioversion on Friday 2/14 as she is on warfarin, subtherapeutic reading noted in November  INR day of cardioversion, at coumadin clinic  Bmet/cbc/mag today  2. HTN Stable today, occasionally will note a low BP reading for which she will hold BB   3. PPM Per device clinic  F/u one week after [place] C. Derrel Moore, [facility] [address] [place], [place] [address] [phone]

## 2018-09-28 NOTE — Patient Instructions (Signed)
Cardioversion scheduled for Friday, February 14th  -Have INR check at [address] office at [place] at the [employer] and go to admitting at [place] not eat or drink anything after midnight the night prior to your procedure.  - Take all your morning medication with a sip of water prior to arrival.  - You will not be able to drive home after your procedure.

## 2018-09-29 ENCOUNTER — Other Ambulatory Visit (HOSPITAL_COMMUNITY): Payer: Self-pay | Admitting: *Deleted

## 2018-09-29 MED ORDER — LOSARTAN POTASSIUM 100 MG PO TABS: 50.0000 mg | ORAL_TABLET | Freq: Every day | ORAL | 3 refills | Status: DC

## 2018-09-30 ENCOUNTER — Ambulatory Visit (HOSPITAL_COMMUNITY)
Admission: RE | Admit: 2018-09-30 | Discharge: 2018-09-30 | Disposition: A | Discharge status: Home / Self Care | Payer: Medicare Other | Attending: Internal Medicine | Admitting: Internal Medicine

## 2018-09-30 ENCOUNTER — Ambulatory Visit (HOSPITAL_BASED_OUTPATIENT_CLINIC_OR_DEPARTMENT_OTHER): Payer: Medicare Other

## 2018-09-30 ENCOUNTER — Other Ambulatory Visit: Payer: Self-pay

## 2018-09-30 ENCOUNTER — Encounter (HOSPITAL_COMMUNITY): Payer: Self-pay | Admitting: Certified Registered"

## 2018-09-30 ENCOUNTER — Ambulatory Visit: Payer: Medicare Other | Admitting: Pharmacist

## 2018-09-30 ENCOUNTER — Ambulatory Visit (HOSPITAL_COMMUNITY): Payer: Medicare Other | Admitting: Anesthesiology

## 2018-09-30 ENCOUNTER — Encounter (HOSPITAL_COMMUNITY)
Admission: RE | Disposition: A | Discharge status: Home / Self Care | Payer: Self-pay | Source: Home / Self Care | Attending: Internal Medicine

## 2018-09-30 DIAGNOSIS — I4891 Unspecified atrial fibrillation: Secondary | ICD-10-CM

## 2018-09-30 DIAGNOSIS — Z885 Allergy status to narcotic agent status: Secondary | ICD-10-CM | POA: Diagnosis not present

## 2018-09-30 DIAGNOSIS — I480 Paroxysmal atrial fibrillation: Secondary | ICD-10-CM | POA: Diagnosis not present

## 2018-09-30 DIAGNOSIS — Z79899 Other long term (current) drug therapy: Secondary | ICD-10-CM | POA: Diagnosis not present

## 2018-09-30 DIAGNOSIS — I513 Intracardiac thrombosis, not elsewhere classified: Secondary | ICD-10-CM | POA: Diagnosis not present

## 2018-09-30 DIAGNOSIS — K219 Gastro-esophageal reflux disease without esophagitis: Secondary | ICD-10-CM | POA: Insufficient documentation

## 2018-09-30 DIAGNOSIS — Z5181 Encounter for therapeutic drug level monitoring: Secondary | ICD-10-CM | POA: Diagnosis not present

## 2018-09-30 DIAGNOSIS — I10 Essential (primary) hypertension: Secondary | ICD-10-CM | POA: Diagnosis not present

## 2018-09-30 DIAGNOSIS — Z7901 Long term (current) use of anticoagulants: Secondary | ICD-10-CM | POA: Insufficient documentation

## 2018-09-30 DIAGNOSIS — E7800 Pure hypercholesterolemia, unspecified: Secondary | ICD-10-CM | POA: Insufficient documentation

## 2018-09-30 HISTORY — PX: TEE WITHOUT CARDIOVERSION: SHX5443

## 2018-09-30 LAB — POCT INR: INR: 2.8 (ref 2.0–3.0)

## 2018-09-30 SURGERY — ECHOCARDIOGRAM, TRANSESOPHAGEAL
Anesthesia: General

## 2018-09-30 MED ORDER — PROPOFOL 500 MG/50ML IV EMUL
INTRAVENOUS | Status: DC | PRN
  Administered 2018-09-30: 150 ug/kg/min via INTRAVENOUS

## 2018-09-30 MED ORDER — SODIUM CHLORIDE 0.9 % IV SOLN: INTRAVENOUS | Status: DC

## 2018-09-30 MED ORDER — BUTAMBEN-TETRACAINE-BENZOCAINE 2-2-14 % EX AERO
INHALATION_SPRAY | CUTANEOUS | Status: DC | PRN
  Administered 2018-09-30: 2 via TOPICAL

## 2018-09-30 MED ORDER — LACTATED RINGERS IV SOLN
INTRAVENOUS | Status: DC
  Administered 2018-09-30: 11:00:00 via INTRAVENOUS

## 2018-09-30 NOTE — Patient Instructions (Signed)
Description   Continue taking 1/2 tablet (2.5mg ) daily.  Recheck INR in 1 week after cardioversion.

## 2018-09-30 NOTE — CV Procedure (Signed)
INDICATIONS: atrial fibrillation  PROCEDURE:   Informed consent was obtained prior to the procedure. The risks, benefits and alternatives for the procedure were discussed and the patient comprehended these risks.  Risks include, but are not limited to, cough, sore throat, vomiting, nausea, somnolence, esophageal and stomach trauma or perforation, bleeding, low blood pressure, aspiration, pneumonia, infection, trauma to the teeth and death.    After a procedural time-out, the oropharynx was anesthetized with 20% benzocaine spray.   During this procedure the patient was administered propofol per anesthesia to achieve and maintain moderate sedation.  The patient's heart rate, blood pressure, and oxygen saturationweare monitored continuously during the procedure. The period of conscious sedation was 35 minutes, of which I was present face-to-face 100% of this time.  The transesophageal probe was inserted in the esophagus and stomach without difficulty and multiple views were obtained.  The patient was kept under observation until the patient left the procedure room.  The patient left the procedure room in stable condition.   Agitated microbubble saline contrast was not administered.  COMPLICATIONS:    There were no immediate complications.  FINDINGS:  Suspicion for left atrial appendage thrombus.   RECOMMENDATIONS:    Will NOT proceed to cardioversion. F/u arranged in afib clinic on Tuesday and continue coumadin  Time Spent Directly with the Patient:  75 minutes   Taisley Mordan A Lache Dagher 09/30/2018, 1:21 PM

## 2018-09-30 NOTE — Progress Notes (Signed)
  Echocardiogram Echocardiogram Transesophageal has been performed.  Monica [name] 09/30/2018, 1:25 PM

## 2018-09-30 NOTE — Anesthesia Preprocedure Evaluation (Addendum)
Anesthesia Evaluation  Patient identified by MRN, date of birth, ID band Patient awake    Reviewed: Allergy & Precautions, NPO status , Patient's Chart, lab work & pertinent test results  Airway Mallampati: II  TM Distance: >3 FB Neck ROM: Full    Dental no notable dental hx.    Pulmonary neg pulmonary ROS,    Pulmonary exam normal breath sounds clear to auscultation       Cardiovascular hypertension, Pt. on medications and Pt. on home beta blockers Normal cardiovascular exam+ dysrhythmias Atrial Fibrillation  Rhythm:Regular Rate:Normal  ECG: a-fib, rate 99   Neuro/Psych negative neurological ROS  negative psych ROS   GI/Hepatic Neg liver ROS, GERD  Medicated and Controlled,  Endo/Other  negative endocrine ROS  Renal/GU negative Renal ROS     Musculoskeletal negative musculoskeletal ROS (+)   Abdominal   Peds  Hematology HLD   Anesthesia Other Findings a fib  Reproductive/Obstetrics                            Anesthesia Physical Anesthesia Plan  ASA: IV  Anesthesia Plan: General   Post-op Pain Management:    Induction: Intravenous  PONV Risk Score and Plan: 3 and Propofol infusion and Treatment may vary due to age or medical condition  Airway Management Planned: Nasal Cannula  Additional Equipment:   Intra-op Plan:   Post-operative Plan:   Informed Consent: I have reviewed the patients History and Physical, chart, labs and discussed the procedure including the risks, benefits and alternatives for the proposed anesthesia with the patient or authorized representative who has indicated his/her understanding and acceptance.     Dental advisory given  Plan Discussed with: CRNA  Anesthesia Plan Comments:         Anesthesia Quick Evaluation

## 2018-09-30 NOTE — Interval H&P Note (Signed)
History and Physical Interval Note:  09/30/2018 11:50 AM  Monica [name]  has presented today for surgery, with the diagnosis of a fib  The various methods of treatment have been discussed with the patient and family. After consideration of risks, benefits and other options for treatment, the patient has consented to  Procedure(s): TRANSESOPHAGEAL ECHOCARDIOGRAM (TEE) (N/A) CARDIOVERSION (N/A) as a surgical intervention .  The patient's history has been reviewed, patient examined, no change in status, stable for surgery.  I have reviewed the patient's chart and labs.  Questions were answered to the patient's satisfaction.     Elouise Munroe

## 2018-09-30 NOTE — Transfer of Care (Signed)
Immediate Anesthesia Transfer of Care Note  Patient: Monica [name]  Procedure(s) Performed: TRANSESOPHAGEAL ECHOCARDIOGRAM (TEE) (N/A )  Patient Location: Endoscopy Unit  Anesthesia Type:MAC  Level of Consciousness: awake, alert , oriented and patient cooperative  Airway & Oxygen Therapy: Patient Spontanous Breathing and Patient connected to nasal cannula oxygen  Post-op Assessment: Report given to RN and Post -op Vital signs reviewed and stable  Post vital signs: Reviewed and stable  Last Vitals:  Vitals Value Taken Time  BP 125/67 09/30/2018 12:50 PM  Temp    Pulse 95 09/30/2018 12:50 PM  Resp 21 09/30/2018 12:50 PM  SpO2 99 % 09/30/2018 12:50 PM  Vitals shown include unvalidated device data.  Last Pain:  Vitals:   09/30/18 1250  TempSrc: Oral  PainSc: 0-No pain         Complications: No apparent anesthesia complications

## 2018-09-30 NOTE — Anesthesia Postprocedure Evaluation (Signed)
Anesthesia Post Note  Patient: Monica [name]  Procedure(s) Performed: TRANSESOPHAGEAL ECHOCARDIOGRAM (TEE) (N/A )     Patient location during evaluation: PACU Anesthesia Type: MAC Level of consciousness: awake and alert Pain management: pain level controlled Vital Signs Assessment: post-procedure vital signs reviewed and stable Respiratory status: spontaneous breathing, nonlabored ventilation, respiratory function stable and patient connected to nasal cannula oxygen Cardiovascular status: stable and blood pressure returned to baseline Postop Assessment: no apparent nausea or vomiting Anesthetic complications: no    Last Vitals:  Vitals:   09/30/18 1300 09/30/18 1310  BP: (!) 142/80 (!) 142/83  Pulse: (!) 105 (!) 104  Resp: 19 18  Temp:    SpO2: 100% 100%    Last Pain:  Vitals:   09/30/18 1310  TempSrc:   PainSc: 0-No pain                 Ryan P Ellender

## 2018-09-30 NOTE — Discharge Instructions (Signed)

## 2018-10-02 ENCOUNTER — Encounter (HOSPITAL_COMMUNITY): Payer: Self-pay | Admitting: Internal Medicine

## 2018-10-03 ENCOUNTER — Ambulatory Visit: Payer: Self-pay | Admitting: Pharmacist

## 2018-10-03 ENCOUNTER — Telehealth: Payer: Self-pay | Admitting: Pharmacist

## 2018-10-03 DIAGNOSIS — Z5181 Encounter for therapeutic drug level monitoring: Secondary | ICD-10-CM

## 2018-10-03 DIAGNOSIS — I4819 Other persistent atrial fibrillation: Secondary | ICD-10-CM

## 2018-10-03 NOTE — Telephone Encounter (Signed)
-----   Message from Sueanne Margarita, MD sent at 10/03/2018  2:17 PM EST ----- Suezanne Jacquet,   Please order a cardiac CT with contrast with the diagnosis being lpossible eft atrial thrombus for further evaluation with CT to document whether this is truly a thrombus in the left atrial appendage as there was a question on TEE.  In the meantime Megan please increase INR goal to 2.5-3.5.  Fransico Him ----- Message ----- From: Leeroy Bock, [facility] Sent: 10/03/2018  11:42 AM EST To: Sueanne Margarita, MD  Pt on therapeutic warfarin with TEE showing suspicion for left atrial appendage thrombus. I'll increase her warfarin dose to target INR 2.5-3.5.  Thanks, [employer]

## 2018-10-04 ENCOUNTER — Encounter (HOSPITAL_COMMUNITY): Payer: Self-pay | Admitting: Nurse Practitioner

## 2018-10-04 ENCOUNTER — Ambulatory Visit (HOSPITAL_COMMUNITY)
Admission: RE | Admit: 2018-10-04 | Discharge: 2018-10-04 | Disposition: A | Discharge status: Home / Self Care | Payer: Medicare Other | Source: Ambulatory Visit | Attending: Nurse Practitioner | Admitting: Nurse Practitioner

## 2018-10-04 VITALS — BP 142/82 | HR 110 | Ht 63.0 in | Wt 157.0 lb

## 2018-10-04 DIAGNOSIS — Z8249 Family history of ischemic heart disease and other diseases of the circulatory system: Secondary | ICD-10-CM | POA: Insufficient documentation

## 2018-10-04 DIAGNOSIS — I10 Essential (primary) hypertension: Secondary | ICD-10-CM | POA: Insufficient documentation

## 2018-10-04 DIAGNOSIS — E7800 Pure hypercholesterolemia, unspecified: Secondary | ICD-10-CM | POA: Diagnosis not present

## 2018-10-04 DIAGNOSIS — I4819 Other persistent atrial fibrillation: Secondary | ICD-10-CM

## 2018-10-04 DIAGNOSIS — Z823 Family history of stroke: Secondary | ICD-10-CM | POA: Diagnosis not present

## 2018-10-04 DIAGNOSIS — Z950 Presence of cardiac pacemaker: Secondary | ICD-10-CM | POA: Diagnosis not present

## 2018-10-04 DIAGNOSIS — Z7901 Long term (current) use of anticoagulants: Secondary | ICD-10-CM | POA: Diagnosis not present

## 2018-10-04 DIAGNOSIS — Z79899 Other long term (current) drug therapy: Secondary | ICD-10-CM | POA: Diagnosis not present

## 2018-10-04 DIAGNOSIS — Z87442 Personal history of urinary calculi: Secondary | ICD-10-CM | POA: Insufficient documentation

## 2018-10-04 DIAGNOSIS — K219 Gastro-esophageal reflux disease without esophagitis: Secondary | ICD-10-CM | POA: Insufficient documentation

## 2018-10-04 DIAGNOSIS — Z86018 Personal history of other benign neoplasm: Secondary | ICD-10-CM | POA: Insufficient documentation

## 2018-10-04 DIAGNOSIS — I480 Paroxysmal atrial fibrillation: Secondary | ICD-10-CM | POA: Diagnosis not present

## 2018-10-04 MED ORDER — METOPROLOL TARTRATE 25 MG PO TABS: 50.0000 mg | ORAL_TABLET | Freq: Two times a day (BID) | ORAL | 2 refills | Status: DC

## 2018-10-04 NOTE — Addendum Note (Signed)
Encounter addended by: Sherran Needs, NP on: 10/04/2018 12:17 PM  Actions taken: Clinical Note Signed

## 2018-10-04 NOTE — Addendum Note (Signed)
Encounter addended by: Sherran Needs, NP on: 10/04/2018 12:15 PM  Actions taken: Clinical Note Signed

## 2018-10-04 NOTE — Progress Notes (Addendum)
Primary Care Physician: Monica Ada, MD Referring Physician: Dr. Lisette [name] Monica [name] is a 78 y.o. 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 She has noted afib since Saturday am. She is pending cataract surgery 2/17 and again in a month. She also is pending a colonoscopy 3/3.  No missed doses of sotalol 80 mg bid. She had a visit in the device clinic in October and her afib burden at that point was 9.3%. it was discussed if she had recurrent afib then a trial of Tikosyn would be reasonable. However, with pt's pending procedures, it would be hard to hospitalize for Tikosyn load right now.  2/18-F/u TEE guided cardioversion 2/14. 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 Monica [name], PharmD,  is aware and dicussed with Monica [name]. The plan is to increase her INR to  2.5- 3.5 and Monica [name] wants a cardiac CT performed. Therefore, pt will have to live in afib until the clot issue is resolved. Will try to increase BB to slow her down a bit. She was able to have her rt cataract  removed yesterday.  Today, she denies symptoms of  chest pain, shortness of breath, orthopnea, PND, lower extremity edema, dizziness, presyncope, syncope, or neurologic sequela. The patient is tolerating medications without difficulties and is otherwise without complaint today.   Past Medical History:  Diagnosis Date  . Adrenal adenoma 6/11   lt on ct 6/11  . GERD (gastroesophageal reflux disease)   . Hypercholesteremia   . Hypertension   . Kidney stone 6/11   rt. on ct 6/11  . Osteoporosis   . Paroxysmal atrial fibrillation (Monica [name])    a. PVI 10/13 b. PVI 12/15   Past Surgical History:  Procedure Laterality Date  . ATRIAL FIBRILLATION ABLATION N/A 06/07/2012   PVI and CTI ablation by Dr Monica [name]  . ATRIAL FIBRILLATION ABLATION N/A 07/31/2014   PVI by Dr Monica [name];  initial ablation 2013  . CARDIOVERSION  05/06/2012   Procedure: CARDIOVERSION;  Surgeon: Monica Margarita, MD;  Location: [facility] ENDOSCOPY;  Service: Cardiovascular;  Laterality: N/A;  h/p in file drawer  . CARDIOVERSION N/A 10/06/2017   Procedure: CARDIOVERSION;  Surgeon: Monica Latch, MD;  Location: [facility] ENDOSCOPY;  Service: Cardiovascular;  Laterality: N/A;  . CARDIOVERSION N/A 03/28/2018   Procedure: CARDIOVERSION;  Surgeon: Monica Spark, MD;  Location: [facility] ENDOSCOPY;  Service: Cardiovascular;  Laterality: N/A;  . CESAREAN SECTION  [phone]   x-2  . CHOLECYSTECTOMY  1989  . ELECTROPHYSIOLOGIC STUDY N/A 09/08/2016   Procedure: Cardioversion;  Surgeon: Monica Grayer, MD;  Location: [place] CV LAB;  Service: Cardiovascular;  Laterality: N/A;  . EP IMPLANTABLE DEVICE N/A 09/08/2016   Procedure: Loop Recorder Removal;  Surgeon: Monica Grayer, MD;  Location: [place] CV LAB;  Service: Cardiovascular;  Laterality: N/A;  . EP IMPLANTABLE DEVICE N/A 09/08/2016   Procedure: Pacemaker Implant;  Surgeon: Monica Grayer, MD;  Location: [place] CV LAB;  Service: Cardiovascular;  Laterality: N/A;  . Implantable loop recorder implantation  05/30/14   Medtronic Reveal LINQ implanted by Dr Monica [name] with RIO II protocol (ambulatory setting)  . TEE WITHOUT CARDIOVERSION  06/06/2012   Procedure: TRANSESOPHAGEAL ECHOCARDIOGRAM (TEE);  Surgeon: Monica Booze, MD;  Location: [place];  Service: Cardiovascular;  Laterality: N/A;  . TEE WITHOUT CARDIOVERSION N/A 07/30/2014   Procedure: TRANSESOPHAGEAL ECHOCARDIOGRAM (TEE);  Surgeon: Monica Dresser, MD;  Location: [place];  Service: Cardiovascular;  Laterality: N/A;  . TEE WITHOUT CARDIOVERSION N/A 10/06/2017   Procedure: TRANSESOPHAGEAL ECHOCARDIOGRAM (TEE);  Surgeon: Monica Latch, MD;  Location: [place];  Service: Cardiovascular;  Laterality: N/A;  . TEE WITHOUT CARDIOVERSION N/A 03/28/2018   Procedure: TRANSESOPHAGEAL ECHOCARDIOGRAM  (TEE);  Surgeon: Monica Spark, MD;  Location: [facility] ENDOSCOPY;  Service: Cardiovascular;  Laterality: N/A;  . TEE WITHOUT CARDIOVERSION N/A 09/30/2018   Procedure: TRANSESOPHAGEAL ECHOCARDIOGRAM (TEE);  Surgeon: Monica Munroe, MD;  Location: [facility] ENDOSCOPY;  Service: Cardiovascular;  Laterality: N/A;    Current Outpatient Medications  Medication Sig Dispense Refill  . acetaminophen (TYLENOL) 500 MG tablet Take 500-1,000 mg by mouth every 6 (six) hours as needed (pain/headaches.).     Marland Kitchen BESIVANCE 0.6 % SUSP Place 1 drop into the right eye 3 (three) times daily.    . calcium carbonate (OS-CAL) 600 MG TABS tablet Take 600 mg by mouth 2 (two) times daily with a meal.     . cholecalciferol (VITAMIN D) 1000 UNITS tablet Take 1,000 Units by mouth every evening.     . DUREZOL 0.05 % EMUL Place 1 drop into the right eye 3 (three) times daily.    Marland Kitchen losartan (COZAAR) 100 MG tablet Take 0.5 tablets (50 mg total) by mouth daily. 90 tablet 3  . metoprolol tartrate (LOPRESSOR) 25 MG tablet Take 2 tablets (50 mg total) by mouth 2 (two) times daily. 270 tablet 2  . pantoprazole (PROTONIX) 40 MG tablet Take 40 mg by mouth daily before breakfast.     . pravastatin (PRAVACHOL) 20 MG tablet Take 20 mg by mouth every evening.   1  . PROLENSA 0.07 % SOLN Place 1 drop into the right eye at bedtime.    . sotalol (BETAPACE) 80 MG tablet TAKE 1 TABLET (80 MG TOTAL) BY MOUTH 2 (TWO) TIMES DAILY. 180 tablet 2  . traZODone (DESYREL) 50 MG tablet Take 25 mg by mouth at bedtime.     Marland Kitchen warfarin (COUMADIN) 5 MG tablet Take 1/2 tablet daily or as directed by the Coumadin clinic (Patient taking differently: Take 2.5 mg by mouth at bedtime. ) 50 tablet 1   No current facility-administered medications for this encounter.     Allergies  Allergen Reactions  . Morphine And Related Nausea And Vomiting  . Codeine Nausea And Vomiting    Social History   Socioeconomic History  . Marital status: Widowed    Spouse name: Not  on file  . Number of children: Not on file  . Years of education: Not on file  . Highest education level: Not on file  Occupational History  . Not on file  Social Needs  . Financial resource strain: Not on file  . Food insecurity:    Worry: Not on file    Inability: Not on file  . Transportation needs:    Medical: Not on file    Non-medical: Not on file  Tobacco Use  . Smoking status: Never Smoker  . Smokeless tobacco: Never Used  Substance and Sexual Activity  . Alcohol use: No  . Drug use: No  . Sexual activity: Not on file  Lifestyle  . Physical activity:    Days per week: Not on file    Minutes per session: Not on file  . Stress: Not on file  Relationships  . Social connections:    Talks on phone: Not on file    Gets together:  Not on file    Attends religious service: Not on file    Active member of club or organization: Not on file    Attends meetings of clubs or organizations: Not on file    Relationship status: Not on file  . Intimate partner violence:    Fear of current or ex partner: Not on file    Emotionally abused: Not on file    Physically abused: Not on file    Forced sexual activity: Not on file  Other Topics Concern  . 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   [place]   Marital Status:single,widowed,2006.    Family History  Problem Relation Age of Onset  . Heart attack Father   . CAD Father   . Hip fracture Mother        complication  . Lung cancer Brother   . Heart attack Brother        massive  . Other Brother        mva  . Thyroid cancer Sister   . Atrial fibrillation Sister   . Ovarian cancer Sister        surviver    ROS- All systems are reviewed and negative except as per the HPI above  Physical Exam: Vitals:   10/04/18 1037  BP: (!) 142/82  Pulse: (!) 110  Weight: 71.2 kg  Height: 5\' 3"  (1.6 m)   Wt Readings from Last 3 Encounters:    10/04/18 71.2 kg  09/30/18 71.5 kg  09/28/18 71.5 kg    Labs: Lab Results  Component Value Date   NA 137 09/28/2018   K 5.6 (H) 09/28/2018   CL 104 09/28/2018   CO2 25 09/28/2018   GLUCOSE 136 (H) 09/28/2018   BUN 21 09/28/2018   CREATININE 1.11 (H) 09/28/2018   CALCIUM 9.7 09/28/2018   MG 2.3 09/28/2018   Lab Results  Component Value Date   INR 2.8 09/30/2018   No results found for: CHOL, HDL, LDLCALC, TRIG   GEN- The patient is well appearing, alert and oriented x 3 today.   Head- normocephalic, atraumatic Eyes-  Sclera clear, conjunctiva pink Ears- hearing intact Oropharynx- clear Neck- supple, no JVP Lymph- no cervical lymphadenopathy Lungs- Clear to ausculation bilaterally, normal work of breathing Heart- irregular rate and rhythm, no murmurs, rubs or gallops, PMI not laterally displaced GI- soft, NT, ND, + BS Extremities- no clubbing, cyanosis, or edema MS- no significant deformity or atrophy Skin- no rash or lesion Psych- euthymic mood, full affect Neuro- strength and sensation are intact  EKG- A fib at 110 bpm, qrs int 72 bpm, qtc 479 ms Epic records reviewed    Assessment and Plan: 1. Afib In afib since Saturday, 2/8 Small  thrombus seen on TEE 2/14 and cardioversion cancelled INR is being addressed by Monica [name], PharmD to keep between 2.5 to 3.5 Monica [name] planning cardiac CT to further evaluate thrombus Continue sotalol 80 mg bid for now Will increase  Metoprolol to 50 mg  bid if BP allows, she will hold amlodipine for now Occasionally will have a soft BP She will check her HR/BP 2x a day before BB dose is taken, adjust dose accordingly if BP soft  Continue warfarin for a chadsvasc score of at least 6 INR pending 2/26 with Megan  2. HTN Stable today, occasionally has noted a low BP reading for which she will hold BB   3. PPM Per device  clinic  F/u 2/26, hopefully she can be cardioverted or change in antiarrythmic once atrial thrombus  resolved  Colonoscopy in March has to be postponed until atrial clot situation is  resolved   Butch Penny C. Ruthie Berch, [facility] [address] [place], [place] [address] [phone]

## 2018-10-04 NOTE — Patient Instructions (Signed)
Stop amlodipine  Increase metoprolol to 50mg  twice a day - check your blood pressure before each dose if top number of blood pressure is less than 110 just take 37.5mg  of metoprolol

## 2018-10-05 ENCOUNTER — Telehealth: Payer: Self-pay

## 2018-10-05 DIAGNOSIS — I513 Intracardiac thrombosis, not elsewhere classified: Secondary | ICD-10-CM

## 2018-10-05 DIAGNOSIS — Z01818 Encounter for other preprocedural examination: Secondary | ICD-10-CM

## 2018-10-05 NOTE — Telephone Encounter (Signed)
Spoke with the patient, she expressed understanding about Dr. Theodosia Blender recommendations to order a Cardiac CT. She had no further questions, message sent to scheduling.

## 2018-10-05 NOTE — Telephone Encounter (Signed)
-----   Message from Sueanne Margarita, MD sent at 10/03/2018  2:17 PM EST ----- Suezanne Jacquet,   Please order a cardiac CT with contrast with the diagnosis being lpossible eft atrial thrombus for further evaluation with CT to document whether this is truly a thrombus in the left atrial appendage as there was a question on TEE.  In the meantime Megan please increase INR goal to 2.5-3.5.  Fransico Him ----- Message ----- From: Leeroy Bock, [facility] Sent: 10/03/2018  11:42 AM EST To: Sueanne Margarita, MD  Pt on therapeutic warfarin with TEE showing suspicion for left atrial appendage thrombus. I'll increase her warfarin dose to target INR 2.5-3.5.  Thanks, [employer]

## 2018-10-07 ENCOUNTER — Other Ambulatory Visit: Payer: Self-pay | Admitting: Gastroenterology

## 2018-10-07 DIAGNOSIS — R195 Other fecal abnormalities: Secondary | ICD-10-CM

## 2018-10-12 ENCOUNTER — Ambulatory Visit (HOSPITAL_COMMUNITY)
Admission: RE | Admit: 2018-10-12 | Discharge: 2018-10-12 | Disposition: A | Discharge status: Home / Self Care | Payer: Medicare Other | Source: Ambulatory Visit | Attending: Nurse Practitioner | Admitting: Nurse Practitioner

## 2018-10-12 ENCOUNTER — Encounter (HOSPITAL_COMMUNITY): Payer: Self-pay | Admitting: Nurse Practitioner

## 2018-10-12 ENCOUNTER — Other Ambulatory Visit: Payer: Medicare Other | Admitting: *Deleted

## 2018-10-12 ENCOUNTER — Ambulatory Visit: Payer: Medicare Other | Admitting: Pharmacist

## 2018-10-12 DIAGNOSIS — Z5181 Encounter for therapeutic drug level monitoring: Secondary | ICD-10-CM | POA: Diagnosis not present

## 2018-10-12 DIAGNOSIS — I4891 Unspecified atrial fibrillation: Secondary | ICD-10-CM

## 2018-10-12 LAB — BASIC METABOLIC PANEL
BUN/Creatinine Ratio: 15 (ref 12–28)
BUN: 18 mg/dL (ref 8–27)
CO2: 22 mmol/L (ref 20–29)
Calcium: 9.4 mg/dL (ref 8.7–10.3)
Chloride: 98 mmol/L (ref 96–106)
Creatinine, Ser: 1.18 mg/dL — ABNORMAL HIGH (ref 0.57–1.00)
GFR calc Af Amer: 51 mL/min/{1.73_m2} — ABNORMAL LOW (ref >59)
GFR calc non Af Amer: 44 mL/min/{1.73_m2} — ABNORMAL LOW (ref >59)
Glucose: 103 mg/dL — ABNORMAL HIGH (ref 65–99)
Potassium: 4.7 mmol/L (ref 3.5–5.2)
Sodium: 134 mmol/L (ref 134–144)

## 2018-10-12 LAB — POCT INR: INR: 3.1 — AB (ref 2.0–3.0)

## 2018-10-12 NOTE — Patient Instructions (Signed)
INR range increased to 2.5-3.5 after TEE 09/30/18 revealed LA thrombus when INR was therapeutic. Continue taking 1/2 tablet daily except 1 tablet on Mondays. Will recheck INR in 2-3 weeks.

## 2018-10-18 ENCOUNTER — Encounter (HOSPITAL_COMMUNITY): Admission: RE | Payer: Self-pay | Source: Home / Self Care

## 2018-10-18 ENCOUNTER — Ambulatory Visit (HOSPITAL_COMMUNITY): Admission: RE | Admit: 2018-10-18 | Payer: Medicare Other | Source: Home / Self Care | Admitting: Gastroenterology

## 2018-10-18 ENCOUNTER — Telehealth (HOSPITAL_COMMUNITY): Payer: Self-pay | Admitting: Emergency Medicine

## 2018-10-18 ENCOUNTER — Other Ambulatory Visit (HOSPITAL_COMMUNITY): Payer: Self-pay | Admitting: Emergency Medicine

## 2018-10-18 DIAGNOSIS — I4891 Unspecified atrial fibrillation: Secondary | ICD-10-CM

## 2018-10-18 SURGERY — COLONOSCOPY WITH PROPOFOL
Anesthesia: Monitor Anesthesia Care

## 2018-10-18 NOTE — Telephone Encounter (Signed)
Reaching out to patient to offer assistance regarding upcoming cardiac imaging study; pt verbalizes understanding of appt date/time, parking situation and where to check in, pre-test NPO status and medications ordered, and verified current allergies; name and call back number provided for further questions should they arise Katha Kuehne RN Navigator Cardiac Imaging [facility] [phone] office [phone] cell 

## 2018-10-19 ENCOUNTER — Other Ambulatory Visit (HOSPITAL_COMMUNITY): Payer: Self-pay | Admitting: Emergency Medicine

## 2018-10-19 ENCOUNTER — Telehealth (HOSPITAL_COMMUNITY): Payer: Self-pay | Admitting: Emergency Medicine

## 2018-10-19 DIAGNOSIS — N289 Disorder of kidney and ureter, unspecified: Secondary | ICD-10-CM

## 2018-10-19 NOTE — Telephone Encounter (Signed)
Called to update patient on schedule of events for tomorrows appointment. Because her Creat and GFR show some renal insufficiency, she will arrive to medical day to start her bicarb gtt for contrast induced nephropathy at 11am. Then have her CT scans at 12:30p  Pt verbalized understanding, has my phone number if questions arise.  Marchia Bond RN Navigator Cardiac Imaging [facility] [phone] Office  [phone] Cell

## 2018-10-20 ENCOUNTER — Inpatient Hospital Stay (HOSPITAL_COMMUNITY)
Admission: RE | Admit: 2018-10-20 | Discharge: 2018-10-20 | Disposition: A | Discharge status: Home / Self Care | Payer: Medicare Other | Source: Ambulatory Visit | Attending: Cardiology | Admitting: Cardiology

## 2018-10-20 ENCOUNTER — Ambulatory Visit (HOSPITAL_COMMUNITY)
Admission: RE | Admit: 2018-10-20 | Discharge: 2018-10-20 | Disposition: A | Discharge status: Home / Self Care | Payer: Medicare Other | Source: Ambulatory Visit | Attending: Cardiology | Admitting: Cardiology

## 2018-10-20 DIAGNOSIS — I517 Cardiomegaly: Secondary | ICD-10-CM | POA: Insufficient documentation

## 2018-10-20 DIAGNOSIS — K449 Diaphragmatic hernia without obstruction or gangrene: Secondary | ICD-10-CM | POA: Insufficient documentation

## 2018-10-20 DIAGNOSIS — I513 Intracardiac thrombosis, not elsewhere classified: Secondary | ICD-10-CM

## 2018-10-20 DIAGNOSIS — N289 Disorder of kidney and ureter, unspecified: Secondary | ICD-10-CM | POA: Diagnosis present

## 2018-10-20 DIAGNOSIS — I4891 Unspecified atrial fibrillation: Secondary | ICD-10-CM | POA: Diagnosis not present

## 2018-10-20 MED ORDER — METOPROLOL TARTRATE 5 MG/5ML IV SOLN
INTRAVENOUS | Status: AC
  Administered 2018-10-20: 5 mg
  Filled 2018-10-20: qty 20

## 2018-10-20 MED ORDER — SODIUM BICARBONATE BOLUS VIA INFUSION
INTRAVENOUS | Status: AC
  Administered 2018-10-20: 225 meq via INTRAVENOUS
  Filled 2018-10-20: qty 1

## 2018-10-20 MED ORDER — SODIUM BICARBONATE 8.4 % IV SOLN
INTRAVENOUS | Status: AC
  Administered 2018-10-20: 14:00:00 via INTRAVENOUS
  Filled 2018-10-20: qty 500

## 2018-10-20 MED ORDER — IOPAMIDOL (ISOVUE-370) INJECTION 76%
80.0000 mL | Freq: Once | INTRAVENOUS | Status: AC | PRN
  Administered 2018-10-20: 80 mL via INTRAVENOUS

## 2018-10-20 MED ORDER — METOPROLOL TARTRATE 5 MG/5ML IV SOLN
5.0000 mg | INTRAVENOUS | Status: DC | PRN
  Administered 2018-10-20: 5 mg via INTRAVENOUS
  Filled 2018-10-20: qty 5

## 2018-10-25 ENCOUNTER — Telehealth: Payer: Self-pay

## 2018-10-25 NOTE — Telephone Encounter (Signed)
Spoke with the patient, she has still been having chest pain and SOB. She is still in afib. She wanted to know if she would need a cath since the report indicated one.

## 2018-10-25 NOTE — Telephone Encounter (Signed)
-----   Message from Sueanne Margarita, MD sent at 10/20/2018  9:57 PM EST ----- Please find out if she has had any CP or SOB.  If asymptomatic then needs lexiscan myoview to rule out ischemia prior to eye surgery

## 2018-10-30 NOTE — Telephone Encounter (Signed)
Please get her in to see extender early this week to determine nuclear stress test vs. Cath based on sx.

## 2018-10-31 NOTE — Telephone Encounter (Signed)
Please find out how often she is having CP, what it is like, any associated symptoms and how long it lasts

## 2018-10-31 NOTE — Telephone Encounter (Addendum)
Spoke with the pt and made her an appt with Estella Husk PA 11/01/18 at 12:30pm and her INR to be checked at 1:00pm same day.   Will forward to Dr. Alpha Gula to determine if she still wants to bring the pt in to the office now that the office is making other arrangements for pt in efforts for the virus issues.. pt is reluctant to come in to the office.

## 2018-11-01 ENCOUNTER — Other Ambulatory Visit: Payer: Self-pay

## 2018-11-01 ENCOUNTER — Ambulatory Visit: Payer: Medicare Other | Admitting: Physician Assistant

## 2018-11-01 ENCOUNTER — Ambulatory Visit (INDEPENDENT_AMBULATORY_CARE_PROVIDER_SITE_OTHER): Payer: Medicare Other | Admitting: Pharmacist

## 2018-11-01 ENCOUNTER — Encounter: Payer: Self-pay | Admitting: Physician Assistant

## 2018-11-01 VITALS — BP 136/86 | HR 84 | Ht 63.0 in | Wt 159.0 lb

## 2018-11-01 DIAGNOSIS — Z5181 Encounter for therapeutic drug level monitoring: Secondary | ICD-10-CM

## 2018-11-01 DIAGNOSIS — R079 Chest pain, unspecified: Secondary | ICD-10-CM

## 2018-11-01 DIAGNOSIS — I513 Intracardiac thrombosis, not elsewhere classified: Secondary | ICD-10-CM

## 2018-11-01 DIAGNOSIS — I4891 Unspecified atrial fibrillation: Secondary | ICD-10-CM

## 2018-11-01 DIAGNOSIS — I2510 Atherosclerotic heart disease of native coronary artery without angina pectoris: Secondary | ICD-10-CM

## 2018-11-01 DIAGNOSIS — I480 Paroxysmal atrial fibrillation: Secondary | ICD-10-CM

## 2018-11-01 HISTORY — DX: Atherosclerotic heart disease of native coronary artery without angina pectoris: I25.10

## 2018-11-01 LAB — POCT INR: INR: 3.5 — AB (ref 2.0–3.0)

## 2018-11-01 NOTE — Patient Instructions (Signed)
Continue taking 1/2 tablet daily except 1 tablet on Mondays. Will recheck INR in 4 weeks.

## 2018-11-01 NOTE — Telephone Encounter (Signed)
Discussed at [place] on 11/01/18.

## 2018-11-01 NOTE — Patient Instructions (Addendum)
Medication Instructions:  Your physician recommends that you continue on your current medications as directed. Please refer to the Current Medication list given to you today.  If you need a refill on your cardiac medications before your next appointment, please call your pharmacy.   Lab work: None Ordered  If you have labs (blood work) drawn today and your tests are completely normal, you will receive your results only by: Marland Kitchen MyChart Message (if you have MyChart) OR . A paper copy in the mail If you have any lab test that is abnormal or we need to change your treatment, we will call you to review the results.  Testing/Procedures: Your physician has requested that you have a lexiscan myoview IN 4-6 WEEKS. For further information please visit [email or web address]. Please follow instruction sheet, as given.  Follow-Up: At [facility], you and your health needs are our priority.  As part of our continuing mission to provide you with exceptional heart care, we have created designated Provider Care Teams.  These Care Teams include your primary Cardiologist (physician) and Advanced Practice Providers (APPs -  Physician Assistants and Nurse Practitioners) who all work together to provide you with the care you need, when you need it. . You will need a follow up appointment in AFTER TESTS.  Please call our office 2 months in advance to schedule this appointment.  You may see Fransico Him, MD or one of the following Advanced Practice Providers on your designated Care Team:   . Lyda Jester, PA-C . Dayna Dunn, PA-C . Ermalinda Barrios, PA-C  Any Other Special Instructions Will Be Listed Below (If Applicable).

## 2018-11-01 NOTE — Progress Notes (Signed)
Cardiology Office Note    Date:  11/01/2018   ID:  Monica [name], DOB March 28, 1941, MRN [id number]  PCP:  Carol Ada, MD  Cardiologist: Fransico Him, MD EPS: Thompson Grayer, MD  Chief Complaint  Patient presents with   Follow-up    History of Present Illness:  Monica [name] is a 78 y.o. female with history of ablation x2, PPM implant 08/2016, PAF on sotalol.  She was seen in the atrial fibrillation clinic 10/04/2018 and was in atrial fibrillation since 09/24/2018.  She had not missed any sotalol doses.  The patient was needing cataract surgery as well as a colonoscopy.  It was discussed if she had recurrent atrial fibrillation she would need to be hospitalized for this for a Tikosyn load which would be difficult right now with the Covid 19.  On 09/30/2018 she had TEE guided cardioversion.  Unfortunately TEE showed a small primarily fixed left atrial thrombus on the anterior of the left atrial appendage and cardioversion was not performed.  Coumadin clinic was aware and increased her INR to 2.5-3.5 goal.  Dr. Radford Pax ordered a cardiac CT and plan was to leave her in atrial fibrillation until the clot issue resolved.  Beta-blocker was increased to slow her down.  Cardiac CT showed calcium score of 34 which is 64 percentile for age and sex.  Upper normal aortic size 3.7 cm.  Complex mixed plaque in the ostial LAD involving D1 takeoff 50 to 75% stenosis.  Study not suitable for FFR due to Ms. registration artifact.  Given location appearance of lesion would proceed with diagnostic heart cath.  Dr. Radford Pax reviewed the CT and recommended [facility] for further evaluation.  Because she is on warfarin due to blood clot she is scheduled for a CT virtual colonoscopy 11/22/2018.  Patient comes in today to discuss CT results. Complains of dyspnea on exertion with the Atrial fibrillation which is bothersome to her.  She cannot wait to get the A. fib taking care of.  She denies any chest pain,  tightness, pressure, dizziness or presyncope.  Patient states that sometimes she takes an extra half of metoprolol if her heart is over 100.  She cannot take higher doses regularly because oftentimes in the morning her blood pressure is below 401 systolic.  Past Medical History:  Diagnosis Date   Adrenal adenoma 6/11   lt on ct 6/11   GERD (gastroesophageal reflux disease)    Hypercholesteremia    Hypertension    Kidney stone 6/11   rt. on ct 6/11   Osteoporosis    Paroxysmal atrial fibrillation ([place])    a. PVI 10/13 b. PVI 12/15    Past Surgical History:  Procedure Laterality Date   ATRIAL FIBRILLATION ABLATION N/A 06/07/2012   PVI and CTI ablation by Dr Rayann Heman   ATRIAL FIBRILLATION ABLATION N/A 07/31/2014   PVI by Dr Rayann Heman; initial ablation 2013   CARDIOVERSION  05/06/2012   Procedure: CARDIOVERSION;  Surgeon: Sueanne Margarita, MD;  Location: [facility] ENDOSCOPY;  Service: Cardiovascular;  Laterality: N/A;  h/p in file drawer   CARDIOVERSION N/A 10/06/2017   Procedure: CARDIOVERSION;  Surgeon: Skeet Latch, MD;  Location: [place];  Service: Cardiovascular;  Laterality: N/A;   CARDIOVERSION N/A 03/28/2018   Procedure: CARDIOVERSION;  Surgeon: Dorothy Spark, MD;  Location: [facility] ENDOSCOPY;  Service: Cardiovascular;  Laterality: N/A;   CESAREAN SECTION  1978-1979   x-2   CHOLECYSTECTOMY  1989   ELECTROPHYSIOLOGIC STUDY N/A 09/08/2016   Procedure: Cardioversion;  Surgeon: Thompson Grayer, MD;  Location: [place] CV LAB;  Service: Cardiovascular;  Laterality: N/A;   EP IMPLANTABLE DEVICE N/A 09/08/2016   Procedure: Loop Recorder Removal;  Surgeon: Thompson Grayer, MD;  Location: [place] CV LAB;  Service: Cardiovascular;  Laterality: N/A;   EP IMPLANTABLE DEVICE N/A 09/08/2016   Procedure: Pacemaker Implant;  Surgeon: Thompson Grayer, MD;  Location: [place] CV LAB;  Service: Cardiovascular;  Laterality: N/A;   Implantable loop recorder implantation  05/30/14    Medtronic Reveal LINQ implanted by Dr Rayann Heman with RIO II protocol (ambulatory setting)   TEE WITHOUT CARDIOVERSION  06/06/2012   Procedure: TRANSESOPHAGEAL ECHOCARDIOGRAM (TEE);  Surgeon: Jettie Booze, MD;  Location: [place];  Service: Cardiovascular;  Laterality: N/A;   TEE WITHOUT CARDIOVERSION N/A 07/30/2014   Procedure: TRANSESOPHAGEAL ECHOCARDIOGRAM (TEE);  Surgeon: Larey Dresser, MD;  Location: [place];  Service: Cardiovascular;  Laterality: N/A;   TEE WITHOUT CARDIOVERSION N/A 10/06/2017   Procedure: TRANSESOPHAGEAL ECHOCARDIOGRAM (TEE);  Surgeon: Skeet Latch, MD;  Location: [place];  Service: Cardiovascular;  Laterality: N/A;   TEE WITHOUT CARDIOVERSION N/A 03/28/2018   Procedure: TRANSESOPHAGEAL ECHOCARDIOGRAM (TEE);  Surgeon: Dorothy Spark, MD;  Location: [facility] ENDOSCOPY;  Service: Cardiovascular;  Laterality: N/A;   TEE WITHOUT CARDIOVERSION N/A 09/30/2018   Procedure: TRANSESOPHAGEAL ECHOCARDIOGRAM (TEE);  Surgeon: Elouise Munroe, MD;  Location: [facility] ENDOSCOPY;  Service: Cardiovascular;  Laterality: N/A;    Current Medications: Current Meds  Medication Sig   acetaminophen (TYLENOL) 500 MG tablet Take 500-1,000 mg by mouth every 6 (six) hours as needed (pain/headaches.).    calcium carbonate (OS-CAL) 600 MG TABS tablet Take 600 mg by mouth 2 (two) times daily with a meal.    cholecalciferol (VITAMIN D) 1000 UNITS tablet Take 1,000 Units by mouth every evening.    losartan (COZAAR) 100 MG tablet Take 0.5 tablets (50 mg total) by mouth daily.   metoprolol tartrate (LOPRESSOR) 25 MG tablet Take 2 tablets (50 mg total) by mouth 2 (two) times daily.   pantoprazole (PROTONIX) 40 MG tablet Take 40 mg by mouth daily before breakfast.    pravastatin (PRAVACHOL) 20 MG tablet Take 20 mg by mouth every evening.    sotalol (BETAPACE) 80 MG tablet TAKE 1 TABLET (80 MG TOTAL) BY MOUTH 2 (TWO) TIMES DAILY.   traZODone (DESYREL) 50 MG tablet Take 50 mg  by mouth at bedtime.    warfarin (COUMADIN) 5 MG tablet Take 1/2 tablet daily or as directed by the Coumadin clinic (Patient taking differently: Take 2.5 mg by mouth at bedtime. )     Allergies:   Morphine and related and Codeine   Social History   Socioeconomic History   Marital status: Widowed    Spouse name: Not on file   Number of children: Not on file   Years of education: Not on file   Highest education level: Not on file  Occupational History   Not on file  Social Needs   Financial resource strain: Not on file   Food insecurity:    Worry: Not on file    Inability: Not on file   Transportation needs:    Medical: Not on file    Non-medical: Not on file  Tobacco Use   Smoking status: Never Smoker   Smokeless tobacco: Never Used  Substance and Sexual Activity   Alcohol use: No   Drug use: No   Sexual activity: Not on file  Lifestyle   Physical activity:  Days per week: Not on file    Minutes per session: Not on file   Stress: Not on file  Relationships   Social connections:    Talks on phone: Not on file    Gets together: Not on file    Attends religious service: Not on file    Active member of club or organization: Not on file    Attends meetings of clubs or organizations: Not on file    Relationship status: Not on file  Other Topics Concern   Not on file  Social History Narrative   Occupation:Retired, Herbalist., 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   [employer]   Marital Status:single,widowed,2006.     Family History:  The patient's family history includes Atrial fibrillation in her sister; CAD in her father; Heart attack in her brother and father; Hip fracture in her mother; Lung cancer in her brother; Other in her brother; Ovarian cancer in her sister; Thyroid cancer in her sister.   ROS:   Please see the history of present illness.    Review of Systems  Constitution: Negative.  HENT: Negative.   Eyes: Negative.     Cardiovascular: Positive for dyspnea on exertion.  Respiratory: Negative.   Hematologic/Lymphatic: Negative.   Musculoskeletal: Negative.  Negative for joint pain.  Gastrointestinal: Negative.   Genitourinary: Negative.   Neurological: Negative.    All other systems reviewed and are negative.   PHYSICAL EXAM:   VS:  BP 136/86    Pulse 84    Ht 5\' 3"  (1.6 m)    Wt 159 lb (72.1 kg)    SpO2 97%    BMI 28.17 kg/m   Physical Exam  GEN: Well nourished, well developed, in no acute distress  Neck: no JVD, carotid bruits, or masses Cardiac: Irregular irregular with 1/6 systolic murmur at the left sternal border Respiratory:  clear to auscultation bilaterally, normal work of breathing GI: soft, nontender, nondistended, + BS Ext: without cyanosis, clubbing, or edema, Good distal pulses bilaterally Neuro:  Alert and Oriented x 3 Psych: euthymic mood, full affect  Wt Readings from Last 3 Encounters:  11/01/18 159 lb (72.1 kg)  10/12/18 160 lb (72.6 kg)  10/04/18 157 lb (71.2 kg)      Studies/Labs Reviewed:   EKG:  EKG is not ordered today.   Recent Labs: 09/28/2018: Hemoglobin 13.3; Magnesium 2.3; Platelets 289 10/12/2018: BUN 18; Creatinine, Ser 1.18; Potassium 4.7; Sodium 134   Lipid Panel No results found for: CHOL, TRIG, HDL, CHOLHDL, VLDL, LDLCALC, LDLDIRECT  Additional studies/ records that were reviewed today include:  Cardiac CT 10/20/2018 IMPRESSION: 1.  Calcium score 34 which is 59 th percentile for age and sex   2.   Upper normal aortic root size 3.7 cm   3. Complex mixed plaque in ostial LAD involving D1 take off 50-75% stenosis. Study not suitable for FFR CT due to misregistration artifact   Given location and appearance of lesion would proceed with diagnostic heart cath   Jenkins Rouge     Electronically Signed   By: Jenkins Rouge M.D.   On: 10/20/2018 15:34      ASSESSMENT:    1. Paroxysmal atrial fibrillation (HCC)   2. 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 Coronary artery calcification seen on CT scan   4. Chest pain, unspecified type      PLAN:  In order of problems listed above:  PAF with history of ablation x2, permanent pacemaker  2018, recently found to be in atrial fibrillation since 09/24/2018 and underwent TEE guided cardioversion 09/30/2018.  Unfortunately she was found to have a small primary fixed left atrial thrombus on the anterior of the left atrial appendage and cardioversion was canceled.  She was therapeutic on Coumadin and therefore her INR was increased to 2.5-3.5 goal.  Dr. Radford Pax ordered a cardiac CT that showed a 50 to 75% stenosis in the ostial LAD involving D1 takeoff.  Study was not suitable for FFR and the reader recommended diagnostic heart cath.  Dr. Radford Pax reviewed the CT scan and recommended Lexiscan.  Thrombus of the left atrium on Coumadin for INR check today with goal of 2.5-3.5.  Coronary artery calcification on CT as discussed above.  Patient is asymptomatic.  She has chronic dyspnea on exertion whenever she is in atrial fibrillation.  Denies chest pain.  She is on Coumadin at a higher dose because of left atrial clot so a cardiac catheterization is not a good option for her at this time.  Can risk stratify with a Lexiscan Myoview in the future with the coronavirus we will hold off for several weeks given she is asymptomatic and has a clot.  Follow-up with Dr. Radford Pax after.    Medication Adjustments/Labs and Tests Ordered: Current medicines are reviewed at length with the patient today.  Concerns regarding medicines are outlined above.  Medication changes, Labs and Tests ordered today are listed in the Patient Instructions below. Patient Instructions  Medication Instructions:  Your physician recommends that you continue on your current medications as directed. Please refer to the Current Medication list given to you today.  If you need a refill on your cardiac  medications before your next appointment, please call your pharmacy.   Lab work: None Ordered  If you have labs (blood work) drawn today and your tests are completely normal, you will receive your results only by:  [facility] (if you have MyChart) OR  A paper copy in the mail If you have any lab test that is abnormal or we need to change your treatment, we will call you to review the results.  Testing/Procedures: Your physician has requested that you have a lexiscan myoview IN 4-6 WEEKS. For further information please visit [email or web address]. Please follow instruction sheet, as given.  Follow-Up: At [facility], you and your health needs are our priority.  As part of our continuing mission to provide you with exceptional heart care, we have created designated Provider Care Teams.  These Care Teams include your primary Cardiologist (physician) and Advanced Practice Providers (APPs -  Physician Assistants and Nurse Practitioners) who all work together to provide you with the care you need, when you need it.  You will need a follow up appointment in AFTER TESTS.  Please call our office 2 months in advance to schedule this appointment.  You may see Fransico Him, MD or one of the following Advanced Practice Providers on your designated Care Team:    Lyda Jester, PA-C  Dayna Dunn, PA-C  Ermalinda Barrios, PA-C  Any Other Special Instructions Will Be Listed Below (If Applicable).       Signed, Ermalinda Barrios, PA-C  11/01/2018 2:28 PM    [facility] [address], [place], [place]  [address] Phone: [phone]; Fax: [phone]

## 2018-11-22 ENCOUNTER — Inpatient Hospital Stay: Admission: RE | Admit: 2018-11-22 | Payer: Medicare Other | Source: Ambulatory Visit

## 2018-12-05 ENCOUNTER — Ambulatory Visit (INDEPENDENT_AMBULATORY_CARE_PROVIDER_SITE_OTHER): Payer: Medicare Other | Admitting: *Deleted

## 2018-12-05 ENCOUNTER — Other Ambulatory Visit: Payer: Self-pay

## 2018-12-05 DIAGNOSIS — I4891 Unspecified atrial fibrillation: Secondary | ICD-10-CM

## 2018-12-05 LAB — CUP PACEART REMOTE DEVICE CHECK
Battery Remaining Longevity: 78 mo
Battery Voltage: 3 V
Brady Statistic AP VP Percent: 0.23 %
Brady Statistic AP VS Percent: 20.94 %
Brady Statistic AS VP Percent: 21.22 %
Brady Statistic AS VS Percent: 57.61 %
Brady Statistic RA Percent Paced: 13.42 %
Brady Statistic RV Percent Paced: 17.12 %
Date Time Interrogation Session: 20200420141004
Implantable Lead Implant Date: 20180123
Implantable Lead Implant Date: 20180123
Implantable Lead Location: 753859
Implantable Lead Location: 753860
Implantable Lead Model: 5076
Implantable Lead Model: 5076
Implantable Pulse Generator Implant Date: 20180123
Lead Channel Impedance Value: 285 Ohm
Lead Channel Impedance Value: 418 Ohm
Lead Channel Impedance Value: 437 Ohm
Lead Channel Impedance Value: 570 Ohm
Lead Channel Pacing Threshold Amplitude: 0.625 V
Lead Channel Pacing Threshold Amplitude: 0.625 V
Lead Channel Pacing Threshold Pulse Width: 0.4 ms
Lead Channel Pacing Threshold Pulse Width: 0.4 ms
Lead Channel Sensing Intrinsic Amplitude: 0.875 mV
Lead Channel Sensing Intrinsic Amplitude: 0.875 mV
Lead Channel Sensing Intrinsic Amplitude: 5.5 mV
Lead Channel Sensing Intrinsic Amplitude: 5.5 mV
Lead Channel Setting Pacing Amplitude: 2 V
Lead Channel Setting Pacing Amplitude: 2.5 V
Lead Channel Setting Pacing Pulse Width: 0.4 ms
Lead Channel Setting Sensing Sensitivity: 2 mV

## 2018-12-07 ENCOUNTER — Telehealth: Payer: Self-pay | Admitting: Cardiovascular Disease

## 2018-12-07 NOTE — Telephone Encounter (Signed)
   Primary Cardiologist: Fransico Him, MD   Pt contacted.  History and symptoms reviewed.   Still having exertional CP and dyspnea Also with upper arm motion Has had for several month   No fever,  Has watery eyes ( likely allergies )  Some diarrhea   Coronary CT showed a moderate complex proximal LAD stenosis  She has not had any COVID 19 symptoms She really is limited by her exertional Chest tightness and dyspnea I think she should keep her appt for Wednesday, April 29 for her Lexiscan myoview    Pt will f/u with [facility] provider as scheduled.  Pt. advised that we are restricting visitors at this time and request that only patients present for check-in prior to their appointment.  All other visitors should remain in their car.  If necessary, only one visitor may come with the patient, into the building. For everyone's safety, all patients and visitor entering our practice area should expect to be screened again prior to entering our waiting area.  Mertie Moores, MD  12/07/2018 12:28 PM      .

## 2018-12-13 ENCOUNTER — Telehealth: Payer: Self-pay

## 2018-12-13 NOTE — Telephone Encounter (Signed)
Unable to lmom no voicemail set up

## 2018-12-14 ENCOUNTER — Other Ambulatory Visit: Payer: Self-pay

## 2018-12-14 ENCOUNTER — Ambulatory Visit (INDEPENDENT_AMBULATORY_CARE_PROVIDER_SITE_OTHER): Payer: Medicare Other | Admitting: Pharmacist

## 2018-12-14 ENCOUNTER — Ambulatory Visit (HOSPITAL_COMMUNITY): Payer: Medicare Other | Attending: Internal Medicine

## 2018-12-14 ENCOUNTER — Encounter: Payer: Self-pay | Admitting: Cardiology

## 2018-12-14 DIAGNOSIS — Z5181 Encounter for therapeutic drug level monitoring: Secondary | ICD-10-CM

## 2018-12-14 DIAGNOSIS — I4891 Unspecified atrial fibrillation: Secondary | ICD-10-CM

## 2018-12-14 DIAGNOSIS — I2510 Atherosclerotic heart disease of native coronary artery without angina pectoris: Secondary | ICD-10-CM | POA: Diagnosis not present

## 2018-12-14 DIAGNOSIS — R079 Chest pain, unspecified: Secondary | ICD-10-CM | POA: Insufficient documentation

## 2018-12-14 LAB — MYOCARDIAL PERFUSION IMAGING
LV dias vol: 46 mL (ref 46–106)
LV sys vol: 18 mL
Peak HR: 106 {beats}/min
Rest HR: 85 {beats}/min
SDS: 1
SRS: 0
SSS: 1
TID: 0.96

## 2018-12-14 LAB — POCT INR: INR: 3.3 — AB (ref 2.0–3.0)

## 2018-12-14 MED ORDER — REGADENOSON 0.4 MG/5ML IV SOLN
0.4000 mg | Freq: Once | INTRAVENOUS | Status: AC
  Administered 2018-12-14: 0.4 mg via INTRAVENOUS

## 2018-12-14 MED ORDER — TECHNETIUM TC 99M TETROFOSMIN IV KIT
8.1000 | PACK | Freq: Once | INTRAVENOUS | Status: AC | PRN
  Administered 2018-12-14: 8.1 via INTRAVENOUS
  Filled 2018-12-14: qty 9

## 2018-12-14 MED ORDER — TECHNETIUM TC 99M TETROFOSMIN IV KIT
25.4000 | PACK | Freq: Once | INTRAVENOUS | Status: AC | PRN
  Administered 2018-12-14: 25.4 via INTRAVENOUS
  Filled 2018-12-14: qty 26

## 2018-12-14 NOTE — Progress Notes (Signed)
Remote pacemaker transmission.   

## 2018-12-14 NOTE — Patient Instructions (Signed)
Continue taking 1/2 tablet daily except 1 tablet on Mondays. Will recheck INR in 6 weeks.

## 2018-12-29 ENCOUNTER — Telehealth: Payer: Self-pay | Admitting: Cardiology

## 2018-12-29 NOTE — Telephone Encounter (Signed)
Virtual Visit Pre-Appointment Phone Call  "(Name), I am calling you today to discuss your upcoming appointment. We are currently trying to limit exposure to the virus that causes COVID-19 by seeing patients at home rather than in the office."  1. "What is the BEST phone number to call the day of the visit?" - include this in appointment notes  2. Do you have or have access to (through a family member/friend) a smartphone with video capability that we can use for your visit?" a. If yes - list this number in appt notes as cell (if different from BEST phone #) and list the appointment type as a VIDEO visit in appointment notes b. If no - list the appointment type as a PHONE visit in appointment notes  3. Confirm consent - "In the setting of the current Covid19 crisis, you are scheduled for a (phone or video) visit with your provider on (date) at (time).  Just as we do with many in-office visits, in order for you to participate in this visit, we must obtain consent.  If you'd like, I can send this to your mychart (if signed up) or email for you to review.  Otherwise, I can obtain your verbal consent now.  All virtual visits are billed to your insurance company just like a normal visit would be.  By agreeing to a virtual visit, we'd like you to understand that the technology does not allow for your provider to perform an examination, and thus may limit your provider's ability to fully assess your condition. If your provider identifies any concerns that need to be evaluated in person, we will make arrangements to do so.  Finally, though the technology is pretty good, we cannot assure that it will always work on either your or our end, and in the setting of a video visit, we may have to convert it to a phone-only visit.  In either situation, we cannot ensure that we have a secure connection.  Are you willing to proceed?"  YES  4. Advise patient to be prepared - "Two hours prior to your appointment, go  ahead and check your blood pressure, pulse, oxygen saturation, and your weight (if you have the equipment to check those) and write them all down. When your visit starts, your provider will ask you for this information. If you have an Apple Watch or Kardia device, please plan to have heart rate information ready on the day of your appointment. Please have a pen and paper handy nearby the day of the visit as well."  5. Give patient instructions for MyChart download to smartphone OR [email or web address] as below if video visit (depending on what platform provider is using)  6. Inform patient they will receive a phone call 15 minutes prior to their appointment time (may be from unknown caller ID) so they should be prepared to answer    [place] has been deemed a candidate for a follow-up tele-health visit to limit community exposure during the Covid-19 pandemic. I spoke with the patient via phone to ensure availability of phone/video source, confirm preferred email & phone number, and discuss instructions and expectations.  I reminded AANYA HAYNES to be prepared with any vital sign and/or heart rhythm information that could potentially be obtained via home monitoring, at the time of her visit. I reminded Monica [name] to expect a phone call prior to her visit.  Minus Liberty 12/29/2018 4:41 PM   INSTRUCTIONS FOR  DOWNLOADING THE MYCHART APP TO SMARTPHONE  - The patient must first make sure to have activated MyChart and know their login information - If Apple, go to [employer] and type in MyChart in the search bar and download the app. If Android, ask patient to go to [employer] and type in [place] in the search bar and download the app. The app is free but as with any other app downloads, their phone may require them to verify saved payment information or Apple/Android password.  - The patient will need to then log into the app with their MyChart username and  password, and select [facility] as their healthcare provider to link the account. When it is time for your visit, go to the MyChart app, find appointments, and click Begin Video Visit. Be sure to Select Allow for your device to access the Microphone and Camera for your visit. You will then be connected, and your provider will be with you shortly.  **If they have any issues connecting, or need assistance please contact MyChart service desk [phone])**  **If using a computer, in order to ensure the best quality for their visit they will need to use either of the following Internet Browsers: [employer], or Google Chrome**  IF USING DOXIMITY or DOXY.ME - The patient will receive a link just prior to their visit by text.     FULL LENGTH CONSENT FOR TELE-HEALTH VISIT   I hereby voluntarily request, consent and authorize [facility] and its employed or contracted physicians, physician assistants, nurse practitioners or other licensed health care professionals (the Practitioner), to provide me with telemedicine health care services (the Services") as deemed necessary by the treating Practitioner. I acknowledge and consent to receive the Services by the Practitioner via telemedicine. I understand that the telemedicine visit will involve communicating with the Practitioner through live audiovisual communication technology and the disclosure of certain medical information by electronic transmission. I acknowledge that I have been given the opportunity to request an in-person assessment or other available alternative prior to the telemedicine visit and am voluntarily participating in the telemedicine visit.  I understand that I have the right to withhold or withdraw my consent to the use of telemedicine in the course of my care at any time, without affecting my right to future care or treatment, and that the Practitioner or I may terminate the telemedicine visit at any time. I  understand that I have the right to inspect all information obtained and/or recorded in the course of the telemedicine visit and may receive copies of available information for a reasonable fee.  I understand that some of the potential risks of receiving the Services via telemedicine include:   Delay or interruption in medical evaluation due to technological equipment failure or disruption;  Information transmitted may not be sufficient (e.g. poor resolution of images) to allow for appropriate medical decision making by the Practitioner; and/or   In rare instances, security protocols could fail, causing a breach of personal health information.  Furthermore, I acknowledge that it is my responsibility to provide information about my medical history, conditions and care that is complete and accurate to the best of my ability. I acknowledge that Practitioner's advice, recommendations, and/or decision may be based on factors not within their control, such as incomplete or inaccurate data provided by me or distortions of diagnostic images or specimens that may result from electronic transmissions. I understand that the practice of medicine is not an exact science and that  Practitioner makes no warranties or guarantees regarding treatment outcomes. I acknowledge that I will receive a copy of this consent concurrently upon execution via email to the email address I last provided but may also request a printed copy by calling the office of [facility].    I understand that my insurance will be billed for this visit.   I have read or had this consent read to me.  I understand the contents of this consent, which adequately explains the benefits and risks of the Services being provided via telemedicine.   I have been provided ample opportunity to ask questions regarding this consent and the Services and have had my questions answered to my satisfaction.  I give my informed consent for the services to be  provided through the use of telemedicine in my medical care  By participating in this telemedicine visit I agree to the above.

## 2019-01-01 NOTE — Progress Notes (Signed)
Virtual Visit via Video Note   This visit type was conducted due to national recommendations for restrictions regarding the COVID-19 Pandemic (e.g. social distancing) in an effort to limit this patient's exposure and mitigate transmission in our community.  Due to her co-morbid illnesses, this patient is at least at moderate risk for complications without adequate follow up.  This format is felt to be most appropriate for this patient at this time.  All issues noted in this document were discussed and addressed.  A limited physical exam was performed with this format.  Please refer to the patient's chart for her consent to telehealth for [facility].   Evaluation Performed:  Follow-up visit  This visit type was conducted due to national recommendations for restrictions regarding the COVID-19 Pandemic (e.g. social distancing).  This format is felt to be most appropriate for this patient at this time.  All issues noted in this document were discussed and addressed.  No physical exam was performed (except for noted visual exam findings with Video Visits).  Please refer to the patient's chart (MyChart message for video visits and phone note for telephone visits) for the patient's consent to telehealth for [facility].  Date:  01/02/2019   ID:  Monica [name], DOB 1940/12/08, MRN [id number]  Patient Location:  Home  Provider location:   [place]  PCP:  Carol Ada, MD  Cardiologist:  Fransico Him, MD  Electrophysiologist:  Thompson Grayer, MD   Chief Complaint: Atrial fibrillation, Coronary artery calcifications  History of Present Illness:    Monica [name] is a 78 y.o. female who presents via audio/video conferencing for a telehealth visit today.      The patient does not have symptoms concerning for COVID-19 infection (fever, chills, cough, or new shortness of breath).    Prior CV studies:   The following studies were reviewed today:  Lexiscan myoview 11/2018, TEE   Past Medical History:  Diagnosis Date  . Adrenal adenoma 6/11   lt on ct 6/11  . GERD (gastroesophageal reflux disease)   . Hypercholesteremia   . Hypertension   . Kidney stone 6/11   rt. on ct 6/11  . Osteoporosis   . Paroxysmal atrial fibrillation ([place])    a. PVI 10/13 b. PVI 12/15   Past Surgical History:  Procedure Laterality Date  . ATRIAL FIBRILLATION ABLATION N/A 06/07/2012   PVI and CTI ablation by Dr Rayann Heman  . ATRIAL FIBRILLATION ABLATION N/A 07/31/2014   PVI by Dr Rayann Heman; initial ablation 2013  . CARDIOVERSION  05/06/2012   Procedure: CARDIOVERSION;  Surgeon: Sueanne Margarita, MD;  Location: [facility] ENDOSCOPY;  Service: Cardiovascular;  Laterality: N/A;  h/p in file drawer  . CARDIOVERSION N/A 10/06/2017   Procedure: CARDIOVERSION;  Surgeon: Skeet Latch, MD;  Location: [facility] ENDOSCOPY;  Service: Cardiovascular;  Laterality: N/A;  . CARDIOVERSION N/A 03/28/2018   Procedure: CARDIOVERSION;  Surgeon: Dorothy Spark, MD;  Location: [facility] ENDOSCOPY;  Service: Cardiovascular;  Laterality: N/A;  . CESAREAN SECTION  [phone]   x-2  . CHOLECYSTECTOMY  1989  . ELECTROPHYSIOLOGIC STUDY N/A 09/08/2016   Procedure: Cardioversion;  Surgeon: Thompson Grayer, MD;  Location: [place] CV LAB;  Service: Cardiovascular;  Laterality: N/A;  . EP IMPLANTABLE DEVICE N/A 09/08/2016   Procedure: Loop Recorder Removal;  Surgeon: Thompson Grayer, MD;  Location: [place] CV LAB;  Service: Cardiovascular;  Laterality: N/A;  . EP IMPLANTABLE DEVICE N/A 09/08/2016   Procedure: Pacemaker Implant;  Surgeon: Thompson Grayer, MD;  Location:  [place] INVASIVE CV LAB;  Service: Cardiovascular;  Laterality: N/A;  . Implantable loop recorder implantation  05/30/14   Medtronic Reveal LINQ implanted by Dr Rayann Heman with RIO II protocol (ambulatory setting)  . TEE WITHOUT CARDIOVERSION  06/06/2012   Procedure: TRANSESOPHAGEAL ECHOCARDIOGRAM (TEE);  Surgeon: Jettie Booze, MD;  Location: [place];  Service:  Cardiovascular;  Laterality: N/A;  . TEE WITHOUT CARDIOVERSION N/A 07/30/2014   Procedure: TRANSESOPHAGEAL ECHOCARDIOGRAM (TEE);  Surgeon: Larey Dresser, MD;  Location: [place];  Service: Cardiovascular;  Laterality: N/A;  . TEE WITHOUT CARDIOVERSION N/A 10/06/2017   Procedure: TRANSESOPHAGEAL ECHOCARDIOGRAM (TEE);  Surgeon: Skeet Latch, MD;  Location: [place];  Service: Cardiovascular;  Laterality: N/A;  . TEE WITHOUT CARDIOVERSION N/A 03/28/2018   Procedure: TRANSESOPHAGEAL ECHOCARDIOGRAM (TEE);  Surgeon: Dorothy Spark, MD;  Location: [facility] ENDOSCOPY;  Service: Cardiovascular;  Laterality: N/A;  . TEE WITHOUT CARDIOVERSION N/A 09/30/2018   Procedure: TRANSESOPHAGEAL ECHOCARDIOGRAM (TEE);  Surgeon: Elouise Munroe, MD;  Location: [facility] ENDOSCOPY;  Service: Cardiovascular;  Laterality: N/A;     Current Meds  Medication Sig  . acetaminophen (TYLENOL) 500 MG tablet Take 500-1,000 mg by mouth every 6 (six) hours as needed (pain/headaches.).   Marland Kitchen calcium carbonate (OS-CAL) 600 MG TABS tablet Take 600 mg by mouth 2 (two) times daily with a meal.   . cholecalciferol (VITAMIN D) 1000 UNITS tablet Take 1,000 Units by mouth every evening.   Marland Kitchen losartan (COZAAR) 100 MG tablet Take 0.5 tablets (50 mg total) by mouth daily.  . metoprolol tartrate (LOPRESSOR) 25 MG tablet Take 2 tablets (50 mg total) by mouth 2 (two) times daily.  . pantoprazole (PROTONIX) 40 MG tablet Take 40 mg by mouth daily before breakfast.   . pravastatin (PRAVACHOL) 20 MG tablet Take 20 mg by mouth every evening.   . sotalol (BETAPACE) 80 MG tablet TAKE 1 TABLET (80 MG TOTAL) BY MOUTH 2 (TWO) TIMES DAILY.  . traZODone (DESYREL) 50 MG tablet Take 50 mg by mouth at bedtime.   Marland Kitchen warfarin (COUMADIN) 5 MG tablet Take 1/2 tablet daily or as directed by the Coumadin clinic (Patient taking differently: Take 2.5 mg by mouth at bedtime. )     Allergies:   Morphine and related and Codeine   Social History   Tobacco Use  .  Smoking status: Never Smoker  . Smokeless tobacco: Never Used  Substance Use Topics  . Alcohol use: No  . Drug use: No     Family Hx: The patient's family history includes Atrial fibrillation in her sister; CAD in her father; Heart attack in her brother and father; Hip fracture in her mother; Lung cancer in her brother; Other in her brother; Ovarian cancer in her sister; Thyroid cancer in her sister.  ROS:   Please see the history of present illness.     All other systems reviewed and are negative.   Labs/Other Tests and Data Reviewed:    Recent Labs: 09/28/2018: Hemoglobin 13.3; Magnesium 2.3; Platelets 289 10/12/2018: BUN 18; Creatinine, Ser 1.18; Potassium 4.7; Sodium 134   Recent Lipid Panel No results found for: CHOL, TRIG, HDL, CHOLHDL, LDLCALC, LDLDIRECT  Wt Readings from Last 3 Encounters:  01/02/19 158 lb (71.7 kg)  12/14/18 159 lb (72.1 kg)  11/01/18 159 lb (72.1 kg)     Objective:    Vital Signs:  BP (!) 153/109   Pulse 97   Ht 5\' 3"  (1.6 m)   Wt 158 lb (71.7 kg)   BMI 27.99  kg/m    CONSTITUTIONAL:  Well nourished, well developed female in no acute distress.  EYES: anicteric MOUTH: oral mucosa is pink RESPIRATORY: Normal respiratory effort, symmetric expansion CARDIOVASCULAR: No peripheral edema SKIN: No rash, lesions or ulcers MUSCULOSKELETAL: no digital cyanosis NEURO: Cranial Nerves II-XII grossly intact, moves all extremities PSYCH: Intact judgement and insight.  A&O x 3, Mood/affect appropriate   ASSESSMENT & PLAN:    1.  Persistent atrial fibrillation - she is s/p remote afib ablation x 2 in the past and recently went back into atrial fibrillation.  She was set up for TEE/DCCV in 09/2018 but unfortunately was dx with LAA thrombus (INR had been in the 2 range) at time of study and DCCV cancelled.  She has been on a higher dose of warfarin for 2 months with INRs > 3.  She has another INR pending next week.  Will set up for TEE/DCCV in June once COVID  19 crisis has improved. She is currently asymptomatic with no bleeding problems.  She will continue on Lopressor 50mg  BID, Sotolol 80mg  BID and warfarin.  She has been seen in afib clinic recently and Tikosyn was recommended but due to [place] 19 this was placed on hold.    2.  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  She has been on warfarin with an INR of > 3 for 2 months and has another INR pending in the near future. Will set up for TEE/DCCV again in late June to make sure that thrombus has resolved.  3.  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  Unable to do FFR and nuclear stress test showed no ischemia.  She has not had any anginal sx.  She will continue on BB and statin.  No ASA due to warfarin requiring higher INR.  4.  Hypertension - Her Bp is elevated today and has been when she checks it.  She will continue on Losartan 100mg  daily.  I will increase lopressor to 75mg  BID.  I have asked her to check her BP twice daily for a week and call with the results.  Her creatinine ws 1.070 on 10/24/2018.    5.  Hyperlipidemia - her LDL goal is < 70  Her last LDL was 86 last fall.  She will continue on pravastatin for now and I will repeat an FLP and ALT next month once COVID 19 crisis has improved.    COVID-19 Education: The signs and symptoms of COVID-19 were discussed with the patient and how to seek care for testing (follow up with PCP or arrange E-visit).  The importance of social distancing was discussed today.  Patient Risk:   After full review of this patient's clinical status, I feel that they are at least moderate risk at this time.  Time:   Today, I have spent 15 minutes directly with the patient on video discussing medical problems including afib, CAD, HTN, lipids.  We also reviewed the symptoms of COVID 19 and the ways to protect against contracting the virus with telehealth technology.  I  spent an additional 5 minutes reviewing patient's chart including labs, stress test and TEE.  Medication Adjustments/Labs and Tests Ordered: Current medicines are reviewed at length with the patient today.  Concerns regarding medicines are outlined above.  Tests Ordered: No orders of the defined types were placed in this encounter.  Medication Changes: No orders  of the defined types were placed in this encounter.   Disposition:  Follow up in 3 month(s) with me and 1-2 weeks after DCCV with afib clinic  Signed, Fransico Him, MD  01/02/2019 11:52 AM    [place]

## 2019-01-02 ENCOUNTER — Encounter: Payer: Self-pay | Admitting: Cardiology

## 2019-01-02 ENCOUNTER — Other Ambulatory Visit: Payer: Self-pay

## 2019-01-02 ENCOUNTER — Telehealth (INDEPENDENT_AMBULATORY_CARE_PROVIDER_SITE_OTHER): Payer: Medicare Other | Admitting: Cardiology

## 2019-01-02 DIAGNOSIS — I10 Essential (primary) hypertension: Secondary | ICD-10-CM

## 2019-01-02 DIAGNOSIS — I4819 Other persistent atrial fibrillation: Secondary | ICD-10-CM | POA: Diagnosis not present

## 2019-01-02 DIAGNOSIS — E7800 Pure hypercholesterolemia, unspecified: Secondary | ICD-10-CM

## 2019-01-02 DIAGNOSIS — I513 Intracardiac thrombosis, not elsewhere classified: Secondary | ICD-10-CM | POA: Diagnosis not present

## 2019-01-02 DIAGNOSIS — I2510 Atherosclerotic heart disease of native coronary artery without angina pectoris: Secondary | ICD-10-CM | POA: Diagnosis not present

## 2019-01-02 NOTE — Patient Instructions (Addendum)
Medication Instructions:  Your physician recommends that you continue on your current medications as directed. Please refer to the Current Medication list given to you today.  If you need a refill on your cardiac medications before your next appointment, please call your pharmacy.   Lab work: BMET, CBC w/diff on 02/06/19, same day as coumadin check.   If you have labs (blood work) drawn today and your tests are completely normal, you will receive your results only by: Marland Kitchen MyChart Message (if you have MyChart) OR . A paper copy in the mail If you have any lab test that is abnormal or we need to change your treatment, we will call you to review the results.  Testing/Procedures: Your physician has requested that you have a TEE/Cardioversion. During a TEE, sound waves are used to create images of your heart. It provides your doctor with information about the size and shape of your heart and how well your heart's chambers and valves are working. In this test, a transducer is attached to the end of a flexible tube that is guided down you throat and into your esophagus (the tube leading from your mouth to your stomach) to get a more detailed image of your heart. Once the TEE has determined that a blood clot is not present, the cardioversion begins. Electrical Cardioversion uses a jolt of electricity to your heart either through paddles or wired patches attached to your chest. This is a controlled, usually prescheduled, procedure. This procedure is done at the hospital and you are not awake during the procedure. You usually go home the day of the procedure. Please see the instruction sheet given to you today for more information.  Follow-Up:Your physician recommends that you schedule a follow-up appointment in: 3 months with Dr. Radford Pax  Your physician recommends that you schedule a follow-up appointment in: 7-10 days with afib clinic.   Any Other Special Instructions Will Be Listed Below (If  Applicable).   You are scheduled for a TEE Cardioversion on 02/09/19 with Dr. Debara Pickett.  Please [facility] at [facility] at [facility]: [address] [place], [place] [address] at 12 pm.   DIET: Nothing to eat or drink after midnight except a sip of water with medications (see medication instructions below)  Medication Instructions: Continue your anticoagulant: Coumadin  You will need to continue your anticoagulant after your procedure until you are told by your  Provider that it is safe to stop  You must have a responsible person to drive you home and stay in the waiting area during your procedure. Failure to do so could result in cancellation.  Bring your insurance cards.  *Special Note: Every effort is made to have your procedure done on time. Occasionally there are emergencies that occur at the hospital that may cause delays. Please be patient if a delay does occur  Your Pre-procedure COVID-19 Testing will be done on 02/06/19 at [place] at [address] [place],  [address] After your swab you will be given a mask to wear and instructed to go home and quarantine/no visitors until after your procedure. If you test positive you will be notified and your procedure will be cancelled.

## 2019-01-02 NOTE — Addendum Note (Signed)
Addended by: Sarina Ill on: 01/02/2019 03:35 PM   Modules accepted: Orders

## 2019-01-03 ENCOUNTER — Telehealth: Payer: Self-pay | Admitting: Pharmacist

## 2019-01-03 NOTE — Telephone Encounter (Signed)
Patient scheduled for weekly INR checks prior to cardioversion on 6/25

## 2019-01-06 ENCOUNTER — Other Ambulatory Visit: Payer: Medicare Other

## 2019-01-16 ENCOUNTER — Telehealth: Payer: Self-pay

## 2019-01-16 NOTE — Telephone Encounter (Signed)

## 2019-01-18 ENCOUNTER — Telehealth: Payer: Self-pay

## 2019-01-18 ENCOUNTER — Ambulatory Visit (INDEPENDENT_AMBULATORY_CARE_PROVIDER_SITE_OTHER): Payer: Medicare Other | Admitting: *Deleted

## 2019-01-18 ENCOUNTER — Other Ambulatory Visit: Payer: Self-pay

## 2019-01-18 DIAGNOSIS — I4891 Unspecified atrial fibrillation: Secondary | ICD-10-CM

## 2019-01-18 DIAGNOSIS — Z5181 Encounter for therapeutic drug level monitoring: Secondary | ICD-10-CM | POA: Diagnosis not present

## 2019-01-18 LAB — POCT INR: INR: 3.1 — AB (ref 2.0–3.0)

## 2019-01-18 NOTE — Telephone Encounter (Signed)

## 2019-01-18 NOTE — Patient Instructions (Signed)
Description   Continue taking 1/2 tablet daily except 1 tablet on Mondays. Will recheck INR in 1 week pending DCCV.

## 2019-01-24 ENCOUNTER — Ambulatory Visit
Admission: RE | Admit: 2019-01-24 | Discharge: 2019-01-24 | Disposition: A | Discharge status: Home / Self Care | Payer: Medicare Other | Source: Ambulatory Visit | Attending: Gastroenterology | Admitting: Gastroenterology

## 2019-01-24 DIAGNOSIS — R195 Other fecal abnormalities: Secondary | ICD-10-CM

## 2019-01-25 ENCOUNTER — Other Ambulatory Visit: Payer: Self-pay

## 2019-01-25 ENCOUNTER — Ambulatory Visit (INDEPENDENT_AMBULATORY_CARE_PROVIDER_SITE_OTHER): Payer: Medicare Other | Admitting: *Deleted

## 2019-01-25 DIAGNOSIS — I4891 Unspecified atrial fibrillation: Secondary | ICD-10-CM

## 2019-01-25 DIAGNOSIS — Z5181 Encounter for therapeutic drug level monitoring: Secondary | ICD-10-CM | POA: Diagnosis not present

## 2019-01-25 LAB — POCT INR: INR: 4.1 — AB (ref 2.0–3.0)

## 2019-01-25 NOTE — Patient Instructions (Addendum)
Description   Hold today then continue taking 1/2 tablet daily except 1 tablet on Mondays. Will recheck INR in 1 week pending DCCV.

## 2019-01-26 ENCOUNTER — Telehealth: Payer: Self-pay

## 2019-01-26 NOTE — Telephone Encounter (Signed)

## 2019-02-02 ENCOUNTER — Ambulatory Visit (INDEPENDENT_AMBULATORY_CARE_PROVIDER_SITE_OTHER): Payer: Medicare Other | Admitting: *Deleted

## 2019-02-02 ENCOUNTER — Telehealth: Payer: Self-pay

## 2019-02-02 ENCOUNTER — Other Ambulatory Visit: Payer: Self-pay

## 2019-02-02 DIAGNOSIS — Z5181 Encounter for therapeutic drug level monitoring: Secondary | ICD-10-CM | POA: Diagnosis not present

## 2019-02-02 DIAGNOSIS — I4891 Unspecified atrial fibrillation: Secondary | ICD-10-CM

## 2019-02-02 LAB — POCT INR: INR: 2.5 (ref 2.0–3.0)

## 2019-02-02 NOTE — Patient Instructions (Signed)
Description   Take 1 tab today and then continue taking 1/2 tablet daily except 1 tablet on Mondays. Will recheck INR in 1 week pending DCCV.

## 2019-02-02 NOTE — Telephone Encounter (Signed)

## 2019-02-06 ENCOUNTER — Other Ambulatory Visit (HOSPITAL_COMMUNITY)
Admission: RE | Admit: 2019-02-06 | Discharge: 2019-02-06 | Disposition: A | Discharge status: Home / Self Care | Payer: Medicare Other | Source: Ambulatory Visit | Attending: Internal Medicine | Admitting: Internal Medicine

## 2019-02-06 ENCOUNTER — Other Ambulatory Visit: Payer: Self-pay

## 2019-02-06 ENCOUNTER — Other Ambulatory Visit: Payer: Medicare Other | Admitting: *Deleted

## 2019-02-06 DIAGNOSIS — I4819 Other persistent atrial fibrillation: Secondary | ICD-10-CM

## 2019-02-06 DIAGNOSIS — Z1159 Encounter for screening for other viral diseases: Secondary | ICD-10-CM | POA: Diagnosis present

## 2019-02-06 LAB — CBC WITH DIFFERENTIAL/PLATELET
Basophils Absolute: 0.1 10*3/uL (ref 0.0–0.2)
Basos: 1 %
EOS (ABSOLUTE): 0.1 10*3/uL (ref 0.0–0.4)
Eos: 2 %
Hematocrit: 39.1 % (ref 34.0–46.6)
Hemoglobin: 12 g/dL (ref 11.1–15.9)
Immature Grans (Abs): 0 10*3/uL (ref 0.0–0.1)
Immature Granulocytes: 0 %
Lymphocytes Absolute: 2 10*3/uL (ref 0.7–3.1)
Lymphs: 38 %
MCH: 23.7 pg — ABNORMAL LOW (ref 26.6–33.0)
MCHC: 30.7 g/dL — ABNORMAL LOW (ref 31.5–35.7)
MCV: 77 fL — ABNORMAL LOW (ref 79–97)
Monocytes Absolute: 0.6 10*3/uL (ref 0.1–0.9)
Monocytes: 11 %
Neutrophils Absolute: 2.5 10*3/uL (ref 1.4–7.0)
Neutrophils: 48 %
Platelets: 316 10*3/uL (ref 150–450)
RBC: 5.06 x10E6/uL (ref 3.77–5.28)
RDW: 16.4 % — ABNORMAL HIGH (ref 11.7–15.4)
WBC: 5.4 10*3/uL (ref 3.4–10.8)

## 2019-02-06 LAB — BASIC METABOLIC PANEL
BUN/Creatinine Ratio: 16 (ref 12–28)
BUN: 16 mg/dL (ref 8–27)
CO2: 23 mmol/L (ref 20–29)
Calcium: 9.8 mg/dL (ref 8.7–10.3)
Chloride: 102 mmol/L (ref 96–106)
Creatinine, Ser: 0.99 mg/dL (ref 0.57–1.00)
GFR calc Af Amer: 63 mL/min/{1.73_m2} (ref >59)
GFR calc non Af Amer: 55 mL/min/{1.73_m2} — ABNORMAL LOW (ref >59)
Glucose: 98 mg/dL (ref 65–99)
Potassium: 4.5 mmol/L (ref 3.5–5.2)
Sodium: 139 mmol/L (ref 134–144)

## 2019-02-06 LAB — PROTIME-INR
INR: 2.9 — ABNORMAL HIGH (ref 0.8–1.2)
Prothrombin Time: 29.5 s — ABNORMAL HIGH (ref 9.1–12.0)

## 2019-02-06 LAB — SARS CORONAVIRUS 2 (TAT 6-24 HRS): SARS Coronavirus 2: NEGATIVE

## 2019-02-08 ENCOUNTER — Other Ambulatory Visit: Payer: Self-pay | Admitting: *Deleted

## 2019-02-08 DIAGNOSIS — I513 Intracardiac thrombosis, not elsewhere classified: Secondary | ICD-10-CM

## 2019-02-08 DIAGNOSIS — I4819 Other persistent atrial fibrillation: Secondary | ICD-10-CM

## 2019-02-08 NOTE — Progress Notes (Signed)
Spoke with patient re: self quarantine. Patient has been at home and not been around any one outside the household. Confirmed pre op instructions and NPO. Patient understands.

## 2019-02-09 ENCOUNTER — Encounter (HOSPITAL_COMMUNITY): Payer: Self-pay | Admitting: *Deleted

## 2019-02-09 ENCOUNTER — Ambulatory Visit (HOSPITAL_COMMUNITY)
Admission: RE | Admit: 2019-02-09 | Discharge: 2019-02-09 | Disposition: A | Discharge status: Home / Self Care | Payer: Medicare Other | Attending: Internal Medicine | Admitting: Internal Medicine

## 2019-02-09 ENCOUNTER — Encounter (HOSPITAL_COMMUNITY)
Admission: RE | Disposition: A | Discharge status: Home / Self Care | Payer: Self-pay | Source: Home / Self Care | Attending: Internal Medicine

## 2019-02-09 ENCOUNTER — Ambulatory Visit (INDEPENDENT_AMBULATORY_CARE_PROVIDER_SITE_OTHER): Payer: Medicare Other | Admitting: *Deleted

## 2019-02-09 ENCOUNTER — Ambulatory Visit (HOSPITAL_BASED_OUTPATIENT_CLINIC_OR_DEPARTMENT_OTHER): Payer: Medicare Other

## 2019-02-09 ENCOUNTER — Other Ambulatory Visit: Payer: Self-pay

## 2019-02-09 ENCOUNTER — Ambulatory Visit (HOSPITAL_COMMUNITY): Payer: Medicare Other | Admitting: Anesthesiology

## 2019-02-09 DIAGNOSIS — I517 Cardiomegaly: Secondary | ICD-10-CM

## 2019-02-09 DIAGNOSIS — Z7901 Long term (current) use of anticoagulants: Secondary | ICD-10-CM | POA: Insufficient documentation

## 2019-02-09 DIAGNOSIS — I4891 Unspecified atrial fibrillation: Secondary | ICD-10-CM | POA: Diagnosis not present

## 2019-02-09 DIAGNOSIS — Z885 Allergy status to narcotic agent status: Secondary | ICD-10-CM | POA: Insufficient documentation

## 2019-02-09 DIAGNOSIS — Z79899 Other long term (current) drug therapy: Secondary | ICD-10-CM | POA: Diagnosis not present

## 2019-02-09 DIAGNOSIS — E7800 Pure hypercholesterolemia, unspecified: Secondary | ICD-10-CM | POA: Insufficient documentation

## 2019-02-09 DIAGNOSIS — I420 Dilated cardiomyopathy: Secondary | ICD-10-CM | POA: Insufficient documentation

## 2019-02-09 DIAGNOSIS — I480 Paroxysmal atrial fibrillation: Secondary | ICD-10-CM | POA: Insufficient documentation

## 2019-02-09 DIAGNOSIS — Z5181 Encounter for therapeutic drug level monitoring: Secondary | ICD-10-CM

## 2019-02-09 DIAGNOSIS — K219 Gastro-esophageal reflux disease without esophagitis: Secondary | ICD-10-CM | POA: Diagnosis not present

## 2019-02-09 DIAGNOSIS — Z8249 Family history of ischemic heart disease and other diseases of the circulatory system: Secondary | ICD-10-CM | POA: Diagnosis not present

## 2019-02-09 DIAGNOSIS — I4819 Other persistent atrial fibrillation: Secondary | ICD-10-CM | POA: Diagnosis not present

## 2019-02-09 DIAGNOSIS — I340 Nonrheumatic mitral (valve) insufficiency: Secondary | ICD-10-CM

## 2019-02-09 DIAGNOSIS — D3502 Benign neoplasm of left adrenal gland: Secondary | ICD-10-CM | POA: Diagnosis not present

## 2019-02-09 DIAGNOSIS — I10 Essential (primary) hypertension: Secondary | ICD-10-CM | POA: Insufficient documentation

## 2019-02-09 DIAGNOSIS — M810 Age-related osteoporosis without current pathological fracture: Secondary | ICD-10-CM | POA: Diagnosis not present

## 2019-02-09 DIAGNOSIS — Z86718 Personal history of other venous thrombosis and embolism: Secondary | ICD-10-CM | POA: Insufficient documentation

## 2019-02-09 DIAGNOSIS — I513 Intracardiac thrombosis, not elsewhere classified: Secondary | ICD-10-CM

## 2019-02-09 HISTORY — PX: TEE WITHOUT CARDIOVERSION: SHX5443

## 2019-02-09 HISTORY — PX: CARDIOVERSION: SHX1299

## 2019-02-09 LAB — POCT INR: INR: 3.9 — AB (ref 2.0–3.0)

## 2019-02-09 SURGERY — CARDIOVERSION
Anesthesia: Monitor Anesthesia Care

## 2019-02-09 MED ORDER — PROPOFOL 500 MG/50ML IV EMUL
INTRAVENOUS | Status: DC | PRN
  Administered 2019-02-09: 75 ug/kg/min via INTRAVENOUS

## 2019-02-09 MED ORDER — SODIUM CHLORIDE 0.9 % IV SOLN
INTRAVENOUS | Status: DC
  Administered 2019-02-09: 500 mL via INTRAVENOUS

## 2019-02-09 MED ORDER — BUTAMBEN-TETRACAINE-BENZOCAINE 2-2-14 % EX AERO
INHALATION_SPRAY | CUTANEOUS | Status: DC | PRN
  Administered 2019-02-09: 13:00:00 2 via TOPICAL

## 2019-02-09 MED ORDER — PROPOFOL 10 MG/ML IV BOLUS
INTRAVENOUS | Status: DC | PRN
  Administered 2019-02-09 (×2): 20 mg via INTRAVENOUS
  Administered 2019-02-09: 15 mg via INTRAVENOUS

## 2019-02-09 MED ORDER — LIDOCAINE HCL (CARDIAC) PF 100 MG/5ML IV SOSY
PREFILLED_SYRINGE | INTRAVENOUS | Status: DC | PRN
  Administered 2019-02-09: 80 mg via INTRAVENOUS

## 2019-02-09 NOTE — Patient Instructions (Addendum)
Description   Hold tonight's dose and then continue taking 1/2 tablet daily except 1 tablet on Mondays. Call coumadin clinic [phone] for any procedures or any changes in medication. Main office # [phone]

## 2019-02-09 NOTE — Progress Notes (Signed)
  Echocardiogram Echocardiogram Transesophageal has been performed.  Monica [name] 02/09/2019, 1:46 PM

## 2019-02-09 NOTE — Transfer of Care (Signed)
Immediate Anesthesia Transfer of Care Note  Patient: Monica [name]  Procedure(s) Performed: CARDIOVERSION (N/A ) TRANSESOPHAGEAL ECHOCARDIOGRAM (TEE) (N/A )  Patient Location: Endoscopy Unit  Anesthesia Type:General  Level of Consciousness: drowsy  Airway & Oxygen Therapy: Patient Spontanous Breathing and Patient connected to nasal cannula oxygen  Post-op Assessment: Report given to RN, Post -op Vital signs reviewed and stable and Patient moving all extremities  Post vital signs: Reviewed and stable  Last Vitals:  Vitals Value Taken Time  BP 120/63 02/09/19 1347  Temp 36.4 C 02/09/19 1347  Pulse 60 02/09/19 1352  Resp 11 02/09/19 1352  SpO2 100 % 02/09/19 1352  Vitals shown include unvalidated device data.  Last Pain:  Vitals:   02/09/19 1347  TempSrc: Oral  PainSc: 0-No pain         Complications: No apparent anesthesia complications

## 2019-02-09 NOTE — Anesthesia Preprocedure Evaluation (Addendum)
Anesthesia Evaluation  Patient identified by MRN, date of birth, ID band Patient awake    Reviewed: Allergy & Precautions, NPO status , Patient's Chart, lab work & pertinent test results  Airway Mallampati: II  TM Distance: <3 FB Neck ROM: Full    Dental no notable dental hx. (+) Teeth Intact, Dental Advisory Given   Pulmonary neg pulmonary ROS,    Pulmonary exam normal        Cardiovascular hypertension, Pt. on medications and Pt. on home beta blockers + dysrhythmias Atrial Fibrillation  Rhythm:Irregular Rate:Normal     Neuro/Psych negative neurological ROS  negative psych ROS   GI/Hepatic Neg liver ROS, GERD  Medicated and Controlled,  Endo/Other  negative endocrine ROS  Renal/GU negative Renal ROS     Musculoskeletal negative musculoskeletal ROS (+)   Abdominal   Peds  Hematology HLD   Anesthesia Other Findings a fib  Reproductive/Obstetrics                          Anesthesia Physical  Anesthesia Plan  ASA: III  Anesthesia Plan: MAC   Post-op Pain Management:    Induction: Intravenous  PONV Risk Score and Plan: 2 and Propofol infusion and Treatment may vary due to age or medical condition  Airway Management Planned: Nasal Cannula and Natural Airway  Additional Equipment: None  Intra-op Plan:   Post-operative Plan:   Informed Consent: I have reviewed the patients History and Physical, chart, labs and discussed the procedure including the risks, benefits and alternatives for the proposed anesthesia with the patient or authorized representative who has indicated his/her understanding and acceptance.       Plan Discussed with:   Anesthesia Plan Comments:        Anesthesia Quick Evaluation

## 2019-02-09 NOTE — Discharge Instructions (Signed)
Electrical Cardioversion, Care After °This sheet gives you information about how to care for yourself after your procedure. Your health care provider may also give you more specific instructions. If you have problems or questions, contact your health care provider. °What can I expect after the procedure? °After the procedure, it is common to have: °· Some redness on the skin where the shocks were given. °Follow these instructions at home: ° °· Do not drive for 24 hours if you were given a medicine to help you relax (sedative). °· Take over-the-counter and prescription medicines only as told by your health care provider. °· Ask your health care provider how to check your pulse. Check it often. °· Rest for 48 hours after the procedure or as told by your health care provider. °· Avoid or limit your caffeine use as told by your health care provider. °Contact a health care provider if: °· You feel like your heart is beating too quickly or your pulse is not regular. °· You have a serious muscle cramp that does not go away. °Get help right away if: ° °· You have discomfort in your chest. °· You are dizzy or you feel faint. °· You have trouble breathing or you are short of breath. °· Your speech is slurred. °· You have trouble moving an arm or leg on one side of your body. °· Your fingers or toes turn cold or blue. °This information is not intended to replace advice given to you by your health care provider. Make sure you discuss any questions you have with your health care provider. °Document Released: 05/24/2013 Document Revised: 03/06/2016 Document Reviewed: 02/07/2016 °Elsevier Interactive Patient Education © 2019 [employer] ° °

## 2019-02-09 NOTE — H&P (Signed)
INTERVAL PROCEDURAL HISTORY & PHYSICAL  Patient Name: Monica [name] Date of Encounter: 02/09/2019 Primary Care Physician: Carol Ada, MD Cardiologist: Fransico Him, MD  Chief Complaint   Atrial fibrillation  Patient Profile   78 yo female with afib and LA thrombus, anticoagulated on warfarin  HPI   This is a 78 y.o. female patient of Dr. Radford Pax with history of afib who underwent TEE for DCCV in February and was found to have probably LA thrombus. She has since been therapeutically anticoagulated on warfarin, but remains in afib. She is now referred for repeat TEE and possible DCCV.   PMHx   Past Medical History:  Diagnosis Date  . Adrenal adenoma 6/11   lt on ct 6/11  . GERD (gastroesophageal reflux disease)   . Hypercholesteremia   . Hypertension   . Kidney stone 6/11   rt. on ct 6/11  . Osteoporosis   . Paroxysmal atrial fibrillation ([address])    a. PVI 10/13 b. PVI 12/15    Past Surgical History:  Procedure Laterality Date  . ATRIAL FIBRILLATION ABLATION N/A 06/07/2012   PVI and CTI ablation by Dr Rayann Heman  . ATRIAL FIBRILLATION ABLATION N/A 07/31/2014   PVI by Dr Rayann Heman; initial ablation 2013  . CARDIOVERSION  05/06/2012   Procedure: CARDIOVERSION;  Surgeon: Sueanne Margarita, MD;  [facility];  Service: Cardiovascular;  Laterality: N/A;  h/p in file drawer  . CARDIOVERSION N/A 10/06/2017   Procedure: CARDIOVERSION;  Surgeon: Skeet Latch, MD;  Location: [facility] ENDOSCOPY;  Service: Cardiovascular;  Laterality: N/A;  . CARDIOVERSION N/A 03/28/2018   Procedure: CARDIOVERSION;  Surgeon: Dorothy Spark, MD;  Location: [facility] ENDOSCOPY;  Service: Cardiovascular;  Laterality: N/A;  . CESAREAN SECTION  [phone]   x-2  . CHOLECYSTECTOMY  1989  . ELECTROPHYSIOLOGIC STUDY N/A 09/08/2016   Procedure: Cardioversion;  Surgeon: Thompson Grayer, MD;  Location: [place] CV LAB;  Service: Cardiovascular;  Laterality: N/A;  . EP IMPLANTABLE DEVICE N/A 09/08/2016   Procedure: Loop Recorder Removal;  Surgeon: Thompson Grayer, MD;  Location: [place] CV LAB;  Service: Cardiovascular;  Laterality: N/A;  . EP IMPLANTABLE DEVICE N/A 09/08/2016   Procedure: Pacemaker Implant;  Surgeon: Thompson Grayer, MD;  Location: [place] CV LAB;  Service: Cardiovascular;  Laterality: N/A;  . Implantable loop recorder implantation  05/30/14   Medtronic Reveal LINQ implanted by Dr Rayann Heman with RIO II protocol (ambulatory setting)  . TEE WITHOUT CARDIOVERSION  06/06/2012   Procedure: TRANSESOPHAGEAL ECHOCARDIOGRAM (TEE);  Surgeon: Jettie Booze, MD;  Location: [facility];  Service: Cardiovascular;  Laterality: N/A;  . TEE WITHOUT CARDIOVERSION N/A 07/30/2014   Procedure: TRANSESOPHAGEAL ECHOCARDIOGRAM (TEE);  Surgeon: Larey Dresser, MD;  Location: [place];  Service: Cardiovascular;  Laterality: N/A;  . TEE WITHOUT CARDIOVERSION N/A 10/06/2017   Procedure: TRANSESOPHAGEAL ECHOCARDIOGRAM (TEE);  Surgeon: Skeet Latch, MD;  Location: [place];  Service: Cardiovascular;  Laterality: N/A;  . TEE WITHOUT CARDIOVERSION N/A 03/28/2018   Procedure: TRANSESOPHAGEAL ECHOCARDIOGRAM (TEE);  Surgeon: Dorothy Spark, MD;  Location: [facility] ENDOSCOPY;  Service: Cardiovascular;  Laterality: N/A;  . TEE WITHOUT CARDIOVERSION N/A 09/30/2018   Procedure: TRANSESOPHAGEAL ECHOCARDIOGRAM (TEE);  Surgeon: Elouise Munroe, MD;  Location: [facility] ENDOSCOPY;  Service: Cardiovascular;  Laterality: N/A;    FAMHx   Family History  Problem Relation Age of Onset  . Heart attack Father   . CAD Father   . Hip fracture Mother        complication  .  Lung cancer Brother   . Heart attack Brother        massive  . Other Brother        mva  . Thyroid cancer Sister   . Atrial fibrillation Sister   . Ovarian cancer Sister        Shelda Altes    SOCHx    reports that she has never smoked. She has never used smokeless tobacco. She reports that she does not drink alcohol or use drugs.   Outpatient Medications   No current facility-administered medications on file prior to encounter.    Current Outpatient Medications on File Prior to Encounter  Medication Sig Dispense Refill  . acetaminophen (TYLENOL) 500 MG tablet Take 250-500 mg by mouth daily as needed for moderate pain.     Marland Kitchen alum & mag hydroxide-simeth (MAALOX/MYLANTA) 200-200-20 MG/5ML suspension Take 15 mLs by mouth every 6 (six) hours as needed for indigestion or heartburn.    . calcium carbonate (OS-CAL) 600 MG TABS tablet Take 600 mg by mouth 2 (two) times daily with a meal.     . cholecalciferol (VITAMIN D) 1000 UNITS tablet Take 1,000 Units by mouth every evening.     Marland Kitchen losartan (COZAAR) 100 MG tablet Take 0.5 tablets (50 mg total) by mouth daily. (Patient taking differently: Take 50 mg by mouth every evening. ) 90 tablet 3  . metoprolol tartrate (LOPRESSOR) 50 MG tablet Take 50 mg by mouth 2 (two) times daily.    . pantoprazole (PROTONIX) 40 MG tablet Take 40 mg by mouth daily before breakfast.     . Polyethyl Glycol-Propyl Glycol (SYSTANE ULTRA PF OP) Place 1 drop into both eyes 2 (two) times a day.    . pravastatin (PRAVACHOL) 20 MG tablet Take 20 mg by mouth every evening.   1  . sotalol (BETAPACE) 80 MG tablet TAKE 1 TABLET (80 MG TOTAL) BY MOUTH 2 (TWO) TIMES DAILY. 180 tablet 2  . traZODone (DESYREL) 50 MG tablet Take 50 mg by mouth at bedtime.     Marland Kitchen warfarin (COUMADIN) 5 MG tablet Take 1/2 tablet daily or as directed by the Coumadin clinic (Patient taking differently: Take 2.5-5 mg by mouth See admin instructions. Take 2.5 mg daily in the evening except on Mondays take 5 mg in the evening) 50 tablet 1  . metoprolol tartrate (LOPRESSOR) 25 MG tablet Take 2 tablets (50 mg total) by mouth 2 (two) times daily. (Patient not taking: Reported on 02/03/2019) 270 tablet 2    Inpatient Medications    Scheduled Meds:   Continuous Infusions:   PRN Meds:    ALLERGIES   Allergies  Allergen Reactions  .  Morphine And Related Nausea And Vomiting  . Codeine Nausea And Vomiting    ROS   Pertinent items noted in HPI and remainder of comprehensive ROS otherwise negative.  Vitals   There were no vitals filed for this visit. No intake or output data in the 24 hours ending 02/09/19 1059 There were no vitals filed for this visit.  Physical Exam   General appearance: alert and no distress Lungs: clear to auscultation bilaterally Heart: irregularly irregular rhythm Extremities: extremities normal, atraumatic, no cyanosis or edema Neurologic: Grossly normal  Labs   No results found for this or any previous visit (from the past 61 hour(s)).  ECG   N/A  Telemetry   N/A  Radiology   No results found.  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 78 yo female with persistent atrial fibrillation and history of LAA thrombus on warfarin. Plan for TEE today to evaluate resolution of thrombus and possible DCCV. Last 5 INR's between 12/14/2018 and 02/06/2019 ranged between 2.5 and 4.1. I discussed the risks, benefits and alteratives of TEE and DCCV and she is willing to proceed.  Time Spent Directly with Patient:  I have spent a total of 25 minutes with patient reviewing hospital notes, telemetry, EKGs, labs and examining the patient as well as establishing an assessment and plan that was discussed with the patient.  > 50% of time was spent in direct patient care.   Length of Stay:  LOS: 0 days   Pixie Casino, MD, [facility], [place] Director of the Advanced Lipid Disorders &  Cardiovascular Risk Reduction Clinic Diplomate of the American Board of Clinical Lipidology Attending Cardiologist  Direct Dial: [phone]  Fax: [phone]  Website:  [email or web address] 02/09/2019, 10:59 AM

## 2019-02-09 NOTE — CV Procedure (Signed)
TEE/CARDIOVERSION NOTE  TRANSESOPHAGEAL ECHOCARDIOGRAM (TEE):  Indictation: Atrial Fibrillation  Consent:   Informed consent was obtained prior to the procedure. The risks, benefits and alternatives for the procedure were discussed and the patient comprehended these risks.  Risks include, but are not limited to, cough, sore throat, vomiting, nausea, somnolence, esophageal and stomach trauma or perforation, bleeding, low blood pressure, aspiration, pneumonia, infection, trauma to the teeth and death.    Time Out: Verified patient identification, verified procedure, site/side was marked, verified correct patient position, special equipment/implants available, medications/allergies/relevent history reviewed, required imaging and test results available. Performed  Procedure:  After a procedural time-out, the patient was given propofol per anesthesia for sedation. The patient's heart rate, blood pressure, and oxygen saturation are monitored continuously during the procedure. The transesophageal probe was inserted in the esophagus and stomach without difficulty and multiple views were obtained. Agitated microbubble saline contrast was not administered.  Complications:    Complications: None Patient did tolerate procedure well.  Findings:  1. LEFT VENTRICLE: The left ventricular wall thickness is normal.  The left ventricular cavity is normal in size. Wall motion is normal.  LVEF is 60-65%.  2. RIGHT VENTRICLE:  The right ventricle is normal in structure and function without any thrombus or masses.    3. LEFT ATRIUM:  The left atrium is moderately dilated in size without any thrombus or masses.  There is not spontaneous echo contrast ("smoke") in the left atrium consistent with a low flow state.  4. LEFT ATRIAL APPENDAGE:  The left atrial appendage is free of any thrombus or masses. It has a chickenwing appearance. The previously visualized thrombus has resolved. The appendage has single  lobes. Pulse doppler indicates moderate flow in the appendage.  5. ATRIAL SEPTUM:  The atrial septum appears intact and is free of thrombus and/or masses.  There is no evidence for interatrial shunting by color doppler and saline microbubble.  6. RIGHT ATRIUM:  The right atrium is moderately dilated in size and function without any thrombus or masses. Pacing wires are noted without thrombus. There is a prominent eustachian valve with mobile filamentous structure, likely chiari network.  7. MITRAL VALVE:  The mitral valve is normal in structure and function with trivial to mild regurgitation.  There were no vegetations or stenosis.  8. AORTIC VALVE:  The aortic valve is trileaflet, normal in structure and function with no regurgitation.  There were no vegetations or stenosis  9. TRICUSPID VALVE:  The tricuspid valve is normal in structure and function with Mild regurgitation.  There were no vegetations or stenosis  10.  PULMONIC VALVE:  The pulmonic valve is normal in structure and function with no regurgitation.  There were no vegetations or stenosis.   11. AORTIC ARCH, ASCENDING AND DESCENDING AORTA:  There was grade 1 Ron Parker et. Al, 1992) atherosclerosis of the ascending aorta, aortic arch, or proximal descending aorta.  12. PULMONARY VEINS: Anomalous pulmonary venous return was not noted.  13. PERICARDIUM: The pericardium appeared normal and non-thickened.  There is no pericardial effusion.  CARDIOVERSION:     Second Time Out: Verified patient identification, verified procedure, site/side was marked, verified correct patient position, special equipment/implants available, medications/allergies/relevent history reviewed, required imaging and test results available.  Performed  Procedure:  The pacemaker was interrogated prior to, during and after cardioversion with the company representative present.  1. Patient placed on cardiac monitor, pulse oximetry, supplemental oxygen as necessary.   2. Sedation administered per anesthesia 3. Pacer pads placed  anterior and posterior chest. 4. Cardioverted 1 time(s).  5. Cardioverted at 150J biphasic.  Complications:  Complications: None Patient did tolerate procedure well.  Impression:  1. Resolved LAA thrombus. 2. Successful DCCV with a single 150J biphasic shock to AV paced rhythm.  Recommendations:  1.  Follow-up with Drs. Turner and Allred.  Time Spent Directly with the Patient:  60 minutes   Monica Casino, MD, [facility], [place] Director of the Advanced Lipid Disorders &  Cardiovascular Risk Reduction Clinic Diplomate of the American Board of Clinical Lipidology Attending Cardiologist  Direct Dial: [phone]  Fax: [phone]  Website:  [email or web address] 02/09/2019, 1:44 PM

## 2019-02-10 NOTE — Anesthesia Postprocedure Evaluation (Signed)
Anesthesia Post Note  Patient: Monica [name]  Procedure(s) Performed: CARDIOVERSION (N/A ) TRANSESOPHAGEAL ECHOCARDIOGRAM (TEE) (N/A )     Patient location during evaluation: Endoscopy Anesthesia Type: MAC Level of consciousness: awake and alert Pain management: pain level controlled Vital Signs Assessment: post-procedure vital signs reviewed and stable Respiratory status: spontaneous breathing, nonlabored ventilation and respiratory function stable Cardiovascular status: blood pressure returned to baseline and stable Postop Assessment: no apparent nausea or vomiting Anesthetic complications: no    Last Vitals:  Vitals:   02/09/19 1400 02/09/19 1410  BP: (!) 161/78 (!) 169/64  Pulse: 60 60  Resp: 17 15  Temp:    SpO2: 99% 100%    Last Pain:  Vitals:   02/09/19 1410  TempSrc:   PainSc: 0-No pain   Pain Goal:                   Lidia Collum

## 2019-02-16 ENCOUNTER — Other Ambulatory Visit: Payer: Self-pay

## 2019-02-16 ENCOUNTER — Ambulatory Visit (INDEPENDENT_AMBULATORY_CARE_PROVIDER_SITE_OTHER): Payer: Medicare Other | Admitting: *Deleted

## 2019-02-16 DIAGNOSIS — I4891 Unspecified atrial fibrillation: Secondary | ICD-10-CM | POA: Diagnosis not present

## 2019-02-16 DIAGNOSIS — Z5181 Encounter for therapeutic drug level monitoring: Secondary | ICD-10-CM | POA: Diagnosis not present

## 2019-02-16 LAB — POCT INR: INR: 2.9 (ref 2.0–3.0)

## 2019-02-16 NOTE — Patient Instructions (Addendum)
Description    Continue taking 1/2 tablet daily except 1 tablet on Mondays. Call coumadin clinic [phone] for any procedures or any changes in medication. Main office # [phone]

## 2019-02-24 ENCOUNTER — Other Ambulatory Visit: Payer: Self-pay

## 2019-02-24 ENCOUNTER — Ambulatory Visit (HOSPITAL_COMMUNITY)
Admission: RE | Admit: 2019-02-24 | Discharge: 2019-02-24 | Disposition: A | Discharge status: Home / Self Care | Payer: Medicare Other | Source: Ambulatory Visit | Attending: Nurse Practitioner | Admitting: Nurse Practitioner

## 2019-02-24 ENCOUNTER — Encounter (HOSPITAL_COMMUNITY): Payer: Self-pay | Admitting: Nurse Practitioner

## 2019-02-24 VITALS — BP 142/82 | HR 64 | Ht 63.0 in | Wt 152.0 lb

## 2019-02-24 DIAGNOSIS — Z8249 Family history of ischemic heart disease and other diseases of the circulatory system: Secondary | ICD-10-CM | POA: Insufficient documentation

## 2019-02-24 DIAGNOSIS — I4819 Other persistent atrial fibrillation: Secondary | ICD-10-CM

## 2019-02-24 DIAGNOSIS — E7800 Pure hypercholesterolemia, unspecified: Secondary | ICD-10-CM | POA: Insufficient documentation

## 2019-02-24 DIAGNOSIS — Z950 Presence of cardiac pacemaker: Secondary | ICD-10-CM | POA: Diagnosis not present

## 2019-02-24 DIAGNOSIS — Z7901 Long term (current) use of anticoagulants: Secondary | ICD-10-CM | POA: Insufficient documentation

## 2019-02-24 DIAGNOSIS — I10 Essential (primary) hypertension: Secondary | ICD-10-CM | POA: Diagnosis not present

## 2019-02-24 DIAGNOSIS — Z79899 Other long term (current) drug therapy: Secondary | ICD-10-CM | POA: Insufficient documentation

## 2019-02-24 DIAGNOSIS — I480 Paroxysmal atrial fibrillation: Secondary | ICD-10-CM | POA: Diagnosis not present

## 2019-02-24 DIAGNOSIS — Z87442 Personal history of urinary calculi: Secondary | ICD-10-CM | POA: Insufficient documentation

## 2019-02-24 DIAGNOSIS — Z885 Allergy status to narcotic agent status: Secondary | ICD-10-CM | POA: Insufficient documentation

## 2019-02-24 DIAGNOSIS — K219 Gastro-esophageal reflux disease without esophagitis: Secondary | ICD-10-CM | POA: Insufficient documentation

## 2019-02-24 NOTE — Progress Notes (Signed)
Primary Care Physician: Carol Ada, MD Referring Physician: Dr. Lisette Abu Monica [name] is a 78 y.o. 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 She had an atrial clot noted on TEE back in March when set up for cardioversion which was changed. She then went for cardiac CT which did not clot and because coivd precautions, DCCV was put on hold. She just recently had another TEE which did not show any evidence for atrial thrombus and she went on to have successful cardioversion. She is in SR today and feels improved.  Today, she denies symptoms of  chest pain, shortness of breath, orthopnea, PND, lower extremity edema, dizziness, presyncope, syncope, or neurologic sequela. The patient is tolerating medications without difficulties and is otherwise without complaint today.   Past Medical History:  Diagnosis Date  . Adrenal adenoma 6/11   lt on ct 6/11  . GERD (gastroesophageal reflux disease)   . Hypercholesteremia   . Hypertension   . Kidney stone 6/11   rt. on ct 6/11  . Osteoporosis   . Paroxysmal atrial fibrillation ([place])    a. PVI 10/13 b. PVI 12/15   Past Surgical History:  Procedure Laterality Date  . ATRIAL FIBRILLATION ABLATION N/A 06/07/2012   PVI and CTI ablation by Dr Rayann Heman  . ATRIAL FIBRILLATION ABLATION N/A 07/31/2014   PVI by Dr Rayann Heman; initial ablation 2013  . CARDIOVERSION  05/06/2012   Procedure: CARDIOVERSION;  Surgeon: Sueanne Margarita, MD;  Location: [facility] ENDOSCOPY;  Service: Cardiovascular;  Laterality: N/A;  h/p in file drawer  . CARDIOVERSION N/A 10/06/2017   Procedure: CARDIOVERSION;  Surgeon: Skeet Latch, MD;  Location: [facility] ENDOSCOPY;  Service: Cardiovascular;  Laterality: N/A;  . CARDIOVERSION N/A 03/28/2018   Procedure: CARDIOVERSION;  Surgeon: Dorothy Spark, MD;  Location: [facility] ENDOSCOPY;  Service: Cardiovascular;  Laterality: N/A;  . CARDIOVERSION N/A 02/09/2019   Procedure: CARDIOVERSION;  Surgeon: Pixie Casino, MD;  Location: [place];  Service: Cardiovascular;  Laterality: N/A;  . CESAREAN SECTION  1978-1979   x-2  . CHOLECYSTECTOMY  1989  . ELECTROPHYSIOLOGIC STUDY N/A 09/08/2016   Procedure: Cardioversion;  Surgeon: Thompson Grayer, MD;  Location: [place] CV LAB;  Service: Cardiovascular;  Laterality: N/A;  . EP IMPLANTABLE DEVICE N/A 09/08/2016   Procedure: Loop Recorder Removal;  Surgeon: Thompson Grayer, MD;  Location: [place] CV LAB;  Service: Cardiovascular;  Laterality: N/A;  . EP IMPLANTABLE DEVICE N/A 09/08/2016   Procedure: Pacemaker Implant;  Surgeon: Thompson Grayer, MD;  Location: [place] CV LAB;  Service: Cardiovascular;  Laterality: N/A;  . Implantable loop recorder implantation  05/30/14   Medtronic Reveal LINQ implanted by Dr Rayann Heman with RIO II protocol (ambulatory setting)  . TEE WITHOUT CARDIOVERSION  06/06/2012   Procedure: TRANSESOPHAGEAL ECHOCARDIOGRAM (TEE);  Surgeon: Jettie Booze, MD;  Location: [place];  Service: Cardiovascular;  Laterality: N/A;  . TEE WITHOUT CARDIOVERSION N/A 07/30/2014   Procedure: TRANSESOPHAGEAL ECHOCARDIOGRAM (TEE);  Surgeon: Larey Dresser, MD;  Location: [place];  Service: Cardiovascular;  Laterality: N/A;  . TEE WITHOUT CARDIOVERSION N/A 10/06/2017   Procedure: TRANSESOPHAGEAL ECHOCARDIOGRAM (TEE);  Surgeon: Skeet Latch, MD;  Location: [place];  Service: Cardiovascular;  Laterality: N/A;  . TEE WITHOUT CARDIOVERSION N/A 03/28/2018   Procedure: TRANSESOPHAGEAL ECHOCARDIOGRAM (TEE);  Surgeon: Dorothy Spark, MD;  Location: [facility] ENDOSCOPY;  Service: Cardiovascular;  Laterality: N/A;  . TEE WITHOUT CARDIOVERSION N/A 09/30/2018   Procedure: TRANSESOPHAGEAL ECHOCARDIOGRAM (TEE);  Surgeon: Elouise Munroe, MD;  Location: [facility] ENDOSCOPY;  Service: Cardiovascular;  Laterality: N/A;  . TEE WITHOUT CARDIOVERSION N/A 02/09/2019   Procedure: TRANSESOPHAGEAL ECHOCARDIOGRAM (TEE);  Surgeon: Pixie Casino, MD;   Location: [facility] ENDOSCOPY;  Service: Cardiovascular;  Laterality: N/A;    Current Outpatient Medications  Medication Sig Dispense Refill  . acetaminophen (TYLENOL) 500 MG tablet Take 250-500 mg by mouth daily as needed for moderate pain.     Marland Kitchen alum & mag hydroxide-simeth (MAALOX/MYLANTA) 200-200-20 MG/5ML suspension Take 15 mLs by mouth every 6 (six) hours as needed for indigestion or heartburn.    . calcium carbonate (OS-CAL) 600 MG TABS tablet Take 600 mg by mouth 2 (two) times daily with a meal.     . cholecalciferol (VITAMIN D) 1000 UNITS tablet Take 1,000 Units by mouth every evening.     Marland Kitchen losartan (COZAAR) 100 MG tablet Take 0.5 tablets (50 mg total) by mouth daily. (Patient taking differently: Take 50 mg by mouth every evening. ) 90 tablet 3  . metoprolol tartrate (LOPRESSOR) 50 MG tablet Take 50 mg by mouth 2 (two) times daily.    . pantoprazole (PROTONIX) 40 MG tablet Take 40 mg by mouth daily before breakfast.     . Polyethyl Glycol-Propyl Glycol (SYSTANE ULTRA PF OP) Place 1 drop into both eyes 2 (two) times a day.    . pravastatin (PRAVACHOL) 20 MG tablet Take 20 mg by mouth every evening.   1  . sotalol (BETAPACE) 80 MG tablet TAKE 1 TABLET (80 MG TOTAL) BY MOUTH 2 (TWO) TIMES DAILY. 180 tablet 2  . traZODone (DESYREL) 50 MG tablet Take 50 mg by mouth at bedtime.     Marland Kitchen warfarin (COUMADIN) 5 MG tablet Take 1/2 tablet daily or as directed by the Coumadin clinic (Patient taking differently: Take 2.5-5 mg by mouth See admin instructions. Take 2.5 mg daily in the evening except on Mondays take 5 mg in the evening) 50 tablet 1   No current facility-administered medications for this encounter.     Allergies  Allergen Reactions  . Morphine And Related Nausea And Vomiting  . Codeine Nausea And Vomiting    Social History   Socioeconomic History  . Marital status: Widowed    Spouse name: Not on file  . Number of children: Not on file  . Years of education: Not on file  . Highest  education level: Not on file  Occupational History  . Not on file  Social Needs  . Financial resource strain: Not on file  . Food insecurity    Worry: Not on file    Inability: Not on file  . Transportation needs    Medical: Not on file    Non-medical: Not on file  Tobacco Use  . Smoking status: Never Smoker  . Smokeless tobacco: Never Used  Substance and Sexual Activity  . Alcohol use: No  . Drug use: No  . Sexual activity: Not on file  Lifestyle  . Physical activity    Days per week: Not on file    Minutes per session: Not on file  . Stress: Not on file  Relationships  . Social Herbalist on phone: Not on file    Gets together: Not on file    Attends religious service: Not on file    Active member of club or organization: Not on file    Attends meetings of clubs or organizations: Not on file    Relationship status:  Not on file  . Intimate partner violence    Fear of current or ex partner: Not on file    Emotionally abused: Not on file    Physically abused: Not on file    Forced sexual activity: Not on file  Other Topics Concern  . 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   [place]   Marital Status:single,widowed,2006.    Family History  Problem Relation Age of Onset  . Heart attack Father   . CAD Father   . Hip fracture Mother        complication  . Lung cancer Brother   . Heart attack Brother        massive  . Other Brother        mva  . Thyroid cancer Sister   . Atrial fibrillation Sister   . Ovarian cancer Sister        surviver    ROS- All systems are reviewed and negative except as per the HPI above  Physical Exam: Vitals:   02/24/19 1011  BP: (!) 142/82  Pulse: 64  Weight: 68.9 kg  Height: 5\' 3"  (1.6 m)   Wt Readings from Last 3 Encounters:  02/24/19 68.9 kg  02/09/19 70.3 kg  01/02/19 71.7 kg    Labs: Lab Results  Component Value Date   NA  139 02/06/2019   K 4.5 02/06/2019   CL 102 02/06/2019   CO2 23 02/06/2019   GLUCOSE 98 02/06/2019   BUN 16 02/06/2019   CREATININE 0.99 02/06/2019   CALCIUM 9.8 02/06/2019   MG 2.3 09/28/2018   Lab Results  Component Value Date   INR 2.9 02/16/2019   No results found for: CHOL, HDL, LDLCALC, TRIG   GEN- The patient is well appearing, alert and oriented x 3 today.   Head- normocephalic, atraumatic Eyes-  Sclera clear, conjunctiva pink Ears- hearing intact Oropharynx- clear Neck- supple, no JVP Lymph- no cervical lymphadenopathy Lungs- Clear to ausculation bilaterally, normal work of breathing Heart- irregular rate and rhythm, no murmurs, rubs or gallops, PMI not laterally displaced GI- soft, NT, ND, + BS Extremities- no clubbing, cyanosis, or edema MS- no significant deformity or atrophy Skin- no rash or lesion Psych- euthymic mood, full affect Neuro- strength and sensation are intact  EKG- A fib at 64 bpm, qrs int 72 bpm, qtc 447 ms Epic records reviewed    Assessment and Plan: 1. Afib Recent successful cardioversion No atrial thrombus on TEE Continue sotalol 80 mg bid  Continue metoprolol bid Continue warfarin for a chadsvasc score of at least 4 If she has ERAF, changing to Tikosyn is reasonable   2. HTN Stable today   3. PPM Per device clinic  F/u with Dr. Radford Pax in 3 months   Geroge Baseman. Nashawn Hillock, [facility] [address] [place],  [phone]

## 2019-03-03 ENCOUNTER — Telehealth: Payer: Self-pay | Admitting: *Deleted

## 2019-03-03 NOTE — Telephone Encounter (Signed)
Form faxed 03/03/19.

## 2019-03-03 NOTE — Telephone Encounter (Signed)
   [facility] Pre-operative Risk Assessment    Request for surgical clearance: THIS IS FOR DEVICE CLEARANCE   1. What type of surgery is being performed? CATARACT EXTRACTION OF THE LEFT EYE WITH INTRAOCULAR LENS IMPLANT  2. When is this surgery scheduled? 03/06/19 STAT  3. What type of clearance is required (medical clearance vs. Pharmacy clearance to hold med vs. Both)? PERIOPERATIVE GUIDANCE FOR IMPLANTED CARDIAC DEVICE PROGRAMMING  4. Are there any medications that need to be held prior to surgery and how long? NONE LISTED  5. Practice name and name of physician performing surgery? [facility]  6. What is your office phone number [phone]   7.   What is your office fax number [phone]  8.   Anesthesia type (None, local, MAC, general) ?    Julaine Hua 03/03/2019, 2:32 PM  _________________________________________________________________   (provider comments below)

## 2019-03-06 ENCOUNTER — Ambulatory Visit (INDEPENDENT_AMBULATORY_CARE_PROVIDER_SITE_OTHER): Payer: Medicare Other | Admitting: *Deleted

## 2019-03-06 DIAGNOSIS — I4819 Other persistent atrial fibrillation: Secondary | ICD-10-CM | POA: Diagnosis not present

## 2019-03-06 LAB — CUP PACEART REMOTE DEVICE CHECK
Battery Remaining Longevity: 84 mo
Battery Voltage: 3.01 V
Brady Statistic AP VP Percent: 0.05 %
Brady Statistic AP VS Percent: 90.63 %
Brady Statistic AS VP Percent: 0.03 %
Brady Statistic AS VS Percent: 9.29 %
Brady Statistic RA Percent Paced: 90.4 %
Brady Statistic RV Percent Paced: 0.08 %
Date Time Interrogation Session: 20200720114147
Implantable Lead Implant Date: 20180123
Implantable Lead Implant Date: 20180123
Implantable Lead Location: 753859
Implantable Lead Location: 753860
Implantable Lead Model: 5076
Implantable Lead Model: 5076
Implantable Pulse Generator Implant Date: 20180123
Lead Channel Impedance Value: 323 Ohm
Lead Channel Impedance Value: 418 Ohm
Lead Channel Impedance Value: 456 Ohm
Lead Channel Impedance Value: 608 Ohm
Lead Channel Pacing Threshold Amplitude: 0.625 V
Lead Channel Pacing Threshold Amplitude: 0.75 V
Lead Channel Pacing Threshold Pulse Width: 0.4 ms
Lead Channel Pacing Threshold Pulse Width: 0.4 ms
Lead Channel Sensing Intrinsic Amplitude: 1.625 mV
Lead Channel Sensing Intrinsic Amplitude: 1.625 mV
Lead Channel Sensing Intrinsic Amplitude: 7.25 mV
Lead Channel Sensing Intrinsic Amplitude: 7.25 mV
Lead Channel Setting Pacing Amplitude: 2 V
Lead Channel Setting Pacing Amplitude: 2.5 V
Lead Channel Setting Pacing Pulse Width: 0.4 ms
Lead Channel Setting Sensing Sensitivity: 2 mV

## 2019-03-07 ENCOUNTER — Telehealth: Payer: Self-pay

## 2019-03-07 NOTE — Telephone Encounter (Signed)

## 2019-03-09 ENCOUNTER — Other Ambulatory Visit: Payer: Self-pay

## 2019-03-09 ENCOUNTER — Ambulatory Visit (INDEPENDENT_AMBULATORY_CARE_PROVIDER_SITE_OTHER): Payer: Medicare Other | Admitting: *Deleted

## 2019-03-09 DIAGNOSIS — I4891 Unspecified atrial fibrillation: Secondary | ICD-10-CM

## 2019-03-09 DIAGNOSIS — Z5181 Encounter for therapeutic drug level monitoring: Secondary | ICD-10-CM

## 2019-03-09 LAB — POCT INR: INR: 3.2 — AB (ref 2.0–3.0)

## 2019-03-09 NOTE — Patient Instructions (Signed)
Description   Continue taking 1/2 tablet daily except 1 tablet on Mondays. Recheck INR in 4 weeks. Call coumadin clinic [phone] for any procedures or any changes in medication. Main office # [phone]

## 2019-03-22 ENCOUNTER — Encounter: Payer: Self-pay | Admitting: Cardiology

## 2019-03-22 NOTE — Progress Notes (Signed)
Remote pacemaker transmission.   

## 2019-03-31 ENCOUNTER — Ambulatory Visit (INDEPENDENT_AMBULATORY_CARE_PROVIDER_SITE_OTHER): Payer: Medicare Other | Admitting: Student

## 2019-03-31 ENCOUNTER — Encounter: Payer: Self-pay | Admitting: Student

## 2019-03-31 ENCOUNTER — Other Ambulatory Visit: Payer: Self-pay

## 2019-03-31 VITALS — BP 180/88 | HR 63 | Ht 63.0 in | Wt 152.0 lb

## 2019-03-31 DIAGNOSIS — I4891 Unspecified atrial fibrillation: Secondary | ICD-10-CM | POA: Diagnosis not present

## 2019-03-31 LAB — CUP PACEART INCLINIC DEVICE CHECK
Battery Remaining Longevity: 84 mo
Battery Voltage: 3.01 V
Brady Statistic AP VP Percent: 0.1 % — CL
Brady Statistic AP VS Percent: 91.1 %
Brady Statistic AS VP Percent: 0.3 %
Brady Statistic AS VS Percent: 8.5 %
Date Time Interrogation Session: 20200814152833
Implantable Lead Implant Date: 20180123
Implantable Lead Implant Date: 20180123
Implantable Lead Location: 753859
Implantable Lead Location: 753860
Implantable Lead Model: 5076
Implantable Lead Model: 5076
Implantable Pulse Generator Implant Date: 20180123
Lead Channel Impedance Value: 418 Ohm
Lead Channel Impedance Value: 627 Ohm
Lead Channel Pacing Threshold Amplitude: 0.625 V
Lead Channel Pacing Threshold Amplitude: 0.75 V
Lead Channel Pacing Threshold Pulse Width: 0.4 ms
Lead Channel Pacing Threshold Pulse Width: 0.4 ms
Lead Channel Setting Pacing Amplitude: 2 V
Lead Channel Setting Pacing Amplitude: 2.5 V
Lead Channel Setting Pacing Pulse Width: 0.4 ms
Lead Channel Setting Sensing Sensitivity: 2 mV

## 2019-03-31 LAB — BASIC METABOLIC PANEL
BUN/Creatinine Ratio: 17 (ref 12–28)
BUN: 14 mg/dL (ref 8–27)
CO2: 25 mmol/L (ref 20–29)
Calcium: 10.1 mg/dL (ref 8.7–10.3)
Chloride: 100 mmol/L (ref 96–106)
Creatinine, Ser: 0.84 mg/dL (ref 0.57–1.00)
GFR calc Af Amer: 76 mL/min/{1.73_m2} (ref >59)
GFR calc non Af Amer: 66 mL/min/{1.73_m2} (ref >59)
Glucose: 77 mg/dL (ref 65–99)
Potassium: 4.3 mmol/L (ref 3.5–5.2)
Sodium: 138 mmol/L (ref 134–144)

## 2019-03-31 LAB — MAGNESIUM: Magnesium: 2.1 mg/dL (ref 1.6–2.3)

## 2019-03-31 MED ORDER — AMLODIPINE BESYLATE 2.5 MG PO TABS: 2.5000 mg | ORAL_TABLET | Freq: Every day | ORAL | 3 refills | Status: DC

## 2019-03-31 NOTE — Progress Notes (Signed)
Electrophysiology Office Note Date: 03/31/2019  ID:  Monica [name], DOB 23-Nov-1940, MRN [id number]  PCP: Carol Ada, MD Primary Cardiologist: Fransico Him, MD Electrophysiologist: Thompson Grayer, MD  CC: Pacemaker follow-up  Monica [name] is a 78 y.o. female seen today for Dr. Rayann Heman. She presents today for routine electrophysiology followup.  Since last being seen in our clinic, the patient reports doing very well.  She denies chest pain, palpitations, dyspnea, PND, orthopnea, nausea, vomiting, dizziness, syncope, edema, weight gain, or early satiety.  BP elevated on arrival. She took her medications around 0830 this am. Denies missed dosages. Pt denies symptoms of stroke including sudden severe headaches, blurred vision, unilateral weakness, facial droop, slurred speech, syncope or near syncope.  Reviewed these symptoms and pt knows to call 911 if any occur.  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 Adrenal adenoma 6/11   lt on ct 6/11  . GERD (gastroesophageal reflux disease)   . Hypercholesteremia   . Hypertension   . Kidney stone 6/11   rt. on ct 6/11  . Osteoporosis   . Paroxysmal atrial fibrillation ([place])    a. PVI 10/13 b. PVI 12/15   Past Surgical History:  Procedure Laterality Date  . ATRIAL FIBRILLATION ABLATION N/A 06/07/2012   PVI and CTI ablation by Dr Rayann Heman  . ATRIAL FIBRILLATION ABLATION N/A 07/31/2014   PVI by Dr Rayann Heman; initial ablation 2013  . CARDIOVERSION  05/06/2012   Procedure: CARDIOVERSION;  Surgeon: Sueanne Margarita, MD;  Location: [facility] ENDOSCOPY;  Service: Cardiovascular;  Laterality: N/A;  h/p in file drawer  . CARDIOVERSION N/A 10/06/2017   Procedure: CARDIOVERSION;  Surgeon: Skeet Latch, MD;  Location: [facility] ENDOSCOPY;  Service: Cardiovascular;  Laterality: N/A;  . CARDIOVERSION N/A 03/28/2018   Procedure: CARDIOVERSION;  Surgeon: Dorothy Spark, MD;  Location: [facility] ENDOSCOPY;  Service: Cardiovascular;  Laterality: N/A;  . CARDIOVERSION N/A 02/09/2019   Procedure: CARDIOVERSION;  Surgeon: Pixie Casino, MD;  Location: [place];  Service: Cardiovascular;  Laterality: N/A;  . CESAREAN SECTION  1978-1979   x-2  . CHOLECYSTECTOMY  1989  . ELECTROPHYSIOLOGIC STUDY N/A 09/08/2016   Procedure: Cardioversion;  Surgeon: Thompson Grayer, MD;  Location: [place] CV LAB;  Service: Cardiovascular;  Laterality: N/A;  . EP IMPLANTABLE DEVICE N/A 09/08/2016   Procedure: Loop Recorder Removal;  Surgeon: Thompson Grayer, MD;  Location: [place] CV LAB;  Service: Cardiovascular;  Laterality: N/A;  . EP IMPLANTABLE DEVICE N/A 09/08/2016   Procedure: Pacemaker Implant;  Surgeon: Thompson Grayer, MD;  Location: [place] CV LAB;  Service: Cardiovascular;  Laterality: N/A;  . Implantable loop recorder implantation  05/30/14   Medtronic Reveal LINQ implanted by Dr Rayann Heman with RIO II protocol (ambulatory setting)  . TEE WITHOUT CARDIOVERSION  06/06/2012   Procedure: TRANSESOPHAGEAL ECHOCARDIOGRAM (TEE);  Surgeon: Jettie Booze, MD;  Location: [place];  Service: Cardiovascular;  Laterality: N/A;  . TEE WITHOUT CARDIOVERSION N/A 07/30/2014   Procedure: TRANSESOPHAGEAL ECHOCARDIOGRAM (TEE);  Surgeon: Larey Dresser, MD;  Location: [place];  Service: Cardiovascular;  Laterality: N/A;  . TEE WITHOUT CARDIOVERSION N/A 10/06/2017   Procedure: TRANSESOPHAGEAL ECHOCARDIOGRAM (TEE);  Surgeon: Skeet Latch, MD;  Location: [place];  Service: Cardiovascular;  Laterality: N/A;  . TEE WITHOUT CARDIOVERSION N/A 03/28/2018   Procedure: TRANSESOPHAGEAL ECHOCARDIOGRAM (TEE);  Surgeon: Dorothy Spark, MD;  Location: [place];  Service: Cardiovascular;  Laterality: N/A;  .  TEE WITHOUT CARDIOVERSION N/A 09/30/2018   Procedure: TRANSESOPHAGEAL ECHOCARDIOGRAM (TEE);  Surgeon: Elouise Munroe, MD;  Location: [facility] ENDOSCOPY;  Service:  Cardiovascular;  Laterality: N/A;  . TEE WITHOUT CARDIOVERSION N/A 02/09/2019   Procedure: TRANSESOPHAGEAL ECHOCARDIOGRAM (TEE);  Surgeon: Pixie Casino, MD;  Location: [facility] ENDOSCOPY;  Service: Cardiovascular;  Laterality: N/A;    Current Outpatient Medications  Medication Sig Dispense Refill  . acetaminophen (TYLENOL) 500 MG tablet Take 250-500 mg by mouth daily as needed for moderate pain.     Marland Kitchen alum & mag hydroxide-simeth (MAALOX/MYLANTA) 200-200-20 MG/5ML suspension Take 15 mLs by mouth every 6 (six) hours as needed for indigestion or heartburn.    . calcium carbonate (OS-CAL) 600 MG TABS tablet Take 600 mg by mouth 2 (two) times daily with a meal.     . cholecalciferol (VITAMIN D) 1000 UNITS tablet Take 1,000 Units by mouth every evening.     Marland Kitchen losartan (COZAAR) 100 MG tablet Take 0.5 tablets (50 mg total) by mouth daily. (Patient taking differently: Take 50 mg by mouth every evening. ) 90 tablet 3  . metoprolol tartrate (LOPRESSOR) 50 MG tablet Take 50 mg by mouth 2 (two) times daily.    . pantoprazole (PROTONIX) 40 MG tablet Take 40 mg by mouth daily before breakfast.     . Polyethyl Glycol-Propyl Glycol (SYSTANE ULTRA PF OP) Place 1 drop into both eyes 2 (two) times a day.    . pravastatin (PRAVACHOL) 20 MG tablet Take 20 mg by mouth every evening.   1  . sotalol (BETAPACE) 80 MG tablet TAKE 1 TABLET (80 MG TOTAL) BY MOUTH 2 (TWO) TIMES DAILY. 180 tablet 2  . traZODone (DESYREL) 50 MG tablet Take 50 mg by mouth at bedtime.     Marland Kitchen warfarin (COUMADIN) 5 MG tablet Take 1/2 tablet daily or as directed by the Coumadin clinic (Patient taking differently: Take 2.5-5 mg by mouth See admin instructions. Take 2.5 mg daily in the evening except on Mondays take 5 mg in the evening) 50 tablet 1  . amLODipine (NORVASC) 2.5 MG tablet Take 1 tablet (2.5 mg total) by mouth daily. 30 tablet 3   No current facility-administered medications for this visit.     Allergies:   Morphine and related and  Codeine   Social History: Social History   Socioeconomic History  . Marital status: Widowed    Spouse name: Not on file  . Number of children: Not on file  . Years of education: Not on file  . Highest education level: Not on file  Occupational History  . Not on file  Social Needs  . Financial resource strain: Not on file  . Food insecurity    Worry: Not on file    Inability: Not on file  . Transportation needs    Medical: Not on file    Non-medical: Not on file  Tobacco Use  . Smoking status: Never Smoker  . Smokeless tobacco: Never Used  Substance and Sexual Activity  . Alcohol use: No  . Drug use: No  . Sexual activity: Not on file  Lifestyle  . Physical activity    Days per week: Not on file    Minutes per session: Not on file  . Stress: Not on file  Relationships  . Social Herbalist on phone: Not on file    Gets together: Not on file    Attends religious service: Not on file    Active member of club  or organization: Not on file    Attends meetings of clubs or organizations: Not on file    Relationship status: Not on file  . Intimate partner violence    Fear of current or ex partner: Not on file    Emotionally abused: Not on file    Physically abused: Not on file    Forced sexual activity: Not on file  Other Topics Concern  . 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   [place]   Marital Status:single,widowed,2006.    Family History: Family History  Problem Relation Age of Onset  . Heart attack Father   . CAD Father   . Hip fracture Mother        complication  . Lung cancer Brother   . Heart attack Brother        massive  . Other Brother        mva  . Thyroid cancer Sister   . Atrial fibrillation Sister   . Ovarian cancer Sister        surviver     Review of Systems: All other systems reviewed and are otherwise negative except as noted above.   Physical Exam: Vitals:   03/31/19 1027 03/31/19 1045  BP: (!) 210/92 (!) 180/88  Pulse: 63   Weight: 152 lb (68.9 kg)   Height: 5\' 3"  (1.6 m)      GEN- The patient is well appearing, alert and oriented x 3 today.   HEENT: normocephalic, atraumatic; sclera clear, conjunctiva pink; hearing intact; oropharynx clear; neck supple  Lungs- Clear to ausculation bilaterally, normal work of breathing.  No wheezes, rales, rhonchi Heart- Regular rate and rhythm, no murmurs, rubs or gallops  GI- soft, non-tender, non-distended, bowel sounds present  Extremities- no clubbing, cyanosis, or edema  MS- no significant deformity or atrophy Skin- warm and dry, no rash or lesion; PPM pocket well healed Psych- euthymic mood, full affect Neuro- strength and sensation are intact  PPM Interrogation- reviewed in detail today,  See PACEART report  EKG:  EKG is ordered today. The ekg ordered today shows A paced rhythm at 63 bpm, personally reviewed. QTc stable at 442 ms  Recent Labs: 09/28/2018: Magnesium 2.3 02/06/2019: BUN 16; Creatinine, Ser 0.99; Hemoglobin 12.0; Platelets 316; Potassium 4.5; Sodium 139   Wt Readings from Last 3 Encounters:  03/31/19 152 lb (68.9 kg)  02/24/19 152 lb (68.9 kg)  02/09/19 155 lb (70.3 kg)      Assessment and Plan:  1.  Symptomatic bradycardia s/p Medtronic PPM  Normal PPM function See Pace Art report No changes today  2. Persistent Atrial fibrillation AF burden by device interrogation 0.5%.  V rates controlled. Continue Sotalol. QTc stable today. BMET Mg today. Continue coumadin for CHA2DS2VASC of 3  3. HTN Urgency BP improved from 210 on arrival to 180 prior to discharge without intervention. Pt denies symptoms of stroke including sudden severe headaches, blurred vision, unilateral weakness, facial droop, slurred speech, syncope or near syncope.  Reviewed these symptoms and pt knows to call 911 if any occur. Will add back amlodipine 2.5 mg daily (Previously  stopped due to low BP when going up on BB). She will call Monday if BP remains > 150, else she will bring her BP log to her coumadin visit next week.  Her BP typically runs ~ 130-140 at home.    Current medicines are reviewed at length with the patient today.   The  patient does not have concerns regarding her medicines.  The following changes were made today: Add Amlodipine 2.5 mg daily.   Labs/ tests ordered today include:  Orders Placed This Encounter  Procedures  . Basic metabolic panel  . Magnesium  . EKG 12-Lead    Disposition:   Follow up with Dr. Radford Pax 6 months, and Dr. Rayann Heman in 1 year.   Jacalyn Lefevre, PA-C  03/31/2019 12:52 PM  [place] [phone] (office) [phone] (fax)

## 2019-03-31 NOTE — Patient Instructions (Addendum)
Medication Instructions:   START TAKING AMLODIPINE 2.5 MG MG ONCE A DAY AT BED TIME   If you need a refill on your cardiac medications before your next appointment, please call your pharmacy.   Lab work:  BMET AND MAG   If you have labs (blood work) drawn today and your tests are completely normal, you will receive your results only by: Marland Kitchen MyChart Message (if you have MyChart) OR . A paper copy in the mail If you have any lab test that is abnormal or we need to change your treatment, we will call you to review the results.  Testing/Procedures: NONE ORDERED  TODAY   Follow-Up: At [facility], you and your health needs are our priority.  As part of our continuing mission to provide you with exceptional heart care, we have created designated Provider Care Teams.  These Care Teams include your primary Cardiologist (physician) and Advanced Practice Providers (APPs -  Physician Assistants and Nurse Practitioners) who all work together to provide you with the care you need, when you need it. You will need a follow up appointment in 1 years.  Please call our office 2 months in advance to schedule this appointment.  You may see Thompson Grayer, MD or one of the following Advanced Practice Providers on your designated Care Team:   Chanetta Marshall, NP . Tommye Standard, PA-C . Donavan Burnet PA-C   Your physician wants you to follow-up in:  [place]   6 [place] will receive a reminder letter in the mail two months in advance. If you don't receive a letter, please call our office to schedule the follow-up appointment.     Any Other Special Instructions Will Be Listed Below (If Applicable).

## 2019-04-06 ENCOUNTER — Other Ambulatory Visit (HOSPITAL_COMMUNITY): Payer: Self-pay | Admitting: Nurse Practitioner

## 2019-04-06 ENCOUNTER — Other Ambulatory Visit: Payer: Self-pay

## 2019-04-06 ENCOUNTER — Ambulatory Visit (INDEPENDENT_AMBULATORY_CARE_PROVIDER_SITE_OTHER): Payer: Medicare Other | Admitting: Pharmacist

## 2019-04-06 DIAGNOSIS — I4891 Unspecified atrial fibrillation: Secondary | ICD-10-CM | POA: Diagnosis not present

## 2019-04-06 DIAGNOSIS — Z5181 Encounter for therapeutic drug level monitoring: Secondary | ICD-10-CM | POA: Diagnosis not present

## 2019-04-06 LAB — POCT INR: INR: 3.2 — AB (ref 2.0–3.0)

## 2019-04-06 NOTE — Patient Instructions (Signed)
Continue taking 1/2 tablet daily except 1 tablet on Mondays. Recheck INR in 6 weeks. Call coumadin clinic [phone] for any procedures or any changes in medication. Main office # [phone]

## 2019-04-13 ENCOUNTER — Telehealth: Payer: Self-pay

## 2019-04-13 NOTE — Telephone Encounter (Signed)
-----   Message from Sueanne Margarita, MD sent at 04/12/2019  6:03 PM EDT ----- Monica [name] brought in BP readings for the last week on Amlodipine 2.5mg  daily and Lopressor.  BP ranges from 120-157/71-89mmHg.  BP still too high on most readings.  Please have her go up to Amlodipine 5mg  daily and check BP daily for a week and call with results.

## 2019-04-13 NOTE — Telephone Encounter (Signed)
Advised Ms. Duanne Limerick of Dr. Landis Gandy recommendations. She is agreeable.

## 2019-04-21 ENCOUNTER — Telehealth: Payer: Self-pay | Admitting: Cardiology

## 2019-04-21 NOTE — Telephone Encounter (Signed)
New Message   Patient states that she was told to call and give her BP readings   9/3  Morning 154/89  Afternoon 138/80 9/2 Morning  140/89  Afternoon 138/82 9/1 Morning 127/81 Afternoon 126/77 8/31 Morning 139/85 Afternoon 123/80 8/30 Morning 130/82 Afternoon 141/86   Patient states that occasionally she has some dizziness but nothing to bad.

## 2019-04-25 NOTE — Telephone Encounter (Signed)
Notified the pt that per Dr Radford Pax, she agrees with Jonni Sanger Tillery's plan for her to continue as he advised, and to continue to monitor BP, and report any abnormal findings or symptoms. Pt verbalized understanding and agrees with this plan.

## 2019-04-25 NOTE — Telephone Encounter (Signed)
Agee with plan to continue to monitor BP

## 2019-04-25 NOTE — Telephone Encounter (Signed)
BP appears to have mostly improved on increased amlodipine. Can likely follow on this dose for now, but will forward to Dr. Radford Pax as an FYI/for further.

## 2019-05-18 ENCOUNTER — Other Ambulatory Visit: Payer: Self-pay

## 2019-05-18 ENCOUNTER — Ambulatory Visit (INDEPENDENT_AMBULATORY_CARE_PROVIDER_SITE_OTHER): Payer: Medicare Other | Admitting: *Deleted

## 2019-05-18 DIAGNOSIS — Z5181 Encounter for therapeutic drug level monitoring: Secondary | ICD-10-CM | POA: Diagnosis not present

## 2019-05-18 DIAGNOSIS — I4891 Unspecified atrial fibrillation: Secondary | ICD-10-CM

## 2019-05-18 LAB — POCT INR: INR: 2.9 (ref 2.0–3.0)

## 2019-05-18 NOTE — Patient Instructions (Signed)
Description   Continue taking 1/2 tablet daily except 1 tablet on Mondays. Recheck INR in 7 weeks. Call coumadin clinic [phone] for any procedures or any changes in medication. Main office # [phone]

## 2019-05-23 ENCOUNTER — Ambulatory Visit: Payer: Medicare Other | Admitting: Cardiology

## 2019-05-23 ENCOUNTER — Other Ambulatory Visit: Payer: Self-pay

## 2019-05-23 ENCOUNTER — Encounter: Payer: Self-pay | Admitting: Cardiology

## 2019-05-23 VITALS — BP 148/78 | HR 73 | Ht 63.0 in | Wt 154.8 lb

## 2019-05-23 DIAGNOSIS — I10 Essential (primary) hypertension: Secondary | ICD-10-CM

## 2019-05-23 DIAGNOSIS — E7800 Pure hypercholesterolemia, unspecified: Secondary | ICD-10-CM | POA: Diagnosis not present

## 2019-05-23 DIAGNOSIS — I4819 Other persistent atrial fibrillation: Secondary | ICD-10-CM | POA: Diagnosis not present

## 2019-05-23 DIAGNOSIS — I2510 Atherosclerotic heart disease of native coronary artery without angina pectoris: Secondary | ICD-10-CM | POA: Diagnosis not present

## 2019-05-23 NOTE — Patient Instructions (Addendum)
Medication Instructions:   If you need a refill on your cardiac medications before your next appointment, please call your pharmacy.   Lab work:  If you have labs (blood work) drawn today and your tests are completely normal, you will receive your results only by: . MyChart Message (if you have MyChart) OR . A paper copy in the mail If you have any lab test that is abnormal or we need to change your treatment, we will call you to review the results.  Testing/Procedures: None ordered today.  Follow-Up: At [facility], you and your health needs are our priority.  As part of our continuing mission to provide you with exceptional heart care, we have created designated Provider Care Teams.  These Care Teams include your primary Cardiologist (physician) and Advanced Practice Providers (APPs -  Physician Assistants and Nurse Practitioners) who all work together to provide you with the care you need, when you need it. You will need a follow up appointment in 12 months.  Please call our office 2 months in advance to schedule this appointment.  You may see Traci Turner, MD or one of the following Advanced Practice Providers on your designated Care Team:   Brittainy Simmons, PA-C Dayna Dunn, PA-C . Michele Lenze, PA-C   

## 2019-05-23 NOTE — Progress Notes (Signed)
Cardiology Office Note:    Date:  05/23/2019   ID:  Monica [name], DOB 03-Nov-1940, MRN [id number]  PCP:  Carol Ada, MD  Cardiologist:  Fransico Him, MD    Referring MD: Carol Ada, MD   Chief Complaint  Patient presents with  . Coronary Artery Disease  . Hypertension  . Hyperlipidemia  . Atrial Fibrillation    History of Present Illness:    Monica [name] is a 78 y.o. female with a hx of PAD s/p ablation x2 and PPM implant 08/2016. She was seen in the atrial fibrillation clinic 10/04/2018 and was in atrial fibrillation. On 09/30/2018 she had TEE guided cardioversion.  Unfortunately TEE showed a small primarily fixed left atrial thrombus on the anterior of the left atrial appendage and cardioversion was not performed.  Coumadin clinic was aware and increased her INR to 2.5-3.5 goal. Plan was to leave her in atrial fibrillation until the clot issue resolved.  Beta-blocker was increased to slow her down.  Cardiac CT was done to rule out CAD and showed calcium score of 34 which is 74 percentile for age and sex.  Upper normal aortic size 3.7 cm.  Complex mixed plaque in the ostial LAD involving D1 takeoff 50 to 75% stenosis.  Study was not suitable for FFR due to Misregistration artifact.  She underwent Lexican myoview showing no ischemia.    She underwent TEE/DCCV to NSR 01/2019.  She was seen back in afib clinic and was maintaining NSR.  Sotolol and Metoprolol were continued.    She is here today for followup and is doing well.  She denies any chest pain or pressure, SOB, DOE, PND, orthopnea, LE edema, dizziness, palpitations or syncope. She is compliant with her meds and is tolerating meds with no SE.    Past Medical History:  Diagnosis Date  . Adrenal adenoma 6/11   lt on ct 6/11  . GERD (gastroesophageal reflux disease)   . Hypercholesteremia   . Hypertension   . Kidney stone 6/11   rt. on ct 6/11  . Osteoporosis   . Paroxysmal atrial fibrillation ([place])    a. PVI  10/13 b. PVI 12/15    Past Surgical History:  Procedure Laterality Date  . ATRIAL FIBRILLATION ABLATION N/A 06/07/2012   PVI and CTI ablation by Dr Rayann Heman  . ATRIAL FIBRILLATION ABLATION N/A 07/31/2014   PVI by Dr Rayann Heman; initial ablation 2013  . CARDIOVERSION  05/06/2012   Procedure: CARDIOVERSION;  Surgeon: Sueanne Margarita, MD;  Location: [facility] ENDOSCOPY;  Service: Cardiovascular;  Laterality: N/A;  h/p in file drawer  . CARDIOVERSION N/A 10/06/2017   Procedure: CARDIOVERSION;  Surgeon: Skeet Latch, MD;  Location: [facility] ENDOSCOPY;  Service: Cardiovascular;  Laterality: N/A;  . CARDIOVERSION N/A 03/28/2018   Procedure: CARDIOVERSION;  Surgeon: Dorothy Spark, MD;  Location: [facility] ENDOSCOPY;  Service: Cardiovascular;  Laterality: N/A;  . CARDIOVERSION N/A 02/09/2019   Procedure: CARDIOVERSION;  Surgeon: Pixie Casino, MD;  Location: [place];  Service: Cardiovascular;  Laterality: N/A;  . CESAREAN SECTION  1978-1979   x-2  . CHOLECYSTECTOMY  1989  . ELECTROPHYSIOLOGIC STUDY N/A 09/08/2016   Procedure: Cardioversion;  Surgeon: Thompson Grayer, MD;  Location: [place] CV LAB;  Service: Cardiovascular;  Laterality: N/A;  . EP IMPLANTABLE DEVICE N/A 09/08/2016   Procedure: Loop Recorder Removal;  Surgeon: Thompson Grayer, MD;  Location: [place] CV LAB;  Service: Cardiovascular;  Laterality: N/A;  . EP IMPLANTABLE DEVICE N/A 09/08/2016   Procedure: Pacemaker  Implant;  Surgeon: Thompson Grayer, MD;  Location: [place] CV LAB;  Service: Cardiovascular;  Laterality: N/A;  . Implantable loop recorder implantation  05/30/14   Medtronic Reveal LINQ implanted by Dr Rayann Heman with RIO II protocol (ambulatory setting)  . TEE WITHOUT CARDIOVERSION  06/06/2012   Procedure: TRANSESOPHAGEAL ECHOCARDIOGRAM (TEE);  Surgeon: Jettie Booze, MD;  Location: [place];  Service: Cardiovascular;  Laterality: N/A;  . TEE WITHOUT CARDIOVERSION N/A 07/30/2014   Procedure: TRANSESOPHAGEAL ECHOCARDIOGRAM  (TEE);  Surgeon: Larey Dresser, MD;  Location: [place];  Service: Cardiovascular;  Laterality: N/A;  . TEE WITHOUT CARDIOVERSION N/A 10/06/2017   Procedure: TRANSESOPHAGEAL ECHOCARDIOGRAM (TEE);  Surgeon: Skeet Latch, MD;  Location: [place];  Service: Cardiovascular;  Laterality: N/A;  . TEE WITHOUT CARDIOVERSION N/A 03/28/2018   Procedure: TRANSESOPHAGEAL ECHOCARDIOGRAM (TEE);  Surgeon: Dorothy Spark, MD;  Location: [facility] ENDOSCOPY;  Service: Cardiovascular;  Laterality: N/A;  . TEE WITHOUT CARDIOVERSION N/A 09/30/2018   Procedure: TRANSESOPHAGEAL ECHOCARDIOGRAM (TEE);  Surgeon: Elouise Munroe, MD;  Location: [facility] ENDOSCOPY;  Service: Cardiovascular;  Laterality: N/A;  . TEE WITHOUT CARDIOVERSION N/A 02/09/2019   Procedure: TRANSESOPHAGEAL ECHOCARDIOGRAM (TEE);  Surgeon: Pixie Casino, MD;  Location: [facility] ENDOSCOPY;  Service: Cardiovascular;  Laterality: N/A;    Current Medications: Current Meds  Medication Sig  . acetaminophen (TYLENOL) 500 MG tablet Take 250-500 mg by mouth daily as needed for moderate pain.   Marland Kitchen alum & mag hydroxide-simeth (MAALOX/MYLANTA) 200-200-20 MG/5ML suspension Take 15 mLs by mouth every 6 (six) hours as needed for indigestion or heartburn.  Marland Kitchen amLODipine (NORVASC) 2.5 MG tablet Take 1 tablet (2.5 mg total) by mouth daily. (Patient taking differently: Take 5 mg by mouth daily. )  . calcium carbonate (OS-CAL) 600 MG TABS tablet Take 600 mg by mouth 2 (two) times daily with a meal.   . cholecalciferol (VITAMIN D) 1000 UNITS tablet Take 1,000 Units by mouth every evening.   Marland Kitchen losartan (COZAAR) 100 MG tablet Take 0.5 tablets (50 mg total) by mouth daily. (Patient taking differently: Take 50 mg by mouth every evening. )  . metoprolol tartrate (LOPRESSOR) 50 MG tablet Take 50 mg by mouth 2 (two) times daily.  . pantoprazole (PROTONIX) 40 MG tablet Take 40 mg by mouth daily before breakfast.   . Polyethyl Glycol-Propyl Glycol (SYSTANE ULTRA PF OP) Place 1  drop into both eyes 2 (two) times a day.  . pravastatin (PRAVACHOL) 20 MG tablet Take 20 mg by mouth every evening.   . sotalol (BETAPACE) 80 MG tablet TAKE 1 TABLET (80 MG TOTAL) BY MOUTH 2 (TWO) TIMES DAILY.  . traZODone (DESYREL) 50 MG tablet Take 50 mg by mouth at bedtime.   Marland Kitchen warfarin (COUMADIN) 5 MG tablet Take 1/2 tablet daily or as directed by the Coumadin clinic (Patient taking differently: Take 2.5-5 mg by mouth See admin instructions. Take 2.5 mg daily in the evening except on Mondays take 5 mg in the evening)     Allergies:   Morphine and related and Codeine   Social History   Socioeconomic History  . Marital status: Widowed    Spouse name: Not on file  . Number of children: Not on file  . Years of education: Not on file  . Highest education level: Not on file  Occupational History  . Not on file  Social Needs  . Financial resource strain: Not on file  . Food insecurity    Worry: Not on file    Inability: Not  on file  . Transportation needs    Medical: Not on file    Non-medical: Not on file  Tobacco Use  . Smoking status: Never Smoker  . Smokeless tobacco: Never Used  Substance and Sexual Activity  . Alcohol use: No  . Drug use: No  . Sexual activity: Not on file  Lifestyle  . Physical activity    Days per week: Not on file    Minutes per session: Not on file  . Stress: Not on file  Relationships  . Social Herbalist on phone: Not on file    Gets together: Not on file    Attends religious service: Not on file    Active member of club or organization: Not on file    Attends meetings of clubs or organizations: Not on file    Relationship status: Not on file  Other Topics Concern  . 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   [place]   Marital Status:single,widowed,2006.     Family History: The patient's family history includes Atrial fibrillation in her sister;  CAD in her father; Heart attack in her brother and father; Hip fracture in her mother; Lung cancer in her brother; Other in her brother; Ovarian cancer in her sister; Thyroid cancer in her sister.  ROS:   Please see the history of present illness.    ROS  All other systems reviewed and negative.   EKGs/Labs/Other Studies Reviewed:    The following studies were reviewed today:   EKG:  EKG is not ordered today.   Recent Labs: 02/06/2019: Hemoglobin 12.0; Platelets 316 03/31/2019: BUN 14; Creatinine, Ser 0.84; Magnesium 2.1; Potassium 4.3; Sodium 138   Recent Lipid Panel No results found for: CHOL, TRIG, HDL, CHOLHDL, VLDL, LDLCALC, LDLDIRECT  Physical Exam:    VS:  BP (!) 148/78   Pulse 73   Ht 5\' 3"  (1.6 m)   Wt 154 lb 12.8 oz (70.2 kg)   SpO2 99%   BMI 27.42 kg/m     Wt Readings from Last 3 Encounters:  05/23/19 154 lb 12.8 oz (70.2 kg)  03/31/19 152 lb (68.9 kg)  02/24/19 152 lb (68.9 kg)     GEN:  Well nourished, well developed in no acute distress HEENT: Normal NECK: No JVD; No carotid bruits LYMPHATICS: No lymphadenopathy CARDIAC: RRR, no murmurs, rubs, gallops RESPIRATORY:  Clear to auscultation without rales, wheezing or rhonchi  ABDOMEN: Soft, non-tender, non-distended MUSCULOSKELETAL:  No edema; No deformity  SKIN: Warm and dry NEUROLOGIC:  Alert and oriented x 3 PSYCHIATRIC:  Normal affect   ASSESSMENT:    1. Persistent atrial fibrillation ([place])   2. Coronary artery disease involving native coronary artery of native heart without angina pectoris   3. Hypertension, benign   4. Pure hypercholesterolemia    PLAN:    In order of problems listed above:  1.  Persistent atrial fibrillation -s/p recent TEE/DCCV after increasing INR goal for prior LAA thrombus that resolved by TEE 01/2019. -maintaining NSR -continue Sotolol 80mg  BID, Metoprolol and Warfarin -If she reverts back to afib again will need to consider afib ablation vs. Tikosyn  2.  ASCAD   -Coronary CTA showed complex mixed plaque in the ostial LAD involving D1 takeoff 50 to 75% stenosis.  Study was not suitable for FFR due to Misregistration artifact.  -Lexiscan myoview showed no ischemia -she has no anginal sx -no ASA due to warfarin -continue  BB and statin  3.  HTN -BP controlled -continue Lopressor 50mg  BID, Losartan 100mg  daily and amlodipine 2.5mg  daily.   4.  HLD -LDL goal < 70 -get copy of FLP and ALT from PCP -continue Pravastatin 20mg  daily   Medication Adjustments/Labs and Tests Ordered: Current medicines are reviewed at length with the patient today.  Concerns regarding medicines are outlined above.  No orders of the defined types were placed in this encounter.  No orders of the defined types were placed in this encounter.   Signed, Fransico Him, MD  05/23/2019 3:32 PM    [place]

## 2019-06-02 ENCOUNTER — Telehealth: Payer: Self-pay

## 2019-06-02 NOTE — Telephone Encounter (Signed)
The patient has been notified of the result and verbalized understanding.  All questions (if any) were answered.  Pt reports that no med changes have been made prior to lab work. Her pravastatin dose remains unchanged.    Wilma Flavin, RN 06/02/2019 9:36 AM

## 2019-06-05 ENCOUNTER — Telehealth: Payer: Self-pay

## 2019-06-05 ENCOUNTER — Other Ambulatory Visit: Payer: Self-pay | Admitting: Cardiology

## 2019-06-05 ENCOUNTER — Other Ambulatory Visit: Payer: Self-pay

## 2019-06-05 DIAGNOSIS — I2510 Atherosclerotic heart disease of native coronary artery without angina pectoris: Secondary | ICD-10-CM

## 2019-06-05 MED ORDER — PRAVASTATIN SODIUM 20 MG PO TABS: 20.0000 mg | ORAL_TABLET | Freq: Two times a day (BID) | ORAL | 3 refills | Status: DC

## 2019-06-05 NOTE — Telephone Encounter (Signed)
Pt made aware to increase Pravastatin to 40mg /day New Rx [facility] to [facility] [address] Lab appt made for f/u ALT & FLP on Nov 23 Pt verbalized understanding

## 2019-06-05 NOTE — Telephone Encounter (Signed)
Pt called to let me know Medtronic is sending her a new monitor. I gave her my direct office number to call to get assistance in sending a manual transmission with new monitor. Pt verbalized understanding.

## 2019-06-07 ENCOUNTER — Telehealth: Payer: Self-pay

## 2019-06-07 MED ORDER — PRAVASTATIN SODIUM 40 MG PO TABS: 40.0000 mg | ORAL_TABLET | Freq: Every day | ORAL | 3 refills | Status: DC

## 2019-06-07 NOTE — Telephone Encounter (Signed)
[facility] pharmacist called to clarify order for pravastatin. Pravastatin 20 mg BID is not a correct order and will not be covered by insurance.  Per 10/19 phone note, "Monica Flavin, RN  06/05/19 9:18 AM Note   Pt made aware to increase Pravastatin to 40mg /day [facility] [address] [facility] appt made for f/u ALT & FLP on Nov 23 Pt verbalized understanding      Reiterated order given to patient. The pharmacist corrected the order and will dispense pravastatin 40 mg daily #90 with 3 refills.

## 2019-06-13 ENCOUNTER — Other Ambulatory Visit: Payer: Self-pay | Admitting: Dermatology

## 2019-06-13 DIAGNOSIS — C4491 Basal cell carcinoma of skin, unspecified: Secondary | ICD-10-CM

## 2019-06-13 HISTORY — DX: Basal cell carcinoma of skin, unspecified: C44.91

## 2019-06-14 ENCOUNTER — Ambulatory Visit (INDEPENDENT_AMBULATORY_CARE_PROVIDER_SITE_OTHER): Payer: Medicare Other | Admitting: *Deleted

## 2019-06-14 DIAGNOSIS — I480 Paroxysmal atrial fibrillation: Secondary | ICD-10-CM | POA: Diagnosis not present

## 2019-06-14 DIAGNOSIS — R002 Palpitations: Secondary | ICD-10-CM

## 2019-06-14 LAB — CUP PACEART REMOTE DEVICE CHECK
Battery Remaining Longevity: 78 mo
Battery Voltage: 3.01 V
Brady Statistic AP VP Percent: 0.07 %
Brady Statistic AP VS Percent: 85.27 %
Brady Statistic AS VP Percent: 3.73 %
Brady Statistic AS VS Percent: 10.94 %
Brady Statistic RA Percent Paced: 80.5 %
Brady Statistic RV Percent Paced: 3.71 %
Date Time Interrogation Session: 20201028114443
Implantable Lead Implant Date: 20180123
Implantable Lead Implant Date: 20180123
Implantable Lead Location: 753859
Implantable Lead Location: 753860
Implantable Lead Model: 5076
Implantable Lead Model: 5076
Implantable Pulse Generator Implant Date: 20180123
Lead Channel Impedance Value: 323 Ohm
Lead Channel Impedance Value: 399 Ohm
Lead Channel Impedance Value: 513 Ohm
Lead Channel Impedance Value: 665 Ohm
Lead Channel Pacing Threshold Amplitude: 0.625 V
Lead Channel Pacing Threshold Amplitude: 0.875 V
Lead Channel Pacing Threshold Pulse Width: 0.4 ms
Lead Channel Pacing Threshold Pulse Width: 0.4 ms
Lead Channel Sensing Intrinsic Amplitude: 2.375 mV
Lead Channel Sensing Intrinsic Amplitude: 2.375 mV
Lead Channel Sensing Intrinsic Amplitude: 8 mV
Lead Channel Sensing Intrinsic Amplitude: 8 mV
Lead Channel Setting Pacing Amplitude: 2 V
Lead Channel Setting Pacing Amplitude: 2.5 V
Lead Channel Setting Pacing Pulse Width: 0.4 ms
Lead Channel Setting Sensing Sensitivity: 2 mV

## 2019-06-24 ENCOUNTER — Other Ambulatory Visit: Payer: Self-pay | Admitting: Student

## 2019-06-28 NOTE — Progress Notes (Signed)
Remote pacemaker transmission.   

## 2019-07-06 ENCOUNTER — Ambulatory Visit: Payer: Medicare Other | Admitting: *Deleted

## 2019-07-06 ENCOUNTER — Other Ambulatory Visit: Payer: Self-pay

## 2019-07-06 DIAGNOSIS — I4891 Unspecified atrial fibrillation: Secondary | ICD-10-CM | POA: Diagnosis not present

## 2019-07-06 DIAGNOSIS — Z5181 Encounter for therapeutic drug level monitoring: Secondary | ICD-10-CM

## 2019-07-06 LAB — POCT INR: INR: 3.4 — AB (ref 2.0–3.0)

## 2019-07-06 NOTE — Patient Instructions (Signed)
Description   Continue taking 1/2 tablet daily except 1 tablet on Mondays. Recheck INR in 8 weeks. Call coumadin clinic [phone] for any procedures or any changes in medication. Main office # [phone]

## 2019-07-10 ENCOUNTER — Other Ambulatory Visit: Payer: Medicare Other | Admitting: *Deleted

## 2019-07-10 ENCOUNTER — Other Ambulatory Visit: Payer: Self-pay

## 2019-07-10 DIAGNOSIS — I2510 Atherosclerotic heart disease of native coronary artery without angina pectoris: Secondary | ICD-10-CM

## 2019-07-17 LAB — LIPID PANEL
Chol/HDL Ratio: 2.6 ratio (ref 0.0–4.4)
Cholesterol, Total: 281 mg/dL — ABNORMAL HIGH (ref 100–199)
HDL: 107 mg/dL (ref >39)
LDL Chol Calc (NIH): 131 mg/dL — ABNORMAL HIGH (ref 0–99)
Triglycerides: 250 mg/dL — ABNORMAL HIGH (ref 0–149)
VLDL Cholesterol Cal: 43 mg/dL — ABNORMAL HIGH (ref 5–40)

## 2019-07-17 LAB — ALT: ALT: 18 IU/L (ref 0–32)

## 2019-07-18 ENCOUNTER — Telehealth: Payer: Self-pay

## 2019-07-18 DIAGNOSIS — E785 Hyperlipidemia, unspecified: Secondary | ICD-10-CM

## 2019-07-18 NOTE — Telephone Encounter (Signed)
Called patient about results. Per Pharm D, patient needs to see lipid clinic. Order placed for referral. Will send message to scheduling to help with appt.

## 2019-07-18 NOTE — Telephone Encounter (Signed)
-----   Message from Sueanne Margarita, MD sent at 07/18/2019  1:24 PM EST ----- Lipids still not at goal.  Please forward to lipid clinic for further recommendations.

## 2019-07-20 ENCOUNTER — Other Ambulatory Visit: Payer: Self-pay | Admitting: Dermatology

## 2019-07-27 ENCOUNTER — Ambulatory Visit (INDEPENDENT_AMBULATORY_CARE_PROVIDER_SITE_OTHER): Payer: Medicare Other | Admitting: Pharmacist

## 2019-07-27 ENCOUNTER — Other Ambulatory Visit: Payer: Self-pay

## 2019-07-27 ENCOUNTER — Encounter: Payer: Self-pay | Admitting: Pharmacist

## 2019-07-27 DIAGNOSIS — E785 Hyperlipidemia, unspecified: Secondary | ICD-10-CM | POA: Diagnosis not present

## 2019-07-27 MED ORDER — ROSUVASTATIN CALCIUM 20 MG PO TABS: 20.0000 mg | ORAL_TABLET | Freq: Every day | ORAL | 3 refills | Status: DC

## 2019-07-27 NOTE — Patient Instructions (Addendum)
It was nice to see you again!  Please STOP taking pravastatin and START taking rosuvastatin 20mg  daily.  Try incorporating walking into your daily routine. Start small with a 10 min walk a few days a week and increase to 30 min, 5 das a week.  Try to limit fried foods, butter and biscuits. Eat more fresh veggies.   Please call us at [phone] with any questions or concerns.

## 2019-07-27 NOTE — Progress Notes (Signed)
Patient ID: HAYES CARRUTHERS                 DOB: 1940/09/06                    MRN: [id number]     HPI: Monica [name] is a 78 y.o. female patient referred to lipid clinic by Dr. Radford Pax. PMH is significant for afib, left atrial thrombus, CAD w/ calcium score of 34, 86 percentile for age and sex, HTN and HLD. Coronary CTA showed complex mixed plaque in the ostial LAD involving D1 takeoff 50 to 75% stenosis. Her pravastatin was increased from 20mg  to 40mg  back in October.   Patient presents today to lipid clinic for initial appointment. She was very surprised that her lipid panel was worse than before her pravastatin dose increase. She states that she was in afib for 14 days and she did eat more biscuits. She states the labs were fasting. She does not get a lot of exercise when its too cold to work in the garden. She has never been on any other cholesterol medications.   Current Medications: pravastatin 40mg  daily Intolerances: none Risk Factors: CAD per CT LDL goal: <70  Diet: breakfast: honey nut cheerios w/ 1% milk  Lunch: left overs  Dinner: chicken-baked, stewed, occassionally fired w/ canola oil, beans, black eye peas Chick fil a once a week  Exercise: yard work, not much when its cold  Family History: The patient's family history includes Atrial fibrillation in her sister; CAD in her father; Heart attack in her brother and father; Hip fracture in her mother; Lung cancer in her brother; Other in her brother; Ovarian cancer in her sister; Thyroid cancer in her sister.  Social History: neg ETOH and tobacco  Labs:07/10/2019 TC 281 TG 250 HDL 43 LDL 131 (pravastatin 40mg  daily)           05/26/2019 TC 205 TG 119 HDL 76 LDL 106 (pravastatin 20mg  daily)  Past Medical History:  Diagnosis Date  . Adrenal adenoma 6/11   lt on ct 6/11  . GERD (gastroesophageal reflux disease)   . Hypercholesteremia   . Hypertension   . Kidney stone 6/11   rt. on ct 6/11  . Osteoporosis   . Paroxysmal  atrial fibrillation ([place])    a. PVI 10/13 b. PVI 12/15    Current Outpatient Medications on File Prior to Visit  Medication Sig Dispense Refill  . acetaminophen (TYLENOL) 500 MG tablet Take 250-500 mg by mouth daily as needed for moderate pain.     Marland Kitchen alum & mag hydroxide-simeth (MAALOX/MYLANTA) 200-200-20 MG/5ML suspension Take 15 mLs by mouth every 6 (six) hours as needed for indigestion or heartburn.    Marland Kitchen amLODipine (NORVASC) 2.5 MG tablet TAKE 1 TABLET BY MOUTH EVERY DAY 90 tablet 1  . calcium carbonate (OS-CAL) 600 MG TABS tablet Take 600 mg by mouth 2 (two) times daily with a meal.     . cholecalciferol (VITAMIN D) 1000 UNITS tablet Take 1,000 Units by mouth every evening.     Marland Kitchen losartan (COZAAR) 100 MG tablet Take 0.5 tablets (50 mg total) by mouth daily. (Patient taking differently: Take 50 mg by mouth every evening. ) 90 tablet 3  . metoprolol tartrate (LOPRESSOR) 50 MG tablet Take 50 mg by mouth 2 (two) times daily.    . pantoprazole (PROTONIX) 40 MG tablet Take 40 mg by mouth daily before breakfast.     . Polyethyl Glycol-Propyl Glycol (SYSTANE ULTRA  PF OP) Place 1 drop into both eyes 2 (two) times a day.    . pravastatin (PRAVACHOL) 40 MG tablet Take 1 tablet (40 mg total) by mouth daily. 90 tablet 3  . sotalol (BETAPACE) 80 MG tablet TAKE 1 TABLET (80 MG TOTAL) BY MOUTH 2 (TWO) TIMES DAILY. 180 tablet 2  . traZODone (DESYREL) 50 MG tablet Take 50 mg by mouth at bedtime.     Marland Kitchen warfarin (COUMADIN) 5 MG tablet Take 1/2 tablet daily or as directed by the Coumadin clinic (Patient taking differently: Take 2.5-5 mg by mouth See admin instructions. Take 2.5 mg daily in the evening except on Mondays take 5 mg in the evening) 50 tablet 1   No current facility-administered medications on file prior to visit.    Allergies  Allergen Reactions  . Morphine And Related Nausea And Vomiting  . Codeine Nausea And Vomiting    Assessment/Plan:  1. Hyperlipidemia - Patient LDL is above goal of  <70. Although her lipid labs do seem to be out of proportion to her minor diet changes, even if we saw the expected 6% LDL lowering from the doubling of pravastatin, her LDL would still not be at goal. Therefore, will transition her to a high intensity statin. Will start rosuvastatin 20mg  daily. Stop pravastatin. Recheck lipid panel in 8 weeks. Patient was educated on limiting fried foods, butter and biscuits and eating more fresh veggies.    Thank you,  Ramond Dial, Pharm.D, BCPS, CPP [place]  [id number] [place], [place] [address]  Phone: [phone]; Fax: [phone]

## 2019-08-07 ENCOUNTER — Other Ambulatory Visit: Payer: Self-pay | Admitting: Cardiology

## 2019-08-31 ENCOUNTER — Other Ambulatory Visit: Payer: Self-pay

## 2019-08-31 ENCOUNTER — Ambulatory Visit: Payer: Medicare Other | Admitting: *Deleted

## 2019-08-31 DIAGNOSIS — Z5181 Encounter for therapeutic drug level monitoring: Secondary | ICD-10-CM

## 2019-08-31 DIAGNOSIS — I4891 Unspecified atrial fibrillation: Secondary | ICD-10-CM | POA: Diagnosis not present

## 2019-08-31 LAB — POCT INR: INR: 4.5 — AB (ref 2.0–3.0)

## 2019-08-31 NOTE — Patient Instructions (Addendum)
Description   Hold today, then Continue taking 1/2 tablet daily except 1 tablet on Mondays. Recheck INR in 2-3 weeks. Call coumadin clinic [phone] for any procedures or any changes in medication. Main office # [phone]

## 2019-09-01 ENCOUNTER — Ambulatory Visit: Payer: Medicare Other | Attending: Internal Medicine

## 2019-09-01 DIAGNOSIS — Z23 Encounter for immunization: Secondary | ICD-10-CM | POA: Insufficient documentation

## 2019-09-01 NOTE — Progress Notes (Signed)
   Covid-19 Vaccination Clinic  Name:  Monica [name]    MRN: [id number] DOB: Feb 20, 1941  09/01/2019  [name] was observed post Covid-19 immunization for 15 minutes without incidence. She was provided with Vaccine Information Sheet and instruction to access the V-Safe system.   [name] was instructed to call 911 with any severe reactions post vaccine: Marland Kitchen Difficulty breathing  . Swelling of your face and throat  . A fast heartbeat  . A bad rash all over your body  . Dizziness and weakness    Immunizations Administered    Name Date Dose VIS Date Route   Pfizer COVID-19 Vaccine 09/01/2019 12:54 PM 0.3 mL 07/28/2019 Intramuscular   Manufacturer: [place]   Lot: [id number]   [place]: [id number]

## 2019-09-13 ENCOUNTER — Ambulatory Visit (INDEPENDENT_AMBULATORY_CARE_PROVIDER_SITE_OTHER): Payer: Medicare Other | Admitting: *Deleted

## 2019-09-13 ENCOUNTER — Telehealth: Payer: Self-pay | Admitting: Emergency Medicine

## 2019-09-13 DIAGNOSIS — R001 Bradycardia, unspecified: Secondary | ICD-10-CM

## 2019-09-13 LAB — CUP PACEART REMOTE DEVICE CHECK
Battery Remaining Longevity: 78 mo
Battery Voltage: 3 V
Brady Statistic AP VP Percent: 0.08 %
Brady Statistic AP VS Percent: 79.56 %
Brady Statistic AS VP Percent: 6.72 %
Brady Statistic AS VS Percent: 13.65 %
Brady Statistic RA Percent Paced: 72.01 %
Brady Statistic RV Percent Paced: 6.65 %
Date Time Interrogation Session: 20210127152118
Implantable Lead Implant Date: 20180123
Implantable Lead Implant Date: 20180123
Implantable Lead Location: 753859
Implantable Lead Location: 753860
Implantable Lead Model: 5076
Implantable Lead Model: 5076
Implantable Pulse Generator Implant Date: 20180123
Lead Channel Impedance Value: 323 Ohm
Lead Channel Impedance Value: 361 Ohm
Lead Channel Impedance Value: 475 Ohm
Lead Channel Impedance Value: 589 Ohm
Lead Channel Pacing Threshold Amplitude: 0.5 V
Lead Channel Pacing Threshold Amplitude: 0.75 V
Lead Channel Pacing Threshold Pulse Width: 0.4 ms
Lead Channel Pacing Threshold Pulse Width: 0.4 ms
Lead Channel Sensing Intrinsic Amplitude: 0.875 mV
Lead Channel Sensing Intrinsic Amplitude: 0.875 mV
Lead Channel Sensing Intrinsic Amplitude: 8.25 mV
Lead Channel Sensing Intrinsic Amplitude: 8.25 mV
Lead Channel Setting Pacing Amplitude: 2 V
Lead Channel Setting Pacing Amplitude: 2.5 V
Lead Channel Setting Pacing Pulse Width: 0.4 ms
Lead Channel Setting Sensing Sensitivity: 2 mV

## 2019-09-13 NOTE — Telephone Encounter (Signed)
Alert for AF burden of 18.1%. patient currently in AF with controlled v-rates. Patient reports she feels tired and has no CP , SOB or any other symptoms. + Coumadin. Patient has been followed by AF clinic in past. Will refer to AF for follow-up and patient to send Manual transmission in the morning.

## 2019-09-13 NOTE — Progress Notes (Signed)
PPM remote 

## 2019-09-14 ENCOUNTER — Other Ambulatory Visit: Payer: Self-pay

## 2019-09-14 ENCOUNTER — Ambulatory Visit (INDEPENDENT_AMBULATORY_CARE_PROVIDER_SITE_OTHER): Payer: Medicare Other

## 2019-09-14 ENCOUNTER — Encounter (HOSPITAL_COMMUNITY): Payer: Self-pay | Admitting: Nurse Practitioner

## 2019-09-14 ENCOUNTER — Ambulatory Visit (HOSPITAL_COMMUNITY)
Admission: RE | Admit: 2019-09-14 | Discharge: 2019-09-14 | Disposition: A | Discharge status: Home / Self Care | Payer: Medicare Other | Source: Ambulatory Visit | Attending: Nurse Practitioner | Admitting: Nurse Practitioner

## 2019-09-14 VITALS — BP 132/88 | HR 97 | Ht 63.0 in | Wt 152.0 lb

## 2019-09-14 DIAGNOSIS — Z7901 Long term (current) use of anticoagulants: Secondary | ICD-10-CM | POA: Diagnosis not present

## 2019-09-14 DIAGNOSIS — Z8249 Family history of ischemic heart disease and other diseases of the circulatory system: Secondary | ICD-10-CM | POA: Insufficient documentation

## 2019-09-14 DIAGNOSIS — Z8041 Family history of malignant neoplasm of ovary: Secondary | ICD-10-CM | POA: Diagnosis not present

## 2019-09-14 DIAGNOSIS — Z950 Presence of cardiac pacemaker: Secondary | ICD-10-CM | POA: Diagnosis not present

## 2019-09-14 DIAGNOSIS — Z808 Family history of malignant neoplasm of other organs or systems: Secondary | ICD-10-CM | POA: Diagnosis not present

## 2019-09-14 DIAGNOSIS — Z79899 Other long term (current) drug therapy: Secondary | ICD-10-CM | POA: Insufficient documentation

## 2019-09-14 DIAGNOSIS — K219 Gastro-esophageal reflux disease without esophagitis: Secondary | ICD-10-CM | POA: Diagnosis not present

## 2019-09-14 DIAGNOSIS — D6869 Other thrombophilia: Secondary | ICD-10-CM

## 2019-09-14 DIAGNOSIS — Z885 Allergy status to narcotic agent status: Secondary | ICD-10-CM | POA: Diagnosis not present

## 2019-09-14 DIAGNOSIS — I4819 Other persistent atrial fibrillation: Secondary | ICD-10-CM

## 2019-09-14 DIAGNOSIS — Z801 Family history of malignant neoplasm of trachea, bronchus and lung: Secondary | ICD-10-CM | POA: Diagnosis not present

## 2019-09-14 DIAGNOSIS — Z87442 Personal history of urinary calculi: Secondary | ICD-10-CM | POA: Diagnosis not present

## 2019-09-14 DIAGNOSIS — I10 Essential (primary) hypertension: Secondary | ICD-10-CM | POA: Diagnosis not present

## 2019-09-14 DIAGNOSIS — E7800 Pure hypercholesterolemia, unspecified: Secondary | ICD-10-CM | POA: Diagnosis not present

## 2019-09-14 DIAGNOSIS — I480 Paroxysmal atrial fibrillation: Secondary | ICD-10-CM | POA: Diagnosis not present

## 2019-09-14 DIAGNOSIS — Z5181 Encounter for therapeutic drug level monitoring: Secondary | ICD-10-CM | POA: Diagnosis not present

## 2019-09-14 DIAGNOSIS — I4891 Unspecified atrial fibrillation: Secondary | ICD-10-CM | POA: Diagnosis not present

## 2019-09-14 LAB — CUP PACEART REMOTE DEVICE CHECK
Battery Remaining Longevity: 78 mo
Battery Voltage: 3 V
Brady Statistic AP VP Percent: 0.1 %
Brady Statistic AP VS Percent: 0.01 %
Brady Statistic AS VP Percent: 42.2 %
Brady Statistic AS VS Percent: 57.69 %
Brady Statistic RA Percent Paced: 0.07 %
Brady Statistic RV Percent Paced: 40.73 %
Date Time Interrogation Session: 20210128101740
Implantable Lead Implant Date: 20180123
Implantable Lead Implant Date: 20180123
Implantable Lead Location: 753859
Implantable Lead Location: 753860
Implantable Lead Model: 5076
Implantable Lead Model: 5076
Implantable Pulse Generator Implant Date: 20180123
Lead Channel Impedance Value: 323 Ohm
Lead Channel Impedance Value: 361 Ohm
Lead Channel Impedance Value: 494 Ohm
Lead Channel Impedance Value: 608 Ohm
Lead Channel Pacing Threshold Amplitude: 0.5 V
Lead Channel Pacing Threshold Amplitude: 0.875 V
Lead Channel Pacing Threshold Pulse Width: 0.4 ms
Lead Channel Pacing Threshold Pulse Width: 0.4 ms
Lead Channel Sensing Intrinsic Amplitude: 0.875 mV
Lead Channel Sensing Intrinsic Amplitude: 0.875 mV
Lead Channel Sensing Intrinsic Amplitude: 7.875 mV
Lead Channel Sensing Intrinsic Amplitude: 7.875 mV
Lead Channel Setting Pacing Amplitude: 2 V
Lead Channel Setting Pacing Amplitude: 2.5 V
Lead Channel Setting Pacing Pulse Width: 0.4 ms
Lead Channel Setting Sensing Sensitivity: 2 mV

## 2019-09-14 LAB — POCT INR: INR: 3.7 — AB (ref 2.0–3.0)

## 2019-09-14 NOTE — Patient Instructions (Signed)
Description   Skip today's dosage of Coumadin, then start taking 1/2 tablet daily. Recheck INR in 3 weeks. Call coumadin clinic [phone] for any procedures or any changes in medication. Main office # [phone]

## 2019-09-14 NOTE — Progress Notes (Signed)
Primary Care Physician: Carol Ada, MD Referring Physician: Dr. Lisette Abu Monica [name] is a 79 y.o. 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 She had an atrial clot noted on TEE back in March when set up for cardioversion which was changed. She then went for cardiac CT which did not clot and because coivd precautions, DCCV was put on hold. She just recently had another TEE which did not show any evidence for atrial thrombus and she went on to have successful cardioversion.  F/u in afib clinic, 09/14/19. I have not seen pt since 02/24/19, as she has been staying in rhythm. She did have one episode that she went into afib x 12 days but spontaneously converted. This time she has been out of rhythm since last Saturday. Continues  on warfarin with a CHA2DS2VASc score of 4.  Today, she denies symptoms of  chest pain, shortness of breath, orthopnea, PND, lower extremity edema, dizziness, presyncope, syncope, or neurologic sequela. The patient is tolerating medications without difficulties and is otherwise without complaint today.   Past Medical History:  Diagnosis Date  . Adrenal adenoma 6/11   lt on ct 6/11  . GERD (gastroesophageal reflux disease)   . Hypercholesteremia   . Hypertension   . Kidney stone 6/11   rt. on ct 6/11  . Osteoporosis   . Paroxysmal atrial fibrillation ([place])    a. PVI 10/13 b. PVI 12/15   Past Surgical History:  Procedure Laterality Date  . ATRIAL FIBRILLATION ABLATION N/A 06/07/2012   PVI and CTI ablation by Dr Rayann Heman  . ATRIAL FIBRILLATION ABLATION N/A 07/31/2014   PVI by Dr Rayann Heman; initial ablation 2013  . CARDIOVERSION  05/06/2012   Procedure: CARDIOVERSION;  Surgeon: Sueanne Margarita, MD;  Location: [facility] ENDOSCOPY;  Service: Cardiovascular;  Laterality: N/A;  h/p in file drawer  . CARDIOVERSION N/A 10/06/2017   Procedure: CARDIOVERSION;  Surgeon: Skeet Latch, MD;  Location: [facility] ENDOSCOPY;  Service: Cardiovascular;   Laterality: N/A;  . CARDIOVERSION N/A 03/28/2018   Procedure: CARDIOVERSION;  Surgeon: Dorothy Spark, MD;  Location: [facility] ENDOSCOPY;  Service: Cardiovascular;  Laterality: N/A;  . CARDIOVERSION N/A 02/09/2019   Procedure: CARDIOVERSION;  Surgeon: Pixie Casino, MD;  Location: [place];  Service: Cardiovascular;  Laterality: N/A;  . CESAREAN SECTION  1978-1979   x-2  . CHOLECYSTECTOMY  1989  . ELECTROPHYSIOLOGIC STUDY N/A 09/08/2016   Procedure: Cardioversion;  Surgeon: Thompson Grayer, MD;  Location: [place] CV LAB;  Service: Cardiovascular;  Laterality: N/A;  . EP IMPLANTABLE DEVICE N/A 09/08/2016   Procedure: Loop Recorder Removal;  Surgeon: Thompson Grayer, MD;  Location: [place] CV LAB;  Service: Cardiovascular;  Laterality: N/A;  . EP IMPLANTABLE DEVICE N/A 09/08/2016   Procedure: Pacemaker Implant;  Surgeon: Thompson Grayer, MD;  Location: [place] CV LAB;  Service: Cardiovascular;  Laterality: N/A;  . Implantable loop recorder implantation  05/30/14   Medtronic Reveal LINQ implanted by Dr Rayann Heman with RIO II protocol (ambulatory setting)  . TEE WITHOUT CARDIOVERSION  06/06/2012   Procedure: TRANSESOPHAGEAL ECHOCARDIOGRAM (TEE);  Surgeon: Jettie Booze, MD;  Location: [place];  Service: Cardiovascular;  Laterality: N/A;  . TEE WITHOUT CARDIOVERSION N/A 07/30/2014   Procedure: TRANSESOPHAGEAL ECHOCARDIOGRAM (TEE);  Surgeon: Larey Dresser, MD;  Location: [place];  Service: Cardiovascular;  Laterality: N/A;  . TEE WITHOUT CARDIOVERSION N/A 10/06/2017   Procedure: TRANSESOPHAGEAL ECHOCARDIOGRAM (TEE);  Surgeon: Skeet Latch, MD;  Location: [facility]  ENDOSCOPY;  Service: Cardiovascular;  Laterality: N/A;  . TEE WITHOUT CARDIOVERSION N/A 03/28/2018   Procedure: TRANSESOPHAGEAL ECHOCARDIOGRAM (TEE);  Surgeon: Dorothy Spark, MD;  Location: [facility] ENDOSCOPY;  Service: Cardiovascular;  Laterality: N/A;  . TEE WITHOUT CARDIOVERSION N/A 09/30/2018   Procedure: TRANSESOPHAGEAL  ECHOCARDIOGRAM (TEE);  Surgeon: Elouise Munroe, MD;  Location: [facility] ENDOSCOPY;  Service: Cardiovascular;  Laterality: N/A;  . TEE WITHOUT CARDIOVERSION N/A 02/09/2019   Procedure: TRANSESOPHAGEAL ECHOCARDIOGRAM (TEE);  Surgeon: Pixie Casino, MD;  Location: [facility] ENDOSCOPY;  Service: Cardiovascular;  Laterality: N/A;    Current Outpatient Medications  Medication Sig Dispense Refill  . acetaminophen (TYLENOL) 500 MG tablet Take 250-500 mg by mouth daily as needed for moderate pain.     Marland Kitchen alum & mag hydroxide-simeth (MAALOX/MYLANTA) 200-200-20 MG/5ML suspension Take 15 mLs by mouth every 6 (six) hours as needed for indigestion or heartburn.    Marland Kitchen amLODipine (NORVASC) 2.5 MG tablet TAKE 1 TABLET BY MOUTH EVERY DAY 90 tablet 1  . calcium carbonate (OS-CAL) 600 MG TABS tablet Take 600 mg by mouth 2 (two) times daily with a meal.     . cholecalciferol (VITAMIN D) 1000 UNITS tablet Take 1,000 Units by mouth every evening.     Marland Kitchen losartan (COZAAR) 100 MG tablet Take 0.5 tablets (50 mg total) by mouth daily. (Patient taking differently: Take 50 mg by mouth every evening. ) 90 tablet 3  . metoprolol tartrate (LOPRESSOR) 50 MG tablet Take 75 mg by mouth 2 (two) times daily.    . pantoprazole (PROTONIX) 40 MG tablet Take 40 mg by mouth daily before breakfast.     . Polyethyl Glycol-Propyl Glycol (SYSTANE ULTRA PF OP) Place 1 drop into both eyes 2 (two) times a day.    . rosuvastatin (CRESTOR) 20 MG tablet Take 1 tablet (20 mg total) by mouth daily. 90 tablet 3  . sotalol (BETAPACE) 80 MG tablet TAKE 1 TABLET (80 MG TOTAL) BY MOUTH 2 (TWO) TIMES DAILY. 180 tablet 2  . traZODone (DESYREL) 50 MG tablet Take 50 mg by mouth at bedtime.     Marland Kitchen warfarin (COUMADIN) 5 MG tablet TAKE 1/2 TABLET DAILY OR AS DIRECTED BY THE COUMADIN CLINIC 50 tablet 1   No current facility-administered medications for this encounter.    Allergies  Allergen Reactions  . Morphine And Related Nausea And Vomiting  . Codeine Nausea And  Vomiting    Social History   Socioeconomic History  . Marital status: Widowed    Spouse name: Not on file  . Number of children: Not on file  . Years of education: Not on file  . Highest education level: Not on file  Occupational History  . Not on file  Tobacco Use  . Smoking status: Never Smoker  . Smokeless tobacco: Never Used  Substance and Sexual Activity  . Alcohol use: No  . Drug use: No  . Sexual activity: Not on file  Other Topics Concern  . 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   [place]   Marital Status:single,widowed,2006.   Social Determinants of Health   Financial Resource Strain:   . Difficulty of Paying Living Expenses: Not on file  Food Insecurity:   . Worried About Charity fundraiser in the Last Year: Not on file  . Ran Out of Food in the Last Year: Not on file  Transportation Needs:   . Lack of Transportation (Medical): Not on file  .  Lack of Transportation (Non-Medical): Not on file  Physical Activity:   . Days of Exercise per Week: Not on file  . Minutes of Exercise per Session: Not on file  Stress:   . Feeling of Stress : Not on file  Social Connections:   . Frequency of Communication with Friends and Family: Not on file  . Frequency of Social Gatherings with Friends and Family: Not on file  . Attends Religious Services: Not on file  . Active Member of Clubs or Organizations: Not on file  . Attends Archivist Meetings: Not on file  . Marital Status: Not on file  Intimate Partner Violence:   . Fear of Current or Ex-Partner: Not on file  . Emotionally Abused: Not on file  . Physically Abused: Not on file  . Sexually Abused: Not on file    Family History  Problem Relation Age of Onset  . Heart attack Father   . CAD Father   . Hip fracture Mother        complication  . Lung cancer Brother   . Heart attack Brother        massive  . Other  Brother        mva  . Thyroid cancer Sister   . Atrial fibrillation Sister   . Ovarian cancer Sister        surviver    ROS- All systems are reviewed and negative except as per the HPI above  Physical Exam: Vitals:   09/14/19 1459  BP: 132/88  Pulse: 97  SpO2: 98%  Weight: 68.9 kg  Height: 5\' 3"  (1.6 m)   Wt Readings from Last 3 Encounters:  09/14/19 68.9 kg  05/23/19 70.2 kg  03/31/19 68.9 kg    Labs: Lab Results  Component Value Date   NA 138 03/31/2019   K 4.3 03/31/2019   CL 100 03/31/2019   CO2 25 03/31/2019   GLUCOSE 77 03/31/2019   BUN 14 03/31/2019   CREATININE 0.84 03/31/2019   CALCIUM 10.1 03/31/2019   MG 2.1 03/31/2019   Lab Results  Component Value Date   INR 3.7 (A) 09/14/2019   Lab Results  Component Value Date   CHOL 281 (H) 07/10/2019   HDL 107 07/10/2019   LDLCALC 131 (H) 07/10/2019   TRIG 250 (H) 07/10/2019     GEN- The patient is well appearing, alert and oriented x 3 today.   Head- normocephalic, atraumatic Eyes-  Sclera clear, conjunctiva pink Ears- hearing intact Oropharynx- clear Neck- supple, no JVP Lymph- no cervical lymphadenopathy Lungs- Clear to ausculation bilaterally, normal work of breathing Heart- irregular rate and rhythm, no murmurs, rubs or gallops, PMI not laterally displaced GI- soft, NT, ND, + BS Extremities- no clubbing, cyanosis, or edema MS- no significant deformity or atrophy Skin- no rash or lesion Psych- euthymic mood, full affect Neuro- strength and sensation are intact  EKG- A fib at 97 bpm, qrs int 72 bpm, qtc 462 ms Epic records reviewed    Assessment and Plan: 1. Afib Successful cardioversion 6/25 Martin Majestic out of rhythm this past Saturday  Continue sotalol 80 mg bid  Will hold losartan and amlodipine so I can increase metoprolol to 50 mg to  1/12 bid  Continue warfarin for a chadsvasc score of at least 4 Pt would like medical approach first and give it a little time, hopefully she will return  to rhythm and avoid cardioversion   2. HTN Stable today   3. PPM Per device  clinic  F/u in afib clinic in one week    [place]. Hailee Hollick, [facility] [address] [place], [place] [address] [phone]

## 2019-09-14 NOTE — Telephone Encounter (Signed)
Called and spoke with patient.  She is aware of appt today @3  pm with Roderic Palau, NP.

## 2019-09-14 NOTE — Patient Instructions (Signed)
Increase metoprolol to 75mg  twice a day  Hold losartan and amlodipine for now

## 2019-09-21 ENCOUNTER — Ambulatory Visit: Payer: Medicare Other | Attending: Internal Medicine

## 2019-09-21 DIAGNOSIS — Z23 Encounter for immunization: Secondary | ICD-10-CM

## 2019-09-21 NOTE — Progress Notes (Signed)
   Covid-19 Vaccination Clinic  Name:  Monica [name]    MRN: [id number] DOB: 10-04-1940  09/21/2019  [name] was observed post Covid-19 immunization for 15 minutes without incidence. She was provided with Vaccine Information Sheet and instruction to access the V-Safe system.   [name] was instructed to call 911 with any severe reactions post vaccine: Marland Kitchen Difficulty breathing  . Swelling of your face and throat  . A fast heartbeat  . A bad rash all over your body  . Dizziness and weakness    Immunizations Administered    Name Date Dose VIS Date Route   Pfizer COVID-19 Vaccine 09/21/2019  2:23 PM 0.3 mL 07/28/2019 Intramuscular   Manufacturer: [place]   Lot: [id number]   [place]: [id number]

## 2019-09-22 ENCOUNTER — Other Ambulatory Visit: Payer: Self-pay

## 2019-09-22 ENCOUNTER — Ambulatory Visit (HOSPITAL_COMMUNITY)
Admission: RE | Admit: 2019-09-22 | Discharge: 2019-09-22 | Disposition: A | Discharge status: Home / Self Care | Payer: Medicare Other | Source: Ambulatory Visit | Attending: Nurse Practitioner | Admitting: Nurse Practitioner

## 2019-09-22 ENCOUNTER — Encounter (HOSPITAL_COMMUNITY): Payer: Self-pay | Admitting: Nurse Practitioner

## 2019-09-22 VITALS — BP 138/76 | HR 69 | Ht 63.0 in | Wt 151.6 lb

## 2019-09-22 DIAGNOSIS — D6869 Other thrombophilia: Secondary | ICD-10-CM | POA: Diagnosis not present

## 2019-09-22 DIAGNOSIS — K219 Gastro-esophageal reflux disease without esophagitis: Secondary | ICD-10-CM | POA: Diagnosis not present

## 2019-09-22 DIAGNOSIS — Z7901 Long term (current) use of anticoagulants: Secondary | ICD-10-CM | POA: Insufficient documentation

## 2019-09-22 DIAGNOSIS — Z950 Presence of cardiac pacemaker: Secondary | ICD-10-CM | POA: Diagnosis not present

## 2019-09-22 DIAGNOSIS — Z8249 Family history of ischemic heart disease and other diseases of the circulatory system: Secondary | ICD-10-CM | POA: Insufficient documentation

## 2019-09-22 DIAGNOSIS — I10 Essential (primary) hypertension: Secondary | ICD-10-CM | POA: Diagnosis not present

## 2019-09-22 DIAGNOSIS — Z885 Allergy status to narcotic agent status: Secondary | ICD-10-CM | POA: Insufficient documentation

## 2019-09-22 DIAGNOSIS — I480 Paroxysmal atrial fibrillation: Secondary | ICD-10-CM

## 2019-09-22 DIAGNOSIS — Z79899 Other long term (current) drug therapy: Secondary | ICD-10-CM | POA: Insufficient documentation

## 2019-09-22 MED ORDER — METOPROLOL TARTRATE 50 MG PO TABS: 75.0000 mg | ORAL_TABLET | Freq: Two times a day (BID) | ORAL | 6 refills | Status: DC

## 2019-09-22 NOTE — Progress Notes (Signed)
Primary Care Physician: Carol Ada, MD Referring Physician: Dr. Lisette Abu Monica [name] is a 79 y.o. 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 She had an atrial clot noted on TEE back in March when set up for cardioversion which was changed. She then went for cardiac CT which did not clot and because coivd precautions, DCCV was put on hold. She just recently had another TEE which did not show any evidence for atrial thrombus and she went on to have successful cardioversion.  F/u in afib clinic, 09/14/19. I have not seen pt since 02/24/19, as she has been staying in rhythm. She did have one episode that she went into afib x 12 days but spontaneously converted. This time she has been out of rhythm since last Saturday. Continues  on warfarin with a CHA2DS2VASc score of 4.  F/u in afib clinic, 09/22/19,  pt called a few days ago and reported that she had gone back into rhythm. Her  ekg showed a paced rhythm today. She feels improved. She has been holding losartan and amlodipine as she had a soft BP and I wanted to increase BB. Her BP appears to be stable with these 2 meds being on hold with increase of BB.  She  brings in BP readings form home and confirms this.   Today, she denies symptoms of  chest pain, shortness of breath, orthopnea, PND, lower extremity edema, dizziness, presyncope, syncope, or neurologic sequela. The patient is tolerating medications without difficulties and is otherwise without complaint today.   Past Medical History:  Diagnosis Date  . Adrenal adenoma 6/11   lt on ct 6/11  . GERD (gastroesophageal reflux disease)   . Hypercholesteremia   . Hypertension   . Kidney stone 6/11   rt. on ct 6/11  . Osteoporosis   . Paroxysmal atrial fibrillation ([place])    a. PVI 10/13 b. PVI 12/15   Past Surgical History:  Procedure Laterality Date  . ATRIAL FIBRILLATION ABLATION N/A 06/07/2012   PVI and CTI ablation by Dr Rayann Heman  . ATRIAL  FIBRILLATION ABLATION N/A 07/31/2014   PVI by Dr Rayann Heman; initial ablation 2013  . CARDIOVERSION  05/06/2012   Procedure: CARDIOVERSION;  Surgeon: Sueanne Margarita, MD;  Location: [facility] ENDOSCOPY;  Service: Cardiovascular;  Laterality: N/A;  h/p in file drawer  . CARDIOVERSION N/A 10/06/2017   Procedure: CARDIOVERSION;  Surgeon: Skeet Latch, MD;  Location: [facility] ENDOSCOPY;  Service: Cardiovascular;  Laterality: N/A;  . CARDIOVERSION N/A 03/28/2018   Procedure: CARDIOVERSION;  Surgeon: Dorothy Spark, MD;  Location: [facility] ENDOSCOPY;  Service: Cardiovascular;  Laterality: N/A;  . CARDIOVERSION N/A 02/09/2019   Procedure: CARDIOVERSION;  Surgeon: Pixie Casino, MD;  Location: [place];  Service: Cardiovascular;  Laterality: N/A;  . CESAREAN SECTION  1978-1979   x-2  . CHOLECYSTECTOMY  1989  . ELECTROPHYSIOLOGIC STUDY N/A 09/08/2016   Procedure: Cardioversion;  Surgeon: Thompson Grayer, MD;  Location: [place] CV LAB;  Service: Cardiovascular;  Laterality: N/A;  . EP IMPLANTABLE DEVICE N/A 09/08/2016   Procedure: Loop Recorder Removal;  Surgeon: Thompson Grayer, MD;  Location: [place] CV LAB;  Service: Cardiovascular;  Laterality: N/A;  . EP IMPLANTABLE DEVICE N/A 09/08/2016   Procedure: Pacemaker Implant;  Surgeon: Thompson Grayer, MD;  Location: [place] CV LAB;  Service: Cardiovascular;  Laterality: N/A;  . Implantable loop recorder implantation  05/30/14   Medtronic Reveal LINQ implanted by Dr Rayann Heman with RIO II  protocol (ambulatory setting)  . TEE WITHOUT CARDIOVERSION  06/06/2012   Procedure: TRANSESOPHAGEAL ECHOCARDIOGRAM (TEE);  Surgeon: Jettie Booze, MD;  Location: [place];  Service: Cardiovascular;  Laterality: N/A;  . TEE WITHOUT CARDIOVERSION N/A 07/30/2014   Procedure: TRANSESOPHAGEAL ECHOCARDIOGRAM (TEE);  Surgeon: Larey Dresser, MD;  Location: [place];  Service: Cardiovascular;  Laterality: N/A;  . TEE WITHOUT CARDIOVERSION N/A 10/06/2017   Procedure:  TRANSESOPHAGEAL ECHOCARDIOGRAM (TEE);  Surgeon: Skeet Latch, MD;  Location: [place];  Service: Cardiovascular;  Laterality: N/A;  . TEE WITHOUT CARDIOVERSION N/A 03/28/2018   Procedure: TRANSESOPHAGEAL ECHOCARDIOGRAM (TEE);  Surgeon: Dorothy Spark, MD;  Location: [facility] ENDOSCOPY;  Service: Cardiovascular;  Laterality: N/A;  . TEE WITHOUT CARDIOVERSION N/A 09/30/2018   Procedure: TRANSESOPHAGEAL ECHOCARDIOGRAM (TEE);  Surgeon: Elouise Munroe, MD;  Location: [facility] ENDOSCOPY;  Service: Cardiovascular;  Laterality: N/A;  . TEE WITHOUT CARDIOVERSION N/A 02/09/2019   Procedure: TRANSESOPHAGEAL ECHOCARDIOGRAM (TEE);  Surgeon: Pixie Casino, MD;  Location: [facility] ENDOSCOPY;  Service: Cardiovascular;  Laterality: N/A;    Current Outpatient Medications  Medication Sig Dispense Refill  . acetaminophen (TYLENOL) 500 MG tablet Take 250-500 mg by mouth daily as needed for moderate pain.     Marland Kitchen alum & mag hydroxide-simeth (MAALOX/MYLANTA) 200-200-20 MG/5ML suspension Take 15 mLs by mouth every 6 (six) hours as needed for indigestion or heartburn.    . calcium carbonate (OS-CAL) 600 MG TABS tablet Take 600 mg by mouth 2 (two) times daily with a meal.     . cholecalciferol (VITAMIN D) 1000 UNITS tablet Take 1,000 Units by mouth every evening.     . metoprolol tartrate (LOPRESSOR) 50 MG tablet Take 1.5 tablets (75 mg total) by mouth 2 (two) times daily. 90 tablet 6  . pantoprazole (PROTONIX) 40 MG tablet Take 40 mg by mouth daily before breakfast.     . Polyethyl Glycol-Propyl Glycol (SYSTANE ULTRA PF OP) Place 1 drop into both eyes 2 (two) times a day.    . rosuvastatin (CRESTOR) 20 MG tablet Take 1 tablet (20 mg total) by mouth daily. 90 tablet 3  . sotalol (BETAPACE) 80 MG tablet TAKE 1 TABLET (80 MG TOTAL) BY MOUTH 2 (TWO) TIMES DAILY. 180 tablet 2  . traZODone (DESYREL) 50 MG tablet Take 50 mg by mouth at bedtime.     Marland Kitchen warfarin (COUMADIN) 5 MG tablet TAKE 1/2 TABLET DAILY OR AS DIRECTED BY THE  COUMADIN CLINIC 50 tablet 1   No current facility-administered medications for this encounter.    Allergies  Allergen Reactions  . Morphine And Related Nausea And Vomiting  . Codeine Nausea And Vomiting    Social History   Socioeconomic History  . Marital status: Widowed    Spouse name: Not on file  . Number of children: Not on file  . Years of education: Not on file  . Highest education level: Not on file  Occupational History  . Not on file  Tobacco Use  . Smoking status: Never Smoker  . Smokeless tobacco: Never Used  Substance and Sexual Activity  . Alcohol use: No  . Drug use: No  . Sexual activity: Not on file  Other Topics Concern  . 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   [place]   Marital Status:single,widowed,2006.   Social Determinants of Health   Financial Resource Strain:   . Difficulty of Paying Living Expenses: Not on file  Food Insecurity:   .  Worried About Charity fundraiser in the Last Year: Not on file  . Ran Out of Food in the Last Year: Not on file  Transportation Needs:   . Lack of Transportation (Medical): Not on file  . Lack of Transportation (Non-Medical): Not on file  Physical Activity:   . Days of Exercise per Week: Not on file  . Minutes of Exercise per Session: Not on file  Stress:   . Feeling of Stress : Not on file  Social Connections:   . Frequency of Communication with Friends and Family: Not on file  . Frequency of Social Gatherings with Friends and Family: Not on file  . Attends Religious Services: Not on file  . Active Member of Clubs or Organizations: Not on file  . Attends Archivist Meetings: Not on file  . Marital Status: Not on file  Intimate Partner Violence:   . Fear of Current or Ex-Partner: Not on file  . Emotionally Abused: Not on file  . Physically Abused: Not on file  . Sexually Abused: Not on file    Family  History  Problem Relation Age of Onset  . Heart attack Father   . CAD Father   . Hip fracture Mother        complication  . Lung cancer Brother   . Heart attack Brother        massive  . Other Brother        mva  . Thyroid cancer Sister   . Atrial fibrillation Sister   . Ovarian cancer Sister        surviver    ROS- All systems are reviewed and negative except as per the HPI above  Physical Exam: Vitals:   09/22/19 1035  BP: 138/76  Pulse: 69  Weight: 68.8 kg  Height: 5\' 3"  (1.6 m)   Wt Readings from Last 3 Encounters:  09/22/19 68.8 kg  09/14/19 68.9 kg  05/23/19 70.2 kg    Labs: Lab Results  Component Value Date   NA 138 03/31/2019   K 4.3 03/31/2019   CL 100 03/31/2019   CO2 25 03/31/2019   GLUCOSE 77 03/31/2019   BUN 14 03/31/2019   CREATININE 0.84 03/31/2019   CALCIUM 10.1 03/31/2019   MG 2.1 03/31/2019   Lab Results  Component Value Date   INR 3.7 (A) 09/14/2019   Lab Results  Component Value Date   CHOL 281 (H) 07/10/2019   HDL 107 07/10/2019   LDLCALC 131 (H) 07/10/2019   TRIG 250 (H) 07/10/2019     GEN- The patient is well appearing, alert and oriented x 3 today.   Head- normocephalic, atraumatic Eyes-  Sclera clear, conjunctiva pink Ears- hearing intact Oropharynx- clear Neck- supple, no JVP Lymph- no cervical lymphadenopathy Lungs- Clear to ausculation bilaterally, normal work of breathing Heart- irregular rate and rhythm, no murmurs, rubs or gallops, PMI not laterally displaced GI- soft, NT, ND, + BS Extremities- no clubbing, cyanosis, or edema MS- no significant deformity or atrophy Skin- no rash or lesion Psych- euthymic mood, full affect Neuro- strength and sensation are intact  EKG- Atrial paced rhythm at 69 bpm, pr int 290 ms, qrs int 74 ms, qtc 435 ms  Epic records reviewed   Assessment and Plan: 1. Afib Martin Majestic out of rhythm this past Saturday  But with increased BB has returned to SR Continue sotalol 80 mg bid    Continue  metoprolol  50 mg at 75 mg bid Continue warfarin  for a chadsvasc score of at least 4  2. HTN Stable today  Will continue to hold hold losartan and amlodipine as her BP's are stable off these 2 drugs   3. PPM Per device clinic  F/u with Dr. Lovena Le and Dr. Radford Pax in August and October respectively afib clinic as needed  Geroge Baseman. Blair [place]ina, [facility] [address] [place], [place] [address] [phone]

## 2019-09-26 ENCOUNTER — Other Ambulatory Visit: Payer: Medicare Other

## 2019-09-26 ENCOUNTER — Other Ambulatory Visit: Payer: Self-pay

## 2019-09-26 DIAGNOSIS — E785 Hyperlipidemia, unspecified: Secondary | ICD-10-CM

## 2019-09-26 LAB — LIPID PANEL
Chol/HDL Ratio: 2.1 ratio (ref 0.0–4.4)
Cholesterol, Total: 122 mg/dL (ref 100–199)
HDL: 58 mg/dL (ref >39)
LDL Chol Calc (NIH): 46 mg/dL (ref 0–99)
Triglycerides: 93 mg/dL (ref 0–149)
VLDL Cholesterol Cal: 18 mg/dL (ref 5–40)

## 2019-09-26 LAB — HEPATIC FUNCTION PANEL
ALT: 22 IU/L (ref 0–32)
AST: 28 IU/L (ref 0–40)
Albumin: 3.9 g/dL (ref 3.7–4.7)
Alkaline Phosphatase: 80 IU/L (ref 39–117)
Bilirubin Total: 0.5 mg/dL (ref 0.0–1.2)
Bilirubin, Direct: 0.13 mg/dL (ref 0.00–0.40)
Total Protein: 6.3 g/dL (ref 6.0–8.5)

## 2019-10-06 ENCOUNTER — Ambulatory Visit: Payer: Medicare Other | Admitting: *Deleted

## 2019-10-06 ENCOUNTER — Other Ambulatory Visit: Payer: Self-pay

## 2019-10-06 DIAGNOSIS — Z5181 Encounter for therapeutic drug level monitoring: Secondary | ICD-10-CM | POA: Diagnosis not present

## 2019-10-06 DIAGNOSIS — I4891 Unspecified atrial fibrillation: Secondary | ICD-10-CM

## 2019-10-06 LAB — POCT INR: INR: 3.1 — AB (ref 2.0–3.0)

## 2019-10-06 NOTE — Patient Instructions (Signed)
Description   Continue taking 1/2 tablet daily. Recheck INR in 4 weeks. Call coumadin clinic [phone] for any procedures or any changes in medication. Main office # [phone]

## 2019-11-03 ENCOUNTER — Other Ambulatory Visit: Payer: Self-pay

## 2019-11-03 ENCOUNTER — Ambulatory Visit: Payer: Medicare Other | Admitting: *Deleted

## 2019-11-03 DIAGNOSIS — I4891 Unspecified atrial fibrillation: Secondary | ICD-10-CM | POA: Diagnosis not present

## 2019-11-03 DIAGNOSIS — Z5181 Encounter for therapeutic drug level monitoring: Secondary | ICD-10-CM | POA: Diagnosis not present

## 2019-11-03 LAB — POCT INR: INR: 3.1 — AB (ref 2.0–3.0)

## 2019-11-03 NOTE — Patient Instructions (Signed)
Description   Continue taking 1/2 tablet daily. Recheck INR in 5 weeks. Call coumadin clinic [phone] for any procedures or any changes in medication. Main office # [phone]

## 2019-11-22 ENCOUNTER — Other Ambulatory Visit: Payer: Self-pay

## 2019-11-22 ENCOUNTER — Encounter: Payer: Self-pay | Admitting: Dermatology

## 2019-11-22 ENCOUNTER — Ambulatory Visit: Payer: Medicare Other | Admitting: Dermatology

## 2019-11-22 DIAGNOSIS — D1801 Hemangioma of skin and subcutaneous tissue: Secondary | ICD-10-CM | POA: Diagnosis not present

## 2019-11-22 DIAGNOSIS — L821 Other seborrheic keratosis: Secondary | ICD-10-CM

## 2019-11-22 DIAGNOSIS — Z87828 Personal history of other (healed) physical injury and trauma: Secondary | ICD-10-CM

## 2019-11-22 DIAGNOSIS — D229 Melanocytic nevi, unspecified: Secondary | ICD-10-CM

## 2019-11-22 NOTE — Patient Instructions (Addendum)
Return visit for [name].  The sites of her superficial skin cancers are both clear with minimal residual pinkness and no scar.  I explained that there is still a chance of local recurrence particularly in the first year.  There is no new skin cancer.  On her left lower neck there are 2 dark spots the inner one is a deep purple hemangioma the outer 1 is 1 of many rough brown keratoses.  Although [place] labeled these as being ugly, I think they are quite beautiful since they are both benign and for now we will leave these.  Additionally there are small cartilaginous nodules on the right helix; these are more likely weathering nodules than chondrodermatitis, but historically there has been some pain which is improved.  Monica [name] knows that if the pain recurs or if there is any crusting or bleeding, she should return for possible biopsy.  If all goes well, I will recheck [name] in 1 year.

## 2019-11-26 ENCOUNTER — Encounter: Payer: Self-pay | Admitting: Dermatology

## 2019-11-26 NOTE — Progress Notes (Signed)
   Follow-Up Visit   Subjective  Monica [name] is a 79 y.o. female who presents for the following: Follow-up (Patient here today for follow up on previous treatment of BCC on right cheekbone and above left brow.  No new concerns.).  Skin check, lesions left neck Location:  Duration:  Quality:  Associated Signs/Symptoms: Modifying Factors:  Severity:  Timing: Context:   The following portions of the chart were reviewed this encounter and updated as appropriate:     Objective  Well appearing patient in no apparent distress; mood and affect are within normal limits.  Head, neck, chest, back, arms, legs examined. Return visit for [name].  The sites of her superficial skin cancers are both clear with minimal residual pinkness and no scar.  I explained that there is still a chance of local recurrence particularly in the first year.  There is no new skin cancer.  On her left lower neck there are 2 dark spots the inner one is a deep purple hemangioma the outer 1 is 1 of many rough brown keratoses.  Although [name] labeled these as being ugly, I think they are quite beautiful since they are both benign and for now we will leave these.  Additionally there are small cartilaginous nodules on the right helix; these are more likely weathering nodules than chondrodermatitis, but historically there has been some pain which is improved.  Monica [name] knows that if the pain recurs or if there is any crusting or bleeding, she should return for possible biopsy.  If all goes well, I will recheck [name] in 1 year.

## 2019-12-06 ENCOUNTER — Other Ambulatory Visit: Payer: Self-pay | Admitting: Cardiology

## 2019-12-08 ENCOUNTER — Ambulatory Visit: Payer: Medicare Other | Admitting: *Deleted

## 2019-12-08 ENCOUNTER — Other Ambulatory Visit: Payer: Self-pay

## 2019-12-08 DIAGNOSIS — Z5181 Encounter for therapeutic drug level monitoring: Secondary | ICD-10-CM

## 2019-12-08 DIAGNOSIS — I4891 Unspecified atrial fibrillation: Secondary | ICD-10-CM | POA: Diagnosis not present

## 2019-12-08 LAB — POCT INR: INR: 3.4 — AB (ref 2.0–3.0)

## 2019-12-08 MED ORDER — SOTALOL HCL 80 MG PO TABS: 80.0000 mg | ORAL_TABLET | Freq: Two times a day (BID) | ORAL | 1 refills | Status: DC

## 2019-12-08 NOTE — Telephone Encounter (Signed)
Pt's medication was sent to pt's pharmacy as requested. Confirmation received.  °

## 2019-12-08 NOTE — Patient Instructions (Signed)
Description   Continue taking 1/2 tablet daily. Recheck INR in 6 weeks. Call coumadin clinic #[phone] for any procedures or any changes in medication. Main office # [phone]     

## 2019-12-13 ENCOUNTER — Ambulatory Visit (HOSPITAL_COMMUNITY): Payer: Medicare Other | Admitting: Nurse Practitioner

## 2019-12-13 ENCOUNTER — Ambulatory Visit (INDEPENDENT_AMBULATORY_CARE_PROVIDER_SITE_OTHER): Payer: Medicare Other | Admitting: *Deleted

## 2019-12-13 DIAGNOSIS — R001 Bradycardia, unspecified: Secondary | ICD-10-CM

## 2019-12-14 LAB — CUP PACEART REMOTE DEVICE CHECK
Battery Remaining Longevity: 64 mo
Battery Voltage: 3 V
Brady Statistic AP VP Percent: 0.11 %
Brady Statistic AP VS Percent: 71.91 %
Brady Statistic AS VP Percent: 10.44 %
Brady Statistic AS VS Percent: 17.54 %
Brady Statistic RA Percent Paced: 62.58 %
Brady Statistic RV Percent Paced: 10.22 %
Date Time Interrogation Session: 20210428122424
Implantable Lead Implant Date: 20180123
Implantable Lead Implant Date: 20180123
Implantable Lead Location: 753859
Implantable Lead Location: 753860
Implantable Lead Model: 5076
Implantable Lead Model: 5076
Implantable Pulse Generator Implant Date: 20180123
Lead Channel Impedance Value: 285 Ohm
Lead Channel Impedance Value: 342 Ohm
Lead Channel Impedance Value: 494 Ohm
Lead Channel Impedance Value: 608 Ohm
Lead Channel Pacing Threshold Amplitude: 0.5 V
Lead Channel Pacing Threshold Amplitude: 0.625 V
Lead Channel Pacing Threshold Pulse Width: 0.4 ms
Lead Channel Pacing Threshold Pulse Width: 0.4 ms
Lead Channel Sensing Intrinsic Amplitude: 0.75 mV
Lead Channel Sensing Intrinsic Amplitude: 0.75 mV
Lead Channel Sensing Intrinsic Amplitude: 6.625 mV
Lead Channel Sensing Intrinsic Amplitude: 6.625 mV
Lead Channel Setting Pacing Amplitude: 2 V
Lead Channel Setting Pacing Amplitude: 2.5 V
Lead Channel Setting Pacing Pulse Width: 0.4 ms
Lead Channel Setting Sensing Sensitivity: 2 mV

## 2019-12-14 NOTE — Progress Notes (Signed)
PPM Remote  

## 2020-01-19 ENCOUNTER — Other Ambulatory Visit: Payer: Self-pay

## 2020-01-19 ENCOUNTER — Ambulatory Visit: Payer: Medicare Other | Admitting: *Deleted

## 2020-01-19 DIAGNOSIS — Z5181 Encounter for therapeutic drug level monitoring: Secondary | ICD-10-CM

## 2020-01-19 DIAGNOSIS — I4891 Unspecified atrial fibrillation: Secondary | ICD-10-CM

## 2020-01-19 LAB — POCT INR: INR: 3.5 — AB (ref 2.0–3.0)

## 2020-01-19 MED ORDER — WARFARIN SODIUM 5 MG PO TABS: ORAL_TABLET | ORAL | 1 refills | Status: DC

## 2020-01-19 NOTE — Patient Instructions (Signed)
Description   Continue taking 1/2 tablet daily. Recheck INR in 6 weeks. Call coumadin clinic [phone] for any procedures or any changes in medication. Main office # [phone]

## 2020-02-24 ENCOUNTER — Other Ambulatory Visit (HOSPITAL_COMMUNITY): Payer: Self-pay | Admitting: Nurse Practitioner

## 2020-03-01 ENCOUNTER — Ambulatory Visit: Payer: Medicare Other

## 2020-03-01 ENCOUNTER — Other Ambulatory Visit: Payer: Self-pay

## 2020-03-01 DIAGNOSIS — I4891 Unspecified atrial fibrillation: Secondary | ICD-10-CM | POA: Diagnosis not present

## 2020-03-01 DIAGNOSIS — Z5181 Encounter for therapeutic drug level monitoring: Secondary | ICD-10-CM | POA: Diagnosis not present

## 2020-03-01 LAB — POCT INR: INR: 3.2 — AB (ref 2.0–3.0)

## 2020-03-01 NOTE — Patient Instructions (Signed)
Continue taking 1/2 tablet daily. Recheck INR in 8 weeks. Call coumadin clinic [phone] for any procedures or any changes in medication. Main office # [phone]

## 2020-03-13 ENCOUNTER — Ambulatory Visit (INDEPENDENT_AMBULATORY_CARE_PROVIDER_SITE_OTHER): Payer: Medicare Other | Admitting: *Deleted

## 2020-03-13 DIAGNOSIS — I480 Paroxysmal atrial fibrillation: Secondary | ICD-10-CM

## 2020-03-13 LAB — CUP PACEART REMOTE DEVICE CHECK
Battery Remaining Longevity: 72 mo
Battery Voltage: 3 V
Brady Statistic AP VP Percent: 0.12 %
Brady Statistic AP VS Percent: 74.95 %
Brady Statistic AS VP Percent: 10.83 %
Brady Statistic AS VS Percent: 14.09 %
Brady Statistic RA Percent Paced: 66.51 %
Brady Statistic RV Percent Paced: 10.66 %
Date Time Interrogation Session: 20210728162454
Implantable Lead Implant Date: 20180123
Implantable Lead Implant Date: 20180123
Implantable Lead Location: 753859
Implantable Lead Location: 753860
Implantable Lead Model: 5076
Implantable Lead Model: 5076
Implantable Pulse Generator Implant Date: 20180123
Lead Channel Impedance Value: 304 Ohm
Lead Channel Impedance Value: 342 Ohm
Lead Channel Impedance Value: 418 Ohm
Lead Channel Impedance Value: 532 Ohm
Lead Channel Pacing Threshold Amplitude: 0.5 V
Lead Channel Pacing Threshold Amplitude: 0.625 V
Lead Channel Pacing Threshold Pulse Width: 0.4 ms
Lead Channel Pacing Threshold Pulse Width: 0.4 ms
Lead Channel Sensing Intrinsic Amplitude: 0.625 mV
Lead Channel Sensing Intrinsic Amplitude: 0.625 mV
Lead Channel Sensing Intrinsic Amplitude: 6.25 mV
Lead Channel Sensing Intrinsic Amplitude: 6.25 mV
Lead Channel Setting Pacing Amplitude: 2 V
Lead Channel Setting Pacing Amplitude: 2.5 V
Lead Channel Setting Pacing Pulse Width: 0.4 ms
Lead Channel Setting Sensing Sensitivity: 2 mV

## 2020-03-15 NOTE — Progress Notes (Signed)
Remote pacemaker transmission.   

## 2020-04-08 ENCOUNTER — Telehealth: Payer: Self-pay | Admitting: *Deleted

## 2020-04-08 ENCOUNTER — Telehealth (INDEPENDENT_AMBULATORY_CARE_PROVIDER_SITE_OTHER): Payer: Medicare Other | Admitting: Internal Medicine

## 2020-04-08 ENCOUNTER — Encounter: Payer: Medicare Other | Admitting: Internal Medicine

## 2020-04-08 ENCOUNTER — Other Ambulatory Visit: Payer: Self-pay

## 2020-04-08 DIAGNOSIS — I10 Essential (primary) hypertension: Secondary | ICD-10-CM

## 2020-04-08 DIAGNOSIS — I480 Paroxysmal atrial fibrillation: Secondary | ICD-10-CM

## 2020-04-08 DIAGNOSIS — R001 Bradycardia, unspecified: Secondary | ICD-10-CM

## 2020-04-08 DIAGNOSIS — D6869 Other thrombophilia: Secondary | ICD-10-CM | POA: Diagnosis not present

## 2020-04-08 MED ORDER — RIVAROXABAN 20 MG PO TABS: 20.0000 mg | ORAL_TABLET | Freq: Every day | ORAL | 11 refills | Status: DC

## 2020-04-08 NOTE — Telephone Encounter (Signed)
Echo ordered.    Medication changes made.

## 2020-04-08 NOTE — Progress Notes (Signed)
Electrophysiology TeleHealth Note  Due to national recommendations of social distancing due to [place] 19, an audio telehealth visit is felt to be most appropriate for this patient at this time.  Verbal consent was obtained by me for the telehealth visit today.  The patient does not have capability for a virtual visit.  A phone visit is therefore required today.   Date:  04/08/2020   ID:  Monica [name], DOB February 19, 1941, MRN [id number]  Location: patient's home  Provider location:  [place]  Evaluation Performed: Follow-up visit  PCP:  Carol Ada, MD   Electrophysiologist:  Dr Rayann Heman  Chief Complaint:  palpitations  History of Present Illness:    Monica [name] is a 79 y.o. female who presents via telehealth conferencing today.  Since last being seen in our clinic, the patient reports doing reasonably well.  She continues to have increasing frequency of afib.  + palpitations and fatigue during afib.  Today, she denies symptoms of chest pain, shortness of breath,  lower extremity edema, dizziness, presyncope, or syncope.  The patient is otherwise without complaint today.     Past Medical History:  Diagnosis Date  . Adrenal adenoma 6/11   lt on ct 6/11  . Atrial fibrillation ([place]) 01/21/2011  . Atrial flutter ([place]) 05/16/2012  . BCC (basal cell carcinoma of skin) 05/22/2013   Right Upper Lip (curet, cautery, and excision)  . BCC (basal cell carcinoma of skin) 08/24/2013   Right Upper Lip(Sclerosis + margin) (MOH's)  . Bradycardia 01/21/2011  . Coronary artery calcification seen on CT scan 11/01/2018  . GERD (gastroesophageal reflux disease)   . Hypercholesteremia   . Hypertension   . Kidney stone 6/11   rt. on ct 6/11  . 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 Osteoporosis   . Palpitations 05/30/2014  . Paroxysmal atrial fibrillation ([place])    a. PVI 10/13 b. PVI 12/15  . Superficial nodular basal cell carcinoma  (BCC) 06/13/2019   Above Left Brow (curet and 5FU)  . Superficial nodular basal cell carcinoma (BCC) 06/13/2019   Right Cheekbone (curet and 5FU)    Past Surgical History:  Procedure Laterality Date  . ATRIAL FIBRILLATION ABLATION N/A 06/07/2012   PVI and CTI ablation by Dr Rayann Heman  . ATRIAL FIBRILLATION ABLATION N/A 07/31/2014   PVI by Dr Rayann Heman; initial ablation 2013  . CARDIOVERSION  05/06/2012   Procedure: CARDIOVERSION;  Surgeon: Sueanne Margarita, MD;  Location: [facility] ENDOSCOPY;  Service: Cardiovascular;  Laterality: N/A;  h/p in file drawer  . CARDIOVERSION N/A 10/06/2017   Procedure: CARDIOVERSION;  Surgeon: Skeet Latch, MD;  Location: [facility] ENDOSCOPY;  Service: Cardiovascular;  Laterality: N/A;  . CARDIOVERSION N/A 03/28/2018   Procedure: CARDIOVERSION;  Surgeon: Dorothy Spark, MD;  Location: [facility] ENDOSCOPY;  Service: Cardiovascular;  Laterality: N/A;  . CARDIOVERSION N/A 02/09/2019   Procedure: CARDIOVERSION;  Surgeon: Pixie Casino, MD;  Location: [place];  Service: Cardiovascular;  Laterality: N/A;  . CESAREAN SECTION  1978-1979   x-2  . CHOLECYSTECTOMY  1989  . ELECTROPHYSIOLOGIC STUDY N/A 09/08/2016   Procedure: Cardioversion;  Surgeon: Thompson Grayer, MD;  Location: [place] CV LAB;  Service: Cardiovascular;  Laterality: N/A;  . EP IMPLANTABLE DEVICE N/A 09/08/2016   Procedure: Loop Recorder Removal;  Surgeon: Thompson Grayer, MD;  Location: [place] CV LAB;  Service: Cardiovascular;  Laterality: N/A;  . EP IMPLANTABLE DEVICE N/A 09/08/2016   Procedure:  Pacemaker Implant;  Surgeon: Thompson Grayer, MD;  Location: [place] CV LAB;  Service: Cardiovascular;  Laterality: N/A;  . Implantable loop recorder implantation  05/30/14   Medtronic Reveal LINQ implanted by Dr Rayann Heman with RIO II protocol (ambulatory setting)  . TEE WITHOUT CARDIOVERSION  06/06/2012   Procedure: TRANSESOPHAGEAL ECHOCARDIOGRAM (TEE);  Surgeon: Jettie Booze, MD;  Location: [place];   Service: Cardiovascular;  Laterality: N/A;  . TEE WITHOUT CARDIOVERSION N/A 07/30/2014   Procedure: TRANSESOPHAGEAL ECHOCARDIOGRAM (TEE);  Surgeon: Larey Dresser, MD;  Location: [place];  Service: Cardiovascular;  Laterality: N/A;  . TEE WITHOUT CARDIOVERSION N/A 10/06/2017   Procedure: TRANSESOPHAGEAL ECHOCARDIOGRAM (TEE);  Surgeon: Skeet Latch, MD;  Location: [place];  Service: Cardiovascular;  Laterality: N/A;  . TEE WITHOUT CARDIOVERSION N/A 03/28/2018   Procedure: TRANSESOPHAGEAL ECHOCARDIOGRAM (TEE);  Surgeon: Dorothy Spark, MD;  Location: [facility] ENDOSCOPY;  Service: Cardiovascular;  Laterality: N/A;  . TEE WITHOUT CARDIOVERSION N/A 09/30/2018   Procedure: TRANSESOPHAGEAL ECHOCARDIOGRAM (TEE);  Surgeon: Elouise Munroe, MD;  Location: [facility] ENDOSCOPY;  Service: Cardiovascular;  Laterality: N/A;  . TEE WITHOUT CARDIOVERSION N/A 02/09/2019   Procedure: TRANSESOPHAGEAL ECHOCARDIOGRAM (TEE);  Surgeon: Pixie Casino, MD;  Location: [facility] ENDOSCOPY;  Service: Cardiovascular;  Laterality: N/A;    Current Outpatient Medications  Medication Sig Dispense Refill  . acetaminophen (TYLENOL) 500 MG tablet Take 250-500 mg by mouth daily as needed for moderate pain.     Marland Kitchen alum & mag hydroxide-simeth (MAALOX/MYLANTA) 200-200-20 MG/5ML suspension Take 15 mLs by mouth every 6 (six) hours as needed for indigestion or heartburn.    . calcium carbonate (OS-CAL) 600 MG TABS tablet Take 600 mg by mouth 2 (two) times daily with a meal.     . cholecalciferol (VITAMIN D) 1000 UNITS tablet Take 1,000 Units by mouth every evening.     . metoprolol tartrate (LOPRESSOR) 50 MG tablet TAKE 1 AND 1/2 TABLETS BY MOUTH TWICE DAILY 135 tablet 1  . pantoprazole (PROTONIX) 40 MG tablet Take 40 mg by mouth daily before breakfast.     . Polyethyl Glycol-Propyl Glycol (SYSTANE ULTRA PF OP) Place 1 drop into both eyes 2 (two) times a day.    . rosuvastatin (CRESTOR) 20 MG tablet Take 1 tablet (20 mg total) by mouth  daily. 90 tablet 3  . sotalol (BETAPACE) 80 MG tablet Take 1 tablet (80 mg total) by mouth 2 (two) times daily. 180 tablet 1  . traZODone (DESYREL) 50 MG tablet Take 50 mg by mouth at bedtime.     Marland Kitchen warfarin (COUMADIN) 5 MG tablet TAKE 1/2 TABLET BY MOUTH DAILY OR AS DIRECTED BY THE COUMADIN CLINIC 60 tablet 1   No current facility-administered medications for this visit.    Allergies:   Morphine and related and Codeine   Social History:  The patient  reports that she has never smoked. She has never used smokeless tobacco. She reports that she does not drink alcohol and does not use drugs.   ROS:  Please see the history of present illness.   All other systems are personally reviewed and negative.    Exam:    Vital Signs:  There were no vitals taken for this visit.  Well sounding, alert and conversant   Labs/Other Tests and Data Reviewed:    Recent Labs: 09/26/2019: ALT 22   Wt Readings from Last 3 Encounters:  09/22/19 151 lb 9.6 oz (68.8 kg)  09/14/19 152 lb (68.9 kg)  05/23/19 154 lb 12.8 oz (  70.2 kg)     Last device remote is reviewed from [facility] PDF which reveals normal device function, afib burden is 24%   ASSESSMENT & PLAN:    1.  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 Atrial fibrillation afib burden is 24 % (previously 2.7% when I saw her in 2018) she has failed medical therapy with sotalol  she is anticoagulated with coumadin.  Her INRs are frequently > 3. Therapeutic strategies for afib including medicine and ablation were discussed in detail with the patient today. Risk, benefits, and alternatives to EP study and radiofrequency ablation for afib were also discussed in detail today. These risks include but are not limited to stroke, bleeding, vascular damage, tamponade, perforation, damage to the esophagus, lungs, and other structures, pulmonary vein stenosis, worsening renal function, and death. The patient understands these risk and wishes to  proceed.  We will therefore proceed with catheter ablation at the next available time.  Carto, ICE, anesthesia are requested for the procedure.  Will also obtain cardiac CT prior to the procedure to exclude LAA thrombus and further evaluate atrial anatomy. We will need to stop coumadin and start xarelto 20mg  daily.  Update 2d echo  3. HTN Stable No change required today Echo to evaluate for structural changes  Risks, benefits and potential toxicities for medications prescribed and/or refilled reviewed with patient today.   Patient Risk:  after full review of this patients clinical status, I feel that they are at moderate risk at this time.  Today, I have spent 15 minutes with the patient with telehealth technology discussing arrhythmia management .     Anticoagulation instructions: The patient does not need to hold their anticoagulation.   Medication instructions morning of: The patient should hold ALL medications the morning of the procedure   Discharge: Our plan will be to discharge the patient same day after a period of observation     Signed, Thompson Grayer, MD  04/08/2020 4:29 PM     [facility] [address] [place] [address] [phone] (office) [phone] (fax)

## 2020-04-08 NOTE — Telephone Encounter (Signed)
-----   Message from Thompson Grayer, MD sent at 04/08/2020  4:17 PM EDT ----- Afib ablation C/I/A  Cardiac CT Needs a 2D echo anytime now   Stop coumadin and start xarelto 20mg  daily at least 3 weeks prior to ablation.

## 2020-04-12 ENCOUNTER — Telehealth: Payer: Self-pay

## 2020-04-12 DIAGNOSIS — I4891 Unspecified atrial fibrillation: Secondary | ICD-10-CM

## 2020-04-12 NOTE — Telephone Encounter (Signed)
Pt scheduled for afib ablation on 05/21/20  Will get lab work and change to [employer] after echo on 9/8.  Will meet with Pt and go over instructions  covid test scheduled  Work up complete

## 2020-04-12 NOTE — Telephone Encounter (Signed)
-----   Message from Thompson Grayer, MD sent at 04/08/2020  4:17 PM EDT ----- Afib ablation C/I/A  Cardiac CT Needs a 2D echo anytime now   Stop coumadin and start xarelto 20mg  daily at least 3 weeks prior to ablation.

## 2020-04-24 ENCOUNTER — Other Ambulatory Visit: Payer: Self-pay

## 2020-04-24 ENCOUNTER — Other Ambulatory Visit: Payer: Medicare Other | Admitting: *Deleted

## 2020-04-24 ENCOUNTER — Ambulatory Visit (HOSPITAL_COMMUNITY): Payer: Medicare Other | Attending: Internal Medicine

## 2020-04-24 ENCOUNTER — Telehealth: Payer: Self-pay

## 2020-04-24 ENCOUNTER — Ambulatory Visit: Payer: Medicare Other | Admitting: *Deleted

## 2020-04-24 DIAGNOSIS — I4891 Unspecified atrial fibrillation: Secondary | ICD-10-CM

## 2020-04-24 DIAGNOSIS — Z5181 Encounter for therapeutic drug level monitoring: Secondary | ICD-10-CM | POA: Diagnosis not present

## 2020-04-24 DIAGNOSIS — I480 Paroxysmal atrial fibrillation: Secondary | ICD-10-CM | POA: Diagnosis present

## 2020-04-24 LAB — CBC WITH DIFFERENTIAL/PLATELET
Basophils Absolute: 0 10*3/uL (ref 0.0–0.2)
Basos: 1 %
EOS (ABSOLUTE): 0.1 10*3/uL (ref 0.0–0.4)
Eos: 2 %
Hematocrit: 40 % (ref 34.0–46.6)
Hemoglobin: 12.9 g/dL (ref 11.1–15.9)
Immature Grans (Abs): 0 10*3/uL (ref 0.0–0.1)
Immature Granulocytes: 0 %
Lymphocytes Absolute: 2 10*3/uL (ref 0.7–3.1)
Lymphs: 32 %
MCH: 26.6 pg (ref 26.6–33.0)
MCHC: 32.3 g/dL (ref 31.5–35.7)
MCV: 83 fL (ref 79–97)
Monocytes Absolute: 0.6 10*3/uL (ref 0.1–0.9)
Monocytes: 9 %
Neutrophils Absolute: 3.4 10*3/uL (ref 1.4–7.0)
Neutrophils: 56 %
Platelets: 250 10*3/uL (ref 150–450)
RBC: 4.85 x10E6/uL (ref 3.77–5.28)
RDW: 15.4 % (ref 11.7–15.4)
WBC: 6.2 10*3/uL (ref 3.4–10.8)

## 2020-04-24 LAB — POCT INR: INR: 4 — AB (ref 2.0–3.0)

## 2020-04-24 LAB — BASIC METABOLIC PANEL
BUN/Creatinine Ratio: 14 (ref 12–28)
BUN: 13 mg/dL (ref 8–27)
CO2: 25 mmol/L (ref 20–29)
Calcium: 10.1 mg/dL (ref 8.7–10.3)
Chloride: 100 mmol/L (ref 96–106)
Creatinine, Ser: 0.91 mg/dL (ref 0.57–1.00)
GFR calc Af Amer: 69 mL/min/{1.73_m2} (ref >59)
GFR calc non Af Amer: 60 mL/min/{1.73_m2} (ref >59)
Glucose: 79 mg/dL (ref 65–99)
Potassium: 4.9 mmol/L (ref 3.5–5.2)
Sodium: 138 mmol/L (ref 134–144)

## 2020-04-24 LAB — ECHOCARDIOGRAM COMPLETE
AR max vel: 1.99 cm2
AV Area VTI: 2.18 cm2
AV Area mean vel: 1.94 cm2
AV Mean grad: 5 mmHg
AV Peak grad: 9.5 mmHg
Ao pk vel: 1.55 m/s
Area-P 1/2: 4.31 cm2
S' Lateral: 2.4 cm

## 2020-04-24 NOTE — Telephone Encounter (Signed)
error 

## 2020-04-24 NOTE — Patient Instructions (Signed)
Description    Eat an extra serving of greens today. Stop taking warfarin and tomorrow evening start taking Xarelto, 1 tablet daily with dinner (largest meal of the day).  A full discussion of the nature of anticoagulants has been carried out.  A benefit/risk analysis has been presented to the patient, so that they understand the justification for choosing anticoagulation with Xarelto at this time.  The need for compliance is stressed.  Pt is aware to take the medication once daily with the largest meal of the day.  Side effects of potential bleeding are discussed, including unusual colored urine or stools, coughing up blood or coffee ground emesis, nose bleeds or serious fall or head trauma.  Discussed signs and symptoms of stroke. The patient should avoid any OTC items containing aspirin or ibuprofen.  Avoid alcohol consumption.   Call if any signs of abnormal bleeding.

## 2020-05-06 ENCOUNTER — Telehealth: Payer: Self-pay | Admitting: Internal Medicine

## 2020-05-06 NOTE — Telephone Encounter (Signed)
New Message:    Pt is scheduled for an Ablation  On 05-21-20. She wants to know if it is alright she have her flu shot tomorrow?

## 2020-05-07 NOTE — Telephone Encounter (Signed)
Called patient and okayed flu vaccine. Verbalized understanding

## 2020-05-10 ENCOUNTER — Other Ambulatory Visit: Payer: Self-pay | Admitting: Internal Medicine

## 2020-05-10 MED ORDER — RIVAROXABAN 20 MG PO TABS: 20.0000 mg | ORAL_TABLET | Freq: Every day | ORAL | 1 refills | Status: DC

## 2020-05-10 NOTE — Telephone Encounter (Signed)
Pt last saw Dr Rayann Heman 04/08/20 telemedicine Covid-19, last labs 04/24/20 Creat 0.91, age 79, weight 68.8kg, CrCl 53.55, based on CrCl pt is on appropriate dosage of Eliquis 5mg  BID.  Will refill rx.

## 2020-05-10 NOTE — Addendum Note (Signed)
Addended by: Brynda Peon on: 05/10/2020 03:58 PM   Modules accepted: Orders

## 2020-05-10 NOTE — Telephone Encounter (Signed)
*  STAT* If patient is at the pharmacy, call can be transferred to refill team.   1. Which medications need to be refilled? (please list name of each medication and dose if known) rivaroxaban (XARELTO) 20 MG TABS tablet   2. Which pharmacy/location (including street and city if local pharmacy) is medication to be sent to?  [facility] - [place] - [address]    3. Do they need a 30 day or 90 day supply? 90  

## 2020-05-15 ENCOUNTER — Telehealth (HOSPITAL_COMMUNITY): Payer: Self-pay | Admitting: Emergency Medicine

## 2020-05-15 NOTE — Telephone Encounter (Signed)
Left voice mail to call back 

## 2020-05-15 NOTE — Telephone Encounter (Signed)
Spoke to the patient and answered all questions.

## 2020-05-15 NOTE — Telephone Encounter (Signed)
Reaching out to patient to offer assistance regarding upcoming cardiac imaging study; pt verbalizes understanding of appt date/time, parking situation and where to check in, pre-test NPO status and medications ordered, and verified current allergies; name and call back number provided for further questions should they arise Marchia Bond RN Navigator Card[facility] [place] [facility] [phone] office [phone] cell  Pt states she is to take 100mg  PO metoprolol 2 hr prior to scan rather than her typical 75mg .   Pt also states she has had an abd pain since Sunday and wanted to make sure its not going to interfere with the test tomorrow. I assured her it will not. She states she has been able to resume normal activities despite the pain but wanted to talk to [facility] nurse Sonia Baller about it.  Clarise Cruz

## 2020-05-15 NOTE — Telephone Encounter (Signed)
Patient returning call.

## 2020-05-16 ENCOUNTER — Ambulatory Visit (HOSPITAL_COMMUNITY)
Admission: RE | Admit: 2020-05-16 | Discharge: 2020-05-16 | Disposition: A | Discharge status: Home / Self Care | Payer: Medicare Other | Source: Ambulatory Visit | Attending: Internal Medicine | Admitting: Internal Medicine

## 2020-05-16 ENCOUNTER — Other Ambulatory Visit: Payer: Self-pay

## 2020-05-16 DIAGNOSIS — I4891 Unspecified atrial fibrillation: Secondary | ICD-10-CM

## 2020-05-16 MED ORDER — IOHEXOL 350 MG/ML SOLN
80.0000 mL | Freq: Once | INTRAVENOUS | Status: AC | PRN
  Administered 2020-05-16: 80 mL via INTRAVENOUS

## 2020-05-16 MED ORDER — METOPROLOL TARTRATE 5 MG/5ML IV SOLN: 10.0000 mg | Freq: Once | INTRAVENOUS | Status: AC

## 2020-05-16 MED ORDER — METOPROLOL TARTRATE 5 MG/5ML IV SOLN
INTRAVENOUS | Status: AC
  Administered 2020-05-16: 10 mg via INTRAVENOUS
  Filled 2020-05-16: qty 10

## 2020-05-17 ENCOUNTER — Other Ambulatory Visit (HOSPITAL_COMMUNITY)
Admission: RE | Admit: 2020-05-17 | Discharge: 2020-05-17 | Disposition: A | Discharge status: Home / Self Care | Payer: Medicare Other | Source: Ambulatory Visit | Attending: Internal Medicine | Admitting: Internal Medicine

## 2020-05-17 ENCOUNTER — Other Ambulatory Visit (HOSPITAL_COMMUNITY): Payer: Medicare Other

## 2020-05-17 DIAGNOSIS — Z20822 Contact with and (suspected) exposure to covid-19: Secondary | ICD-10-CM | POA: Insufficient documentation

## 2020-05-17 DIAGNOSIS — Z01812 Encounter for preprocedural laboratory examination: Secondary | ICD-10-CM | POA: Insufficient documentation

## 2020-05-17 LAB — SARS CORONAVIRUS 2 (TAT 6-24 HRS): SARS Coronavirus 2: NEGATIVE

## 2020-05-20 NOTE — Progress Notes (Signed)
Instructed patient on the following items: Arrival time 0830 Nothing to eat or drink after midnight No meds AM of procedure Responsible person to drive you home and stay with you for 24 hrs  Have you missed any doses of anti-coagulant on Xarelto- hasn't missed any doses

## 2020-05-21 ENCOUNTER — Encounter (HOSPITAL_COMMUNITY)
Admission: RE | Disposition: A | Discharge status: Home / Self Care | Payer: Medicare Other | Source: Home / Self Care | Attending: Internal Medicine

## 2020-05-21 ENCOUNTER — Other Ambulatory Visit: Payer: Self-pay

## 2020-05-21 ENCOUNTER — Ambulatory Visit (HOSPITAL_COMMUNITY): Payer: Medicare Other | Admitting: Certified Registered"

## 2020-05-21 ENCOUNTER — Ambulatory Visit (HOSPITAL_COMMUNITY)
Admission: RE | Admit: 2020-05-21 | Discharge: 2020-05-21 | Disposition: A | Discharge status: Home / Self Care | Payer: Medicare Other | Attending: Internal Medicine | Admitting: Internal Medicine

## 2020-05-21 DIAGNOSIS — Z885 Allergy status to narcotic agent status: Secondary | ICD-10-CM | POA: Diagnosis not present

## 2020-05-21 DIAGNOSIS — I4819 Other persistent atrial fibrillation: Secondary | ICD-10-CM

## 2020-05-21 DIAGNOSIS — I2510 Atherosclerotic heart disease of native coronary artery without angina pectoris: Secondary | ICD-10-CM | POA: Insufficient documentation

## 2020-05-21 DIAGNOSIS — I10 Essential (primary) hypertension: Secondary | ICD-10-CM | POA: Diagnosis not present

## 2020-05-21 DIAGNOSIS — K219 Gastro-esophageal reflux disease without esophagitis: Secondary | ICD-10-CM | POA: Insufficient documentation

## 2020-05-21 DIAGNOSIS — Z79899 Other long term (current) drug therapy: Secondary | ICD-10-CM | POA: Diagnosis not present

## 2020-05-21 DIAGNOSIS — Z7901 Long term (current) use of anticoagulants: Secondary | ICD-10-CM | POA: Diagnosis not present

## 2020-05-21 DIAGNOSIS — E7800 Pure hypercholesterolemia, unspecified: Secondary | ICD-10-CM | POA: Insufficient documentation

## 2020-05-21 DIAGNOSIS — Z85828 Personal history of other malignant neoplasm of skin: Secondary | ICD-10-CM | POA: Insufficient documentation

## 2020-05-21 HISTORY — PX: ATRIAL FIBRILLATION ABLATION: EP1191

## 2020-05-21 SURGERY — ATRIAL FIBRILLATION ABLATION
Anesthesia: General

## 2020-05-21 MED ORDER — PHENYLEPHRINE HCL-NACL 10-0.9 MG/250ML-% IV SOLN
INTRAVENOUS | Status: DC | PRN
  Administered 2020-05-21: 30 ug/min via INTRAVENOUS

## 2020-05-21 MED ORDER — METOPROLOL TARTRATE 50 MG PO TABS
50.0000 mg | ORAL_TABLET | Freq: Once | ORAL | Status: AC
  Administered 2020-05-21: 50 mg via ORAL
  Filled 2020-05-21: qty 1

## 2020-05-21 MED ORDER — PROPOFOL 500 MG/50ML IV EMUL
INTRAVENOUS | Status: DC | PRN
  Administered 2020-05-21: 20 ug/kg/min via INTRAVENOUS

## 2020-05-21 MED ORDER — SODIUM CHLORIDE 0.9 % IV SOLN: INTRAVENOUS | Status: DC

## 2020-05-21 MED ORDER — HEPARIN (PORCINE) IN NACL 1000-0.9 UT/500ML-% IV SOLN
INTRAVENOUS | Status: AC
  Filled 2020-05-21: qty 500

## 2020-05-21 MED ORDER — SODIUM CHLORIDE 0.9% FLUSH: 3.0000 mL | INTRAVENOUS | Status: DC | PRN

## 2020-05-21 MED ORDER — HEPARIN SODIUM (PORCINE) 1000 UNIT/ML IJ SOLN
INTRAMUSCULAR | Status: DC | PRN
  Administered 2020-05-21: 1000 [IU] via INTRAVENOUS
  Administered 2020-05-21: 14000 [IU] via INTRAVENOUS

## 2020-05-21 MED ORDER — PROPOFOL 10 MG/ML IV BOLUS
INTRAVENOUS | Status: DC | PRN
  Administered 2020-05-21: 130 mg via INTRAVENOUS

## 2020-05-21 MED ORDER — ADENOSINE 6 MG/2ML IV SOLN
INTRAVENOUS | Status: DC | PRN
  Administered 2020-05-21 (×2): 12 mg via INTRAVENOUS

## 2020-05-21 MED ORDER — ISOPROTERENOL HCL 0.2 MG/ML IJ SOLN
INTRAMUSCULAR | Status: AC
  Filled 2020-05-21: qty 5

## 2020-05-21 MED ORDER — ONDANSETRON HCL 4 MG/2ML IJ SOLN
INTRAMUSCULAR | Status: DC | PRN
  Administered 2020-05-21: 4 mg via INTRAVENOUS

## 2020-05-21 MED ORDER — HEPARIN SODIUM (PORCINE) 1000 UNIT/ML IJ SOLN
INTRAMUSCULAR | Status: AC
  Filled 2020-05-21: qty 1

## 2020-05-21 MED ORDER — ISOPROTERENOL HCL 0.2 MG/ML IJ SOLN
INTRAVENOUS | Status: DC | PRN
  Administered 2020-05-21: 10 ug/min via INTRAVENOUS

## 2020-05-21 MED ORDER — ADENOSINE 6 MG/2ML IV SOLN
INTRAVENOUS | Status: AC
  Filled 2020-05-21: qty 4

## 2020-05-21 MED ORDER — SUGAMMADEX SODIUM 200 MG/2ML IV SOLN
INTRAVENOUS | Status: DC | PRN
  Administered 2020-05-21: 150 mg via INTRAVENOUS

## 2020-05-21 MED ORDER — SODIUM CHLORIDE 0.9% FLUSH: 3.0000 mL | Freq: Two times a day (BID) | INTRAVENOUS | Status: DC

## 2020-05-21 MED ORDER — PROTAMINE SULFATE 10 MG/ML IV SOLN
INTRAVENOUS | Status: DC | PRN
  Administered 2020-05-21: 30 mg via INTRAVENOUS

## 2020-05-21 MED ORDER — FENTANYL CITRATE (PF) 100 MCG/2ML IJ SOLN
INTRAMUSCULAR | Status: DC | PRN
  Administered 2020-05-21: 100 ug via INTRAVENOUS

## 2020-05-21 MED ORDER — ROCURONIUM BROMIDE 10 MG/ML (PF) SYRINGE
PREFILLED_SYRINGE | INTRAVENOUS | Status: DC | PRN
  Administered 2020-05-21: 50 mg via INTRAVENOUS

## 2020-05-21 MED ORDER — ONDANSETRON HCL 4 MG/2ML IJ SOLN: 4.0000 mg | Freq: Four times a day (QID) | INTRAMUSCULAR | Status: DC | PRN

## 2020-05-21 MED ORDER — SODIUM CHLORIDE 0.9 % IV SOLN: 250.0000 mL | INTRAVENOUS | Status: DC | PRN

## 2020-05-21 MED ORDER — ACETAMINOPHEN 325 MG PO TABS
650.0000 mg | ORAL_TABLET | ORAL | Status: DC | PRN
  Filled 2020-05-21: qty 2

## 2020-05-21 MED ORDER — HEPARIN (PORCINE) IN NACL 2-0.9 UNITS/ML
INTRAMUSCULAR | Status: AC | PRN
  Administered 2020-05-21: 500 mL

## 2020-05-21 MED ORDER — LIDOCAINE 2% (20 MG/ML) 5 ML SYRINGE
INTRAMUSCULAR | Status: DC | PRN
  Administered 2020-05-21: 60 mg via INTRAVENOUS

## 2020-05-21 SURGICAL SUPPLY — 19 items
BLANKET WARM UNDERBOD FULL ACC (MISCELLANEOUS) ×3 IMPLANT
CATH 8FR REPROCESSED SOUNDSTAR (CATHETERS) ×3 IMPLANT
CATH MAPPNG PENTARAY F 2-6-2MM (CATHETERS) ×1 IMPLANT
CATH SMTCH THERMOCOOL SF DF (CATHETERS) ×3 IMPLANT
CATH SMTCH THERMOCOOL SF FJ (CATHETERS) ×3 IMPLANT
CATH WEBSTER BI DIR CS D-F CRV (CATHETERS) ×3 IMPLANT
CLOSURE PERCLOSE PROSTYLE (VASCULAR PRODUCTS) ×9 IMPLANT
COVER SWIFTLINK CONNECTOR (BAG) ×3 IMPLANT
NEEDLE BAYLIS TRANSSEPTAL 71CM (NEEDLE) ×3 IMPLANT
PACK EP LATEX FREE (CUSTOM PROCEDURE TRAY) ×3
PACK EP LF (CUSTOM PROCEDURE TRAY) ×1 IMPLANT
PAD PRO RADIOLUCENT 2001M-C (PAD) ×3 IMPLANT
PATCH CARTO3 (PAD) ×3 IMPLANT
PENTARAY F 2-6-2MM (CATHETERS) ×3
SHEATH PINNACLE 7F 10CM (SHEATH) ×6 IMPLANT
SHEATH PINNACLE 9F 10CM (SHEATH) ×3 IMPLANT
SHEATH PROBE COVER 6X72 (BAG) ×3 IMPLANT
SHEATH SWARTZ TS SL2 63CM 8.5F (SHEATH) ×3 IMPLANT
TUBING SMART ABLATE COOLFLOW (TUBING) ×3 IMPLANT

## 2020-05-21 NOTE — H&P (Signed)
Chief Complaint:  palpitations  History of Present Illness:    Monica [name] is a 79 y.o. female who presents for EP and ablation of afib.  She continues to have increasing frequency of afib.  + palpitations and fatigue during afib.  Today, she denies symptoms of chest pain, shortness of breath,  lower extremity edema, dizziness, presyncope, or syncope.  The patient is otherwise without complaint today.         Past Medical History:  Diagnosis Date  . Adrenal adenoma 6/11   lt on ct 6/11  . Atrial fibrillation ([place]) 01/21/2011  . Atrial flutter ([place]) 05/16/2012  . BCC (basal cell carcinoma of skin) 05/22/2013   Right Upper Lip (curet, cautery, and excision)  . BCC (basal cell carcinoma of skin) 08/24/2013   Right Upper Lip(Sclerosis + margin) (MOH's)  . Bradycardia 01/21/2011  . Coronary artery calcification seen on CT scan 11/01/2018  . GERD (gastroesophageal reflux disease)   . Hypercholesteremia   . Hypertension   . Kidney stone 6/11   rt. on ct 6/11  . 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 Osteoporosis   . Palpitations 05/30/2014  . Paroxysmal atrial fibrillation ([place])    a. PVI 10/13 b. PVI 12/15  . Superficial nodular basal cell carcinoma (BCC) 06/13/2019   Above Left Brow (curet and 5FU)  . Superficial nodular basal cell carcinoma (BCC) 06/13/2019   Right Cheekbone (curet and 5FU)         Past Surgical History:  Procedure Laterality Date  . ATRIAL FIBRILLATION ABLATION N/A 06/07/2012   PVI and CTI ablation by Dr Rayann Heman  . ATRIAL FIBRILLATION ABLATION N/A 07/31/2014   PVI by Dr Rayann Heman; initial ablation 2013  . CARDIOVERSION  05/06/2012   Procedure: CARDIOVERSION;  Surgeon: Sueanne Margarita, MD;  Location: [facility] ENDOSCOPY;  Service: Cardiovascular;  Laterality: N/A;  h/p in file drawer  . CARDIOVERSION N/A 10/06/2017   Procedure: CARDIOVERSION;  Surgeon: Skeet Latch, MD;  Location: [facility]  ENDOSCOPY;  Service: Cardiovascular;  Laterality: N/A;  . CARDIOVERSION N/A 03/28/2018   Procedure: CARDIOVERSION;  Surgeon: Dorothy Spark, MD;  Location: [facility] ENDOSCOPY;  Service: Cardiovascular;  Laterality: N/A;  . CARDIOVERSION N/A 02/09/2019   Procedure: CARDIOVERSION;  Surgeon: Pixie Casino, MD;  Location: [place];  Service: Cardiovascular;  Laterality: N/A;  . CESAREAN SECTION  1978-1979   x-2  . CHOLECYSTECTOMY  1989  . ELECTROPHYSIOLOGIC STUDY N/A 09/08/2016   Procedure: Cardioversion;  Surgeon: Thompson Grayer, MD;  Location: [place] CV LAB;  Service: Cardiovascular;  Laterality: N/A;  . EP IMPLANTABLE DEVICE N/A 09/08/2016   Procedure: Loop Recorder Removal;  Surgeon: Thompson Grayer, MD;  Location: [place] CV LAB;  Service: Cardiovascular;  Laterality: N/A;  . EP IMPLANTABLE DEVICE N/A 09/08/2016   Procedure: Pacemaker Implant;  Surgeon: Thompson Grayer, MD;  Location: [place] CV LAB;  Service: Cardiovascular;  Laterality: N/A;  . Implantable loop recorder implantation  05/30/14   Medtronic Reveal LINQ implanted by Dr Rayann Heman with RIO II protocol (ambulatory setting)  . TEE WITHOUT CARDIOVERSION  06/06/2012   Procedure: TRANSESOPHAGEAL ECHOCARDIOGRAM (TEE);  Surgeon: Jettie Booze, MD;  Location: [place];  Service: Cardiovascular;  Laterality: N/A;  . TEE WITHOUT CARDIOVERSION N/A 07/30/2014   Procedure: TRANSESOPHAGEAL ECHOCARDIOGRAM (TEE);  Surgeon: Larey Dresser, MD;  Location: [place];  Service: Cardiovascular;  Laterality: N/A;  . TEE WITHOUT CARDIOVERSION N/A 10/06/2017   Procedure: TRANSESOPHAGEAL ECHOCARDIOGRAM (  TEE);  Surgeon: Skeet Latch, MD;  Location: [place];  Service: Cardiovascular;  Laterality: N/A;  . TEE WITHOUT CARDIOVERSION N/A 03/28/2018   Procedure: TRANSESOPHAGEAL ECHOCARDIOGRAM (TEE);  Surgeon: Dorothy Spark, MD;  Location: [facility] ENDOSCOPY;  Service: Cardiovascular;  Laterality: N/A;  . TEE WITHOUT  CARDIOVERSION N/A 09/30/2018   Procedure: TRANSESOPHAGEAL ECHOCARDIOGRAM (TEE);  Surgeon: Elouise Munroe, MD;  Location: [facility] ENDOSCOPY;  Service: Cardiovascular;  Laterality: N/A;  . TEE WITHOUT CARDIOVERSION N/A 02/09/2019   Procedure: TRANSESOPHAGEAL ECHOCARDIOGRAM (TEE);  Surgeon: Pixie Casino, MD;  Location: [facility] ENDOSCOPY;  Service: Cardiovascular;  Laterality: N/A;    Current Outpatient Medications  Medication Sig Dispense Refill  . acetaminophen (TYLENOL) 500 MG tablet Take 250-500 mg by mouth daily as needed for moderate pain.     Marland Kitchen alum & mag hydroxide-simeth (MAALOX/MYLANTA) 200-200-20 MG/5ML suspension Take 15 mLs by mouth every 6 (six) hours as needed for indigestion or heartburn.    . calcium carbonate (OS-CAL) 600 MG TABS tablet Take 600 mg by mouth 2 (two) times daily with a meal.     . cholecalciferol (VITAMIN D) 1000 UNITS tablet Take 1,000 Units by mouth every evening.     . metoprolol tartrate (LOPRESSOR) 50 MG tablet TAKE 1 AND 1/2 TABLETS BY MOUTH TWICE DAILY 135 tablet 1  . pantoprazole (PROTONIX) 40 MG tablet Take 40 mg by mouth daily before breakfast.     . Polyethyl Glycol-Propyl Glycol (SYSTANE ULTRA PF OP) Place 1 drop into both eyes 2 (two) times a day.    . rosuvastatin (CRESTOR) 20 MG tablet Take 1 tablet (20 mg total) by mouth daily. 90 tablet 3  . sotalol (BETAPACE) 80 MG tablet Take 1 tablet (80 mg total) by mouth 2 (two) times daily. 180 tablet 1  . traZODone (DESYREL) 50 MG tablet Take 50 mg by mouth at bedtime.     Marland Kitchen warfarin (COUMADIN) 5 MG tablet TAKE 1/2 TABLET BY MOUTH DAILY OR AS DIRECTED BY THE COUMADIN CLINIC 60 tablet 1   No current facility-administered medications for this visit.    Allergies:   Morphine and related and Codeine   Social History:  The patient  reports that she has never smoked. She has never used smokeless tobacco. She reports that she does not drink alcohol and does not use drugs.   ROS:  Please see  the history of present illness.   All other systems are personally reviewed and negative.   Physical Exam: Vitals:   05/21/20 0838  BP: (!) 178/101  Pulse: 85  Resp: 15  Temp: 98 F (36.7 C)  TempSrc: Oral  SpO2: 99%  Weight: 67.6 kg  Height: 5\' 3"  (1.6 m)    GEN- The patient is well appearing, alert and oriented x 3 today.   Head- normocephalic, atraumatic Eyes-  Sclera clear, conjunctiva pink Ears- hearing intact Oropharynx- clear Neck- supple, Lungs- Clear to ausculation bilaterally, normal work of breathing Heart- Regular rate and rhythm, no murmurs, rubs or gallops, PMI not laterally displaced GI- soft, NT, ND, + BS Extremities- no clubbing, cyanosis, or edema, groin is without hematoma/ bruit MS- no significant deformity or atrophy Skin- no rash or lesion Psych- euthymic mood, full affect Neuro- strength and sensation are intact   Labs/Other Tests and Data Reviewed:    Recent Labs: 09/26/2019: ALT 22      Wt Readings from Last 3 Encounters:  09/22/19 151 lb 9.6 oz (68.8 kg)  09/14/19 152 lb (68.9 kg)  05/23/19 154  lb 12.8 oz (70.2 kg)     Last device remote is reviewed from [facility] PDF which reveals normal device function, afib burden is 24%   ASSESSMENT & PLAN:    1. Atrial fibrillation  Risk, benefits, and alternatives to EP study and radiofrequency ablation for afib were also discussed in detail today. These risks include but are not limited to stroke, bleeding, vascular damage, tamponade, perforation, damage to the esophagus, lungs, and other structures, pulmonary vein stenosis, worsening renal function, and death. The patient understands these risk and wishes to proceed.    Her cardiac CT was reviewd at length with her today.  She reports compliance with xarelto wtihout interruption.  Thompson Grayer MD, [place] 05/21/2020 9:42 AM

## 2020-05-21 NOTE — Discharge Instructions (Signed)
Cardiac Ablation, Care After  This sheet gives you information about how to care for yourself after your procedure. Your health care provider may also give you more specific instructions. If you have problems or questions, contact your health care provider. What can I expect after the procedure? After the procedure, it is common to have:  Bruising around your puncture site.  Tenderness around your puncture site.  Skipped heartbeats.  Tiredness (fatigue).  Follow these instructions at home: Puncture site care   Follow instructions from your health care provider about how to take care of your puncture site. Make sure you: ? If present, leave stitches (sutures), skin glue, or adhesive strips in place. These skin closures may need to stay in place for up to 2 weeks. If adhesive strip edges start to loosen and curl up, you may trim the loose edges. Do not remove adhesive strips completely unless your health care provider tells you to do that. ? Remove bandage in 24 hours. Then you may shower.  Check your puncture site every day for signs of infection. Check for: ? Redness, swelling, or pain. ? Fluid or blood. If your puncture site starts to bleed, lie down on your back, apply firm pressure to the area, and contact your health care provider. ? Warmth. ? Pus or a bad smell. Driving  Do not drive for at least 4 days after your procedure or however long your health care provider recommends. (Do not resume driving if you have previously been instructed not to drive for other health reasons.)  Do not drive or use heavy machinery while taking prescription pain medicine. Activity  Avoid activities that take a lot of effort for at least 7 days after your procedure.  Do not lift anything that is heavier than 5 lb (4.5 kg) for one week.   No sexual activity for 1 week.   Return to your normal activities as told by your health care provider. Ask your health care provider what activities are  safe for you. General instructions  Take over-the-counter and prescription medicines only as told by your health care provider.  Do not use any products that contain nicotine or tobacco, such as cigarettes and e-cigarettes. If you need help quitting, ask your health care provider.  You may shower after 24 hours, but Do not take baths, swim, or use a hot tub for 1 week.   Do not drink alcohol for 24 hours after your procedure.  Keep all follow-up visits as told by your health care provider. This is important. Contact a health care provider if:  You have redness, mild swelling, or pain around your puncture site.  You have fluid or blood coming from your puncture site that stops after applying firm pressure to the area.  Your puncture site feels warm to the touch.  You have pus or a bad smell coming from your puncture site.  You have a fever.  You have chest pain or discomfort that spreads to your neck, jaw, or arm.  You are sweating a lot.  You feel nauseous.  You have a fast or irregular heartbeat.  You have shortness of breath.  You are dizzy or light-headed and feel the need to lie down.  You have pain or numbness in the arm or leg closest to your puncture site. Get help right away if:  Your puncture site suddenly swells.  Your puncture site is bleeding and the bleeding does not stop after applying firm pressure to the area. These  symptoms may represent a serious problem that is an emergency. Do not wait to see if the symptoms will go away. Get medical help right away. Call your local emergency services (911 in the [place] Do not drive yourself to the hospital. Summary  After the procedure, it is normal to have bruising and tenderness at the puncture site in your groin, neck, or forearm.  Check your puncture site every day for signs of infection.  Get help right away if your puncture site is bleeding and the bleeding does not stop after applying firm pressure to the  area. This is a medical emergency. This information is not intended to replace advice given to you by your health care provider. Make sure you discuss any questions you have with your health care provider.

## 2020-05-21 NOTE — Anesthesia Preprocedure Evaluation (Addendum)
Anesthesia Evaluation  Patient identified by MRN, date of birth, ID band Patient awake    Reviewed: Allergy & Precautions, NPO status , Patient's Chart, lab work & pertinent test results, reviewed documented beta blocker date and time   History of Anesthesia Complications Negative for: history of anesthetic complications  Airway Mallampati: II  TM Distance: >3 FB Neck ROM: Full    Dental  (+) Dental Advisory Given, Teeth Intact   Pulmonary neg pulmonary ROS,    Pulmonary exam normal        Cardiovascular hypertension, Pt. on medications and Pt. on home beta blockers + CAD  + dysrhythmias Atrial Fibrillation  Rhythm:Irregular Rate:Normal   '21 TTE - EF 60 to 65%. Grade II diastolic dysfunction (pseudonormalization). The right ventricular size is mildly enlarged. There is normal pulmonary artery systolic  pressure. Left atrial size was moderately dilated. Right atrial size was mildly dilated. Aortic valve regurgitation is trivial. Mild to moderate aortic valve sclerosis/calcification is present, without any evidence of aortic stenosis.     Neuro/Psych negative neurological ROS  negative psych ROS   GI/Hepatic Neg liver ROS, GERD  Medicated and Controlled,  Endo/Other  negative endocrine ROS  Renal/GU negative Renal ROS     Musculoskeletal negative musculoskeletal ROS (+)   Abdominal   Peds  Hematology  On xarelto    Anesthesia Other Findings Covid test negative   Reproductive/Obstetrics                            Anesthesia Physical Anesthesia Plan  ASA: III  Anesthesia Plan: General   Post-op Pain Management:    Induction: Intravenous  PONV Risk Score and Plan: 3 and Treatment may vary due to age or medical condition, Ondansetron and Propofol infusion  Airway Management Planned: Oral ETT  Additional Equipment: None  Intra-op Plan:   Post-operative Plan: Extubation in  OR  Informed Consent: I have reviewed the patients History and Physical, chart, labs and discussed the procedure including the risks, benefits and alternatives for the proposed anesthesia with the patient or authorized representative who has indicated his/her understanding and acceptance.     Dental advisory given  Plan Discussed with: CRNA and Anesthesiologist  Anesthesia Plan Comments:        Anesthesia Quick Evaluation

## 2020-05-21 NOTE — Transfer of Care (Signed)
Immediate Anesthesia Transfer of Care Note  Patient: Monica [name]  Procedure(s) Performed: ATRIAL FIBRILLATION ABLATION (N/A )  Patient Location: PACU  Anesthesia Type:General  Level of Consciousness: awake, alert , oriented and patient cooperative  Airway & Oxygen Therapy: Patient Spontanous Breathing and Patient connected to nasal cannula oxygen  Post-op Assessment: Report given to RN and Post -op Vital signs reviewed and stable  Post vital signs: Reviewed and stable  Last Vitals:  Vitals Value Taken Time  BP 148/55 05/21/20 1240  Temp    Pulse 58 05/21/20 1245  Resp 18 05/21/20 1245  SpO2 98 % 05/21/20 1245  Vitals shown include unvalidated device data.  Last Pain:  Vitals:   05/21/20 1005  TempSrc:   PainSc: 0-No pain         Complications: No complications documented.

## 2020-05-21 NOTE — Anesthesia Procedure Notes (Signed)
Procedure Name: Intubation Date/Time: 05/21/2020 10:33 AM Performed by: Cleda Daub, CRNA Pre-anesthesia Checklist: Patient identified, Emergency Drugs available, Suction available and Patient being monitored Patient Re-evaluated:Patient Re-evaluated prior to induction Oxygen Delivery Method: Circle system utilized Preoxygenation: Pre-oxygenation with 100% oxygen Induction Type: IV induction Ventilation: Mask ventilation without difficulty Laryngoscope Size: Miller and 2 Grade View: Grade I Tube type: Oral Tube size: 7.0 mm Number of attempts: 1 Airway Equipment and Method: Stylet and Oral airway Placement Confirmation: ETT inserted through vocal cords under direct vision,  positive ETCO2 and breath sounds checked- equal and bilateral Secured at: 21 cm Tube secured with: Tape Dental Injury: Teeth and Oropharynx as per pre-operative assessment

## 2020-05-22 ENCOUNTER — Encounter (HOSPITAL_COMMUNITY): Payer: Self-pay | Admitting: Internal Medicine

## 2020-05-22 LAB — POCT ACTIVATED CLOTTING TIME
Activated Clotting Time: 351 seconds
Activated Clotting Time: 362 seconds

## 2020-05-22 NOTE — Progress Notes (Signed)
Same Day PCI D/C Phone Call   []  1st call         []  Answer.         []  No Answer    []  2nd call        []  Answer       []  No Answer   Is there bleeding or other abnormality from access site?  []  Yes    [x]  No  Has there been any chest pain, nausea or vomiting?    [x]  No  []  Yes chest pain   []  Yes nausea or vomiting  Have you sought any medical attention since discharge?    []  Yes  [x]  No  Remind Patient about their follow up appointment, review discharge & medication instructions.   [x]  Yes  []  No  Would you say you were satisfied with you Same Day Discharge?  (Disagree, Neutral, Agree)   []  Disagree, why  []  Neutral  [x]  Agree     

## 2020-05-22 NOTE — Anesthesia Postprocedure Evaluation (Signed)
Anesthesia Post Note  Patient: Monica [name]  Procedure(s) Performed: ATRIAL FIBRILLATION ABLATION (N/A )     Patient location during evaluation: PACU Anesthesia Type: General Level of consciousness: awake and alert Pain management: pain level controlled Vital Signs Assessment: post-procedure vital signs reviewed and stable Respiratory status: spontaneous breathing, nonlabored ventilation and respiratory function stable Cardiovascular status: blood pressure returned to baseline and stable Postop Assessment: no apparent nausea or vomiting Anesthetic complications: no   No complications documented.  Last Vitals:  Vitals:   05/21/20 1545 05/21/20 1615  BP: (!) 170/61 (!) 173/77  Pulse: 61 64  Resp: 16 12  Temp:    SpO2: 99% 99%    Last Pain:  Vitals:   05/21/20 1400  TempSrc:   PainSc: 0-No pain                 Audry Pili

## 2020-05-25 ENCOUNTER — Other Ambulatory Visit: Payer: Self-pay | Admitting: Internal Medicine

## 2020-05-25 ENCOUNTER — Other Ambulatory Visit: Payer: Self-pay | Admitting: Cardiology

## 2020-05-28 ENCOUNTER — Ambulatory Visit: Payer: Medicare Other | Admitting: Cardiology

## 2020-06-12 ENCOUNTER — Ambulatory Visit (INDEPENDENT_AMBULATORY_CARE_PROVIDER_SITE_OTHER): Payer: Medicare Other

## 2020-06-12 DIAGNOSIS — I480 Paroxysmal atrial fibrillation: Secondary | ICD-10-CM | POA: Diagnosis not present

## 2020-06-12 LAB — CUP PACEART REMOTE DEVICE CHECK
Battery Remaining Longevity: 71 mo
Battery Voltage: 3 V
Brady Statistic AP VP Percent: 0.18 %
Brady Statistic AP VS Percent: 90.58 %
Brady Statistic AS VP Percent: 0.02 %
Brady Statistic AS VS Percent: 9.22 %
Brady Statistic RA Percent Paced: 89.49 %
Brady Statistic RV Percent Paced: 0.25 %
Date Time Interrogation Session: 20211027103943
Implantable Lead Implant Date: 20180123
Implantable Lead Implant Date: 20180123
Implantable Lead Location: 753859
Implantable Lead Location: 753860
Implantable Lead Model: 5076
Implantable Lead Model: 5076
Implantable Pulse Generator Implant Date: 20180123
Lead Channel Impedance Value: 304 Ohm
Lead Channel Impedance Value: 342 Ohm
Lead Channel Impedance Value: 437 Ohm
Lead Channel Impedance Value: 532 Ohm
Lead Channel Pacing Threshold Amplitude: 0.5 V
Lead Channel Pacing Threshold Amplitude: 0.625 V
Lead Channel Pacing Threshold Pulse Width: 0.4 ms
Lead Channel Pacing Threshold Pulse Width: 0.4 ms
Lead Channel Sensing Intrinsic Amplitude: 2.125 mV
Lead Channel Sensing Intrinsic Amplitude: 2.125 mV
Lead Channel Sensing Intrinsic Amplitude: 6.375 mV
Lead Channel Sensing Intrinsic Amplitude: 6.375 mV
Lead Channel Setting Pacing Amplitude: 2 V
Lead Channel Setting Pacing Amplitude: 2.5 V
Lead Channel Setting Pacing Pulse Width: 0.4 ms
Lead Channel Setting Sensing Sensitivity: 2 mV

## 2020-06-17 NOTE — Progress Notes (Signed)
Remote pacemaker transmission.   

## 2020-06-18 ENCOUNTER — Ambulatory Visit (HOSPITAL_COMMUNITY)
Admission: RE | Admit: 2020-06-18 | Discharge: 2020-06-18 | Disposition: A | Discharge status: Home / Self Care | Payer: Medicare Other | Source: Ambulatory Visit | Attending: Nurse Practitioner | Admitting: Nurse Practitioner

## 2020-06-18 ENCOUNTER — Other Ambulatory Visit: Payer: Self-pay

## 2020-06-18 ENCOUNTER — Encounter (HOSPITAL_COMMUNITY): Payer: Self-pay | Admitting: Nurse Practitioner

## 2020-06-18 VITALS — BP 150/80 | HR 64 | Ht 63.0 in | Wt 148.4 lb

## 2020-06-18 DIAGNOSIS — Z7901 Long term (current) use of anticoagulants: Secondary | ICD-10-CM | POA: Insufficient documentation

## 2020-06-18 DIAGNOSIS — I480 Paroxysmal atrial fibrillation: Secondary | ICD-10-CM | POA: Diagnosis not present

## 2020-06-18 DIAGNOSIS — D6869 Other thrombophilia: Secondary | ICD-10-CM | POA: Diagnosis not present

## 2020-06-18 DIAGNOSIS — I10 Essential (primary) hypertension: Secondary | ICD-10-CM | POA: Diagnosis not present

## 2020-06-18 DIAGNOSIS — Z79899 Other long term (current) drug therapy: Secondary | ICD-10-CM | POA: Diagnosis not present

## 2020-06-18 DIAGNOSIS — I4891 Unspecified atrial fibrillation: Secondary | ICD-10-CM | POA: Insufficient documentation

## 2020-06-18 NOTE — Progress Notes (Signed)
Primary Care Physician: Carol Ada, MD Referring Physician: Dr. Lisette Abu Monica [name] is a 79 y.o. female with a h/o afib that is in the afib clinic one month after ablation. This is her 3rd ablation, one in 2013,  2015 and most recently 10/21. She has done well since the procedure. Just a couple of days of afib that self converted. No swallowing or groin issues.continues on xarelto 20 mg daily with a CHA2DS2VASc score of 4.   Today, she denies symptoms of palpitations, chest pain, shortness of breath, orthopnea, PND, lower extremity edema, dizziness, presyncope, syncope, or neurologic sequela. The patient is tolerating medications without difficulties and is otherwise without complaint today.   Past Medical History:  Diagnosis Date  . Adrenal adenoma 6/11   lt on ct 6/11  . Atrial fibrillation ([place]) 01/21/2011  . Atrial flutter ([place]) 05/16/2012  . BCC (basal cell carcinoma of skin) 05/22/2013   Right Upper Lip (curet, cautery, and excision)  . BCC (basal cell carcinoma of skin) 08/24/2013   Right Upper Lip(Sclerosis + margin) (MOH's)  . Bradycardia 01/21/2011  . Coronary artery calcification seen on CT scan 11/01/2018  . GERD (gastroesophageal reflux disease)   . Hypercholesteremia   . Hypertension   . Kidney stone 6/11   rt. on ct 6/11  . 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 Osteoporosis   . Palpitations 05/30/2014  . Paroxysmal atrial fibrillation ([place])    a. PVI 10/13 b. PVI 12/15  . Superficial nodular basal cell carcinoma (BCC) 06/13/2019   Above Left Brow (curet and 5FU)  . Superficial nodular basal cell carcinoma (BCC) 06/13/2019   Right Cheekbone (curet and 5FU)   Past Surgical History:  Procedure Laterality Date  . ATRIAL FIBRILLATION ABLATION N/A 06/07/2012   PVI and CTI ablation by Dr Rayann Heman  . ATRIAL FIBRILLATION ABLATION N/A 07/31/2014   PVI by Dr Rayann Heman; initial ablation 2013  . ATRIAL FIBRILLATION  ABLATION N/A 05/21/2020   Procedure: ATRIAL FIBRILLATION ABLATION;  Surgeon: Thompson Grayer, MD;  Location: [facility];  Service: Cardiovascular;  Laterality: N/A;  . CARDIOVERSION  05/06/2012   Procedure: CARDIOVERSION;  Surgeon: Sueanne Margarita, MD;  Location: [facility];  Service: Cardiovascular;  Laterality: N/A;  h/p in file drawer  . CARDIOVERSION N/A 10/06/2017   Procedure: CARDIOVERSION;  Surgeon: Skeet Latch, MD;  Location: [facility] ENDOSCOPY;  Service: Cardiovascular;  Laterality: N/A;  . CARDIOVERSION N/A 03/28/2018   Procedure: CARDIOVERSION;  Surgeon: Dorothy Spark, MD;  Location: [facility] ENDOSCOPY;  Service: Cardiovascular;  Laterality: N/A;  . CARDIOVERSION N/A 02/09/2019   Procedure: CARDIOVERSION;  Surgeon: Pixie Casino, MD;  Location: [place];  Service: Cardiovascular;  Laterality: N/A;  . CESAREAN SECTION  1978-1979   x-2  . CHOLECYSTECTOMY  1989  . ELECTROPHYSIOLOGIC STUDY N/A 09/08/2016   Procedure: Cardioversion;  Surgeon: Thompson Grayer, MD;  Location: [place] [facility];  Service: Cardiovascular;  Laterality: N/A;  . EP IMPLANTABLE DEVICE N/A 09/08/2016   Procedure: Loop Recorder Removal;  Surgeon: Thompson Grayer, MD;  Location: [place] [facility];  Service: Cardiovascular;  Laterality: N/A;  . EP IMPLANTABLE DEVICE N/A 09/08/2016   Procedure: Pacemaker Implant;  Surgeon: Thompson Grayer, MD;  Location: [place] [facility];  Service: Cardiovascular;  Laterality: N/A;  . Implantable loop recorder implantation  05/30/14   Medtronic Reveal LINQ implanted by Dr Rayann Heman with RIO II protocol (ambulatory setting)  . TEE  WITHOUT CARDIOVERSION  06/06/2012   Procedure: TRANSESOPHAGEAL ECHOCARDIOGRAM (TEE);  Surgeon: Jettie Booze, MD;  Location: [facility];  Service: Cardiovascular;  Laterality: N/A;  . TEE WITHOUT CARDIOVERSION N/A 07/30/2014   Procedure: TRANSESOPHAGEAL ECHOCARDIOGRAM (TEE);  Surgeon: Larey Dresser, MD;  Location: [facility];  Service:  Cardiovascular;  Laterality: N/A;  . TEE WITHOUT CARDIOVERSION N/A 10/06/2017   Procedure: TRANSESOPHAGEAL ECHOCARDIOGRAM (TEE);  Surgeon: Skeet Latch, MD;  Location: [place];  Service: Cardiovascular;  Laterality: N/A;  . TEE WITHOUT CARDIOVERSION N/A 03/28/2018   Procedure: TRANSESOPHAGEAL ECHOCARDIOGRAM (TEE);  Surgeon: Dorothy Spark, MD;  Location: [facility] ENDOSCOPY;  Service: Cardiovascular;  Laterality: N/A;  . TEE WITHOUT CARDIOVERSION N/A 09/30/2018   Procedure: TRANSESOPHAGEAL ECHOCARDIOGRAM (TEE);  Surgeon: Elouise Munroe, MD;  Location: [facility] ENDOSCOPY;  Service: Cardiovascular;  Laterality: N/A;  . TEE WITHOUT CARDIOVERSION N/A 02/09/2019   Procedure: TRANSESOPHAGEAL ECHOCARDIOGRAM (TEE);  Surgeon: Pixie Casino, MD;  Location: [facility] ENDOSCOPY;  Service: Cardiovascular;  Laterality: N/A;    Current Outpatient Medications  Medication Sig Dispense Refill  . acetaminophen (TYLENOL) 500 MG tablet Take 250-500 mg by mouth daily as needed for moderate pain.     Marland Kitchen alum & mag hydroxide-simeth (MAALOX/MYLANTA) 200-200-20 MG/5ML suspension Take 15 mLs by mouth every 6 (six) hours as needed for indigestion or heartburn.    . calcium carbonate (OS-CAL) 600 MG TABS tablet Take 600 mg by mouth 2 (two) times daily with a meal.     . Cholecalciferol (VITAMIN D) 50 MCG (2000 UT) tablet Take 2,000 Units by mouth every evening.     . metoprolol tartrate (LOPRESSOR) 50 MG tablet TAKE 1 AND 1/2 TABLETS BY MOUTH TWICE DAILY 135 tablet 1  . pantoprazole (PROTONIX) 40 MG tablet Take 40 mg by mouth daily before breakfast.     . Polyethyl Glycol-Propyl Glycol (SYSTANE ULTRA PF OP) Place 1 drop into both eyes daily as needed (dry eye).     . rivaroxaban (XARELTO) 20 MG TABS tablet Take 1 tablet (20 mg total) by mouth daily with supper. 90 tablet 1  . rosuvastatin (CRESTOR) 20 MG tablet TAKE ONE TABLET BY MOUTH ONCE DAILY 90 tablet 2  . sotalol (BETAPACE) 80 MG tablet TAKE ONE TABLET BY MOUTH TWICE  DAILY 180 tablet 1  . traZODone (DESYREL) 50 MG tablet Take 50-75 mg by mouth at bedtime.      No current facility-administered medications for this encounter.    Allergies  Allergen Reactions  . Morphine And Related Nausea And Vomiting  . Codeine Nausea And Vomiting    Social History   Socioeconomic History  . Marital status: Widowed    Spouse name: Not on file  . Number of children: Not on file  . Years of education: Not on file  . Highest education level: Not on file  Occupational History  . Not on file  Tobacco Use  . Smoking status: Never Smoker  . Smokeless tobacco: Never Used  Substance and Sexual Activity  . Alcohol use: No  . Drug use: No  . Sexual activity: Not on file  Other Topics Concern  . 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   [place]   Marital Status:single,widowed,2006.   Social Determinants of Health   Financial Resource Strain:   . Difficulty of Paying Living Expenses: Not on file  Food Insecurity:   . Worried About Charity fundraiser in the Last Year: Not on  file  . [place] in the Last Year: Not on file  Transportation Needs:   . Lack of Transportation (Medical): Not on file  . Lack of Transportation (Non-Medical): Not on file  Physical Activity:   . Days of Exercise per Week: Not on file  . Minutes of Exercise per Session: Not on file  Stress:   . Feeling of Stress : Not on file  Social Connections:   . Frequency of Communication with Friends and Family: Not on file  . Frequency of Social Gatherings with Friends and Family: Not on file  . Attends Religious Services: Not on file  . Active Member of Clubs or Organizations: Not on file  . Attends Archivist Meetings: Not on file  . Marital Status: Not on file  Intimate Partner Violence:   . Fear of Current or Ex-Partner: Not on file  . Emotionally Abused: Not on file  . Physically  Abused: Not on file  . Sexually Abused: Not on file    Family History  Problem Relation Age of Onset  . Heart attack Father   . CAD Father   . Hip fracture Mother        complication  . Lung cancer Brother   . Heart attack Brother        massive  . Other Brother        mva  . Thyroid cancer Sister   . Atrial fibrillation Sister   . Ovarian cancer Sister        surviver    ROS- All systems are reviewed and negative except as per the HPI above  Physical Exam: Vitals:   06/18/20 1145  BP: (!) 150/80  Pulse: 64  Weight: 67.3 kg  Height: 5\' 3"  (1.6 m)   Wt Readings from Last 3 Encounters:  06/18/20 67.3 kg  05/21/20 67.6 kg  09/22/19 68.8 kg    Labs: Lab Results  Component Value Date   NA 138 04/24/2020   K 4.9 04/24/2020   CL 100 04/24/2020   CO2 25 04/24/2020   GLUCOSE 79 04/24/2020   BUN 13 04/24/2020   CREATININE 0.91 04/24/2020   CALCIUM 10.1 04/24/2020   MG 2.1 03/31/2019   Lab Results  Component Value Date   INR 4.0 (A) 04/24/2020   Lab Results  Component Value Date   CHOL 122 09/26/2019   HDL 58 09/26/2019   LDLCALC 46 09/26/2019   TRIG 93 09/26/2019     GEN- The patient is well appearing, alert and oriented x 3 today.   Head- normocephalic, atraumatic Eyes-  Sclera clear, conjunctiva pink Ears- hearing intact Oropharynx- clear Neck- supple, no JVP Lymph- no cervical lymphadenopathy Lungs- Clear to ausculation bilaterally, normal work of breathing Heart- Regular rate and rhythm, no murmurs, rubs or gallops, PMI not laterally displaced GI- soft, NT, ND, + BS Extremities- no clubbing, cyanosis, or edema MS- no significant deformity or atrophy Skin- no rash or lesion Psych- euthymic mood, full affect Neuro- strength and sensation are intact  EKG-a paced rhythm at 64 bpm, pr int 282 ms, qrs int 72 ms, qtc 443 ms    Assessment and Plan: 1. afib S/p 3rd ablation one month ago Doing well maintaining  SR Continue  sotalol 80 mg bid and  metoprolol tartrate 37.5 mg bid   2. CHA2DS2VASc of 4 Continue  xarelto 20 mg daily Reminded not to interrupt anticoagulation  for the full 3 month recovery period   3. Htn  Stable   F/u with Dr. Rayann Heman 08/20/19  Geroge Baseman. Caramia Boutin, [facility] [address] [place], [place] [address] [phone]

## 2020-06-20 ENCOUNTER — Encounter: Payer: Self-pay | Admitting: Cardiology

## 2020-06-20 ENCOUNTER — Other Ambulatory Visit: Payer: Self-pay

## 2020-06-20 ENCOUNTER — Ambulatory Visit: Payer: Medicare Other | Admitting: Cardiology

## 2020-06-20 VITALS — BP 138/88 | HR 73 | Ht 63.0 in | Wt 150.0 lb

## 2020-06-20 DIAGNOSIS — I2510 Atherosclerotic heart disease of native coronary artery without angina pectoris: Secondary | ICD-10-CM | POA: Diagnosis not present

## 2020-06-20 DIAGNOSIS — I2583 Coronary atherosclerosis due to lipid rich plaque: Secondary | ICD-10-CM

## 2020-06-20 DIAGNOSIS — I10 Essential (primary) hypertension: Secondary | ICD-10-CM

## 2020-06-20 DIAGNOSIS — E7800 Pure hypercholesterolemia, unspecified: Secondary | ICD-10-CM | POA: Diagnosis not present

## 2020-06-20 DIAGNOSIS — I480 Paroxysmal atrial fibrillation: Secondary | ICD-10-CM

## 2020-06-20 NOTE — Progress Notes (Signed)
Cardiology Office Note:    Date:  06/20/2020   ID:  Monica [name], DOB 1940-11-26, MRN [id number]  PCP:  Carol Ada, MD  Cardiologist:  Fransico Him, MD    Referring MD: Carol Ada, MD   Chief Complaint  Patient presents with  . Coronary Artery Disease  . Hypertension  . Atrial Fibrillation  . Hyperlipidemia    History of Present Illness:    Monica [name] is a 79 y.o. female with a hx of PAD s/p ablation x2 and PPM implant 08/2016. She was seen in the atrial fibrillation clinic 10/04/2018 and was in atrial fibrillation. On 09/30/2018 she had TEE guided cardioversion.  Unfortunately TEE showed a small primarily fixed left atrial thrombus on the anterior of the left atrial appendage and cardioversion was not performed.  Coumadin clinic was aware and increased her INR to 2.5-3.5 goal. Plan was to leave her in atrial fibrillation until the clot issue resolved.  Beta-blocker was increased to slow her down.  Cardiac CT was done to rule out CAD and showed calcium score of 34 which is 75 percentile for age and sex.  Upper normal aortic size 3.7 cm.  Complex mixed plaque in the ostial LAD involving D1 takeoff 50 to 75% stenosis.  Study was not suitable for FFR due to Misregistration artifact.  She underwent Lexican myoview showing no ischemia.    She underwent TEE/DCCV to NSR 01/2019.  She was seen back in afib clinic and was maintaining NSR.  Sotolol and Metoprolol were continued.  Unfortunately she had a reocurrence of afib and underwent successful afib ablation last month and has been doing well.    She is here today for followup and is doing well.  She denies any chest pain or pressure, SOB, DOE, PND, orthopnea, LE edema, dizziness,  or syncope. She has noticed a few skipped heart beats since her ablation but no afib.  She is compliant with her meds and is tolerating meds with no SE.    Past Medical History:  Diagnosis Date  . Adrenal adenoma 6/11   lt on ct 6/11  . Atrial  fibrillation ([place]) 01/21/2011  . Atrial flutter ([place]) 05/16/2012  . BCC (basal cell carcinoma of skin) 05/22/2013   Right Upper Lip (curet, cautery, and excision)  . BCC (basal cell carcinoma of skin) 08/24/2013   Right Upper Lip(Sclerosis + margin) (MOH's)  . Bradycardia 01/21/2011  . Coronary artery calcification seen on CT scan 11/01/2018  . GERD (gastroesophageal reflux disease)   . Hypercholesteremia   . Hypertension   . Kidney stone 6/11   rt. on ct 6/11  . 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 Osteoporosis   . Palpitations 05/30/2014  . Paroxysmal atrial fibrillation ([place])    a. PVI 10/13 b. PVI 12/15  . Superficial nodular basal cell carcinoma (BCC) 06/13/2019   Above Left Brow (curet and 5FU)  . Superficial nodular basal cell carcinoma (BCC) 06/13/2019   Right Cheekbone (curet and 5FU)    Past Surgical History:  Procedure Laterality Date  . ATRIAL FIBRILLATION ABLATION N/A 06/07/2012   PVI and CTI ablation by Dr Rayann Heman  . ATRIAL FIBRILLATION ABLATION N/A 07/31/2014   PVI by Dr Rayann Heman; initial ablation 2013  . ATRIAL FIBRILLATION ABLATION N/A 05/21/2020   Procedure: ATRIAL FIBRILLATION ABLATION;  Surgeon: Thompson Grayer, MD;  Location: [place] [facility];  Service: Cardiovascular;  Laterality: N/A;  . CARDIOVERSION  05/06/2012  Procedure: CARDIOVERSION;  Surgeon: Sueanne Margarita, MD;  Location: [facility] ENDOSCOPY;  Service: Cardiovascular;  Laterality: N/A;  h/p in file drawer  . CARDIOVERSION N/A 10/06/2017   Procedure: CARDIOVERSION;  Surgeon: Skeet Latch, MD;  Location: [facility] ENDOSCOPY;  Service: Cardiovascular;  Laterality: N/A;  . CARDIOVERSION N/A 03/28/2018   Procedure: CARDIOVERSION;  Surgeon: Dorothy Spark, MD;  Location: [facility] ENDOSCOPY;  Service: Cardiovascular;  Laterality: N/A;  . CARDIOVERSION N/A 02/09/2019   Procedure: CARDIOVERSION;  Surgeon: Pixie Casino, MD;  Location: [place];  Service:  Cardiovascular;  Laterality: N/A;  . CESAREAN SECTION  1978-1979   x-2  . CHOLECYSTECTOMY  1989  . ELECTROPHYSIOLOGIC STUDY N/A 09/08/2016   Procedure: Cardioversion;  Surgeon: Thompson Grayer, MD;  Location: [place] CV LAB;  Service: Cardiovascular;  Laterality: N/A;  . EP IMPLANTABLE DEVICE N/A 09/08/2016   Procedure: Loop Recorder Removal;  Surgeon: Thompson Grayer, MD;  Location: [place] CV LAB;  Service: Cardiovascular;  Laterality: N/A;  . EP IMPLANTABLE DEVICE N/A 09/08/2016   Procedure: Pacemaker Implant;  Surgeon: Thompson Grayer, MD;  Location: [place] CV LAB;  Service: Cardiovascular;  Laterality: N/A;  . Implantable loop recorder implantation  05/30/14   Medtronic Reveal LINQ implanted by Dr Rayann Heman with RIO II protocol (ambulatory setting)  . TEE WITHOUT CARDIOVERSION  06/06/2012   Procedure: TRANSESOPHAGEAL ECHOCARDIOGRAM (TEE);  Surgeon: Jettie Booze, MD;  Location: [place];  Service: Cardiovascular;  Laterality: N/A;  . TEE WITHOUT CARDIOVERSION N/A 07/30/2014   Procedure: TRANSESOPHAGEAL ECHOCARDIOGRAM (TEE);  Surgeon: Larey Dresser, MD;  Location: [place];  Service: Cardiovascular;  Laterality: N/A;  . TEE WITHOUT CARDIOVERSION N/A 10/06/2017   Procedure: TRANSESOPHAGEAL ECHOCARDIOGRAM (TEE);  Surgeon: Skeet Latch, MD;  Location: [place];  Service: Cardiovascular;  Laterality: N/A;  . TEE WITHOUT CARDIOVERSION N/A 03/28/2018   Procedure: TRANSESOPHAGEAL ECHOCARDIOGRAM (TEE);  Surgeon: Dorothy Spark, MD;  Location: [facility] ENDOSCOPY;  Service: Cardiovascular;  Laterality: N/A;  . TEE WITHOUT CARDIOVERSION N/A 09/30/2018   Procedure: TRANSESOPHAGEAL ECHOCARDIOGRAM (TEE);  Surgeon: Elouise Munroe, MD;  Location: [facility] ENDOSCOPY;  Service: Cardiovascular;  Laterality: N/A;  . TEE WITHOUT CARDIOVERSION N/A 02/09/2019   Procedure: TRANSESOPHAGEAL ECHOCARDIOGRAM (TEE);  Surgeon: Pixie Casino, MD;  Location: [facility] ENDOSCOPY;  Service: Cardiovascular;   Laterality: N/A;    Current Medications: Current Meds  Medication Sig  . acetaminophen (TYLENOL) 500 MG tablet Take 250-500 mg by mouth daily as needed for moderate pain.   Marland Kitchen alum & mag hydroxide-simeth (MAALOX/MYLANTA) 200-200-20 MG/5ML suspension Take 15 mLs by mouth every 6 (six) hours as needed for indigestion or heartburn.  . calcium carbonate (OS-CAL) 600 MG TABS tablet Take 600 mg by mouth 2 (two) times daily with a meal.   . Cholecalciferol (VITAMIN D) 50 MCG (2000 UT) tablet Take 2,000 Units by mouth every evening.   . metoprolol tartrate (LOPRESSOR) 50 MG tablet TAKE 1 AND 1/2 TABLETS BY MOUTH TWICE DAILY  . pantoprazole (PROTONIX) 40 MG tablet Take 40 mg by mouth daily before breakfast.   . Polyethyl Glycol-Propyl Glycol (SYSTANE ULTRA PF OP) Place 1 drop into both eyes daily as needed (dry eye).   . rivaroxaban (XARELTO) 20 MG TABS tablet Take 1 tablet (20 mg total) by mouth daily with supper.  . rosuvastatin (CRESTOR) 20 MG tablet TAKE ONE TABLET BY MOUTH ONCE DAILY  . sotalol (BETAPACE) 80 MG tablet TAKE ONE TABLET BY MOUTH TWICE DAILY  . traZODone (DESYREL) 50 MG tablet Take 50-75 mg  by mouth at bedtime.      Allergies:   Morphine and related and Codeine   Social History   Socioeconomic History  . Marital status: Widowed    Spouse name: Not on file  . Number of children: Not on file  . Years of education: Not on file  . Highest education level: Not on file  Occupational History  . Not on file  Tobacco Use  . Smoking status: Never Smoker  . Smokeless tobacco: Never Used  Substance and Sexual Activity  . Alcohol use: No  . Drug use: No  . Sexual activity: Not on file  Other Topics Concern  . 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   [place]   Marital Status:single,widowed,2006.   Social Determinants of Health   Financial Resource Strain:   . Difficulty of Paying Living  Expenses: Not on file  Food Insecurity:   . Worried About Charity fundraiser in the Last Year: Not on file  . Ran Out of Food in the Last Year: Not on file  Transportation Needs:   . Lack of Transportation (Medical): Not on file  . Lack of Transportation (Non-Medical): Not on file  Physical Activity:   . Days of Exercise per Week: Not on file  . Minutes of Exercise per Session: Not on file  Stress:   . Feeling of Stress : Not on file  Social Connections:   . Frequency of Communication with Friends and Family: Not on file  . Frequency of Social Gatherings with Friends and Family: Not on file  . Attends Religious Services: Not on file  . Active Member of Clubs or Organizations: Not on file  . Attends Archivist Meetings: Not on file  . Marital Status: Not on file     Family History: The patient's family history includes Atrial fibrillation in her sister; CAD in her father; Heart attack in her brother and father; Hip fracture in her mother; Lung cancer in her brother; Other in her brother; Ovarian cancer in her sister; Thyroid cancer in her sister.  ROS:   Please see the history of present illness.    ROS  All other systems reviewed and negative.   EKGs/Labs/Other Studies Reviewed:    The following studies were reviewed today:   EKG:  EKG is not ordered today.   Recent Labs: 09/26/2019: ALT 22 04/24/2020: BUN 13; Creatinine, Ser 0.91; Hemoglobin 12.9; Platelets 250; Potassium 4.9; Sodium 138   Recent Lipid Panel    Component Value Date/Time   CHOL 122 09/26/2019 1027   TRIG 93 09/26/2019 1027   HDL 58 09/26/2019 1027   CHOLHDL 2.1 09/26/2019 1027   LDLCALC 46 09/26/2019 1027    Physical Exam:    VS:  BP 138/88   Pulse 73   Ht 5\' 3"  (1.6 m)   Wt 150 lb (68 kg)   SpO2 98%   BMI 26.57 kg/m     Wt Readings from Last 3 Encounters:  06/20/20 150 lb (68 kg)  06/18/20 148 lb 6.4 oz (67.3 kg)  05/21/20 149 lb (67.6 kg)     GEN:  Well nourished, well  developed in no acute distress HEENT: Normal NECK: No JVD; No carotid bruits LYMPHATICS: No lymphadenopathy CARDIAC: RRR, no murmurs, rubs, gallops RESPIRATORY:  Clear to auscultation without rales, wheezing or rhonchi  ABDOMEN: Soft, non-tender, non-distended MUSCULOSKELETAL:  No edema; No deformity  SKIN: Warm and dry  NEUROLOGIC:  Alert and oriented x 3 PSYCHIATRIC:  Normal affect   ASSESSMENT:    1. Paroxysmal atrial fibrillation (HCC)   2. Coronary artery disease due to lipid rich plaque   3. Hypertension, benign   4. Pure hypercholesterolemia    PLAN:    In order of problems listed above:  1.  PAF -s/p TEE/DCCV after increasing INR goal for prior LAA thrombus that resolved by TEE 01/2019. -now s/p 3rd ablation for afib last month -she is maintaining NSR on exam today with very few palpitations -denies any bleeding problems on Xarelto -continue Sotolol 80mg  BID, Metoprolol 75mg  BID and Xarelto -SCr stabel at 0.97 and K+ 5.3 in Sept 21  2.  ASCAD  -Coronary CTA showed complex mixed plaque in the ostial LAD involving D1 takeoff 50 to 75% stenosis.  Study was not suitable for FFR due to Misregistration artifact.  -Lexiscan myoview showed no ischemia -she continues to be asymptomatic with no anginal symptoms -no ASA due to DOAC -continue BB and statin  3.  HTN -Bp is well controlled on exam -continue Lopressor 75mg  BID  4.  HLD -LDL goal < 70 -LDL 51 in Sept 2021 -continue Pravastatin 20mg  daily   Medication Adjustments/Labs and Tests Ordered: Current medicines are reviewed at length with the patient today.  Concerns regarding medicines are outlined above.  No orders of the defined types were placed in this encounter.  No orders of the defined types were placed in this encounter.   Signed, Fransico Him, MD  06/20/2020 2:36 PM    [place]

## 2020-06-20 NOTE — Patient Instructions (Signed)
Medication Instructions:  Your physician recommends that you continue on your current medications as directed. Please refer to the Current Medication list given to you today.  *If you need a refill on your cardiac medications before your next appointment, please call your pharmacy*   Lab Work: None If you have labs (blood work) drawn today and your tests are completely normal, you will receive your results only by: Marland Kitchen MyChart Message (if you have MyChart) OR . A paper copy in the mail If you have any lab test that is abnormal or we need to change your treatment, we will call you to review the results.   Testing/Procedures: None   Follow-Up: At [facility], you and your health needs are our priority.  As part of our continuing mission to provide you with exceptional heart care, we have created designated Provider Care Teams.  These Care Teams include your primary Cardiologist (physician) and Advanced Practice Providers (APPs -  Physician Assistants and Nurse Practitioners) who all work together to provide you with the care you need, when you need it.  We recommend signing up for the patient portal called "MyChart".  Sign up information is provided on this After Visit Summary.  MyChart is used to connect with patients for Virtual Visits (Telemedicine).  Patients are able to view lab/test results, encounter notes, upcoming appointments, etc.  Non-urgent messages can be sent to your provider as well.   To learn more about what you can do with MyChart, go to [email or web address].    Your next appointment:   12 month(s)  The format for your next appointment:   In Person  Provider:   You may see Fransico Him, MD or one of the following Advanced Practice Providers on your designated Care Team:    Melina Copa, PA-C  Ermalinda Barrios, PA-C    Other Instructions

## 2020-07-21 IMAGING — CT CT VIRTUAL COLONOSCOPY DIAGNOSTIC
2 of 9 series · 12 of 46 positions shown, 18 images · non-contrast
Comparison: CT 05/11/2011

CLINICAL DATA: Positive cologuard test

EXAM:
CT VIRTUAL COLONOSCOPY DIAGNOSTIC
TECHNIQUE: The patient was given a standard bowel preparation with Gastrografin
and barium for fluid and stool tagging respectively. The quality of
the bowel preparation is moderate. Automated CO2 insufflation of the
colon was performed prior to image acquisition and colonic
distention is moderate. Image post processing was used to generate a
3D endoluminal fly-through projection of the colon and to
electronically subtract stool/fluid as appropriate.

[Series 7: supine colon 3.00 br40 s3 cor cor supine · coronal · 0.83mm/px · 3 of 92 slices shown]
[im 23/92  soft-tissue]
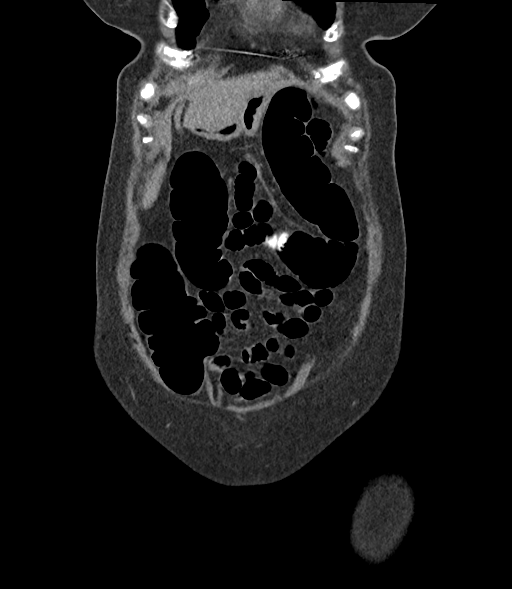
[im 46/92  soft-tissue]
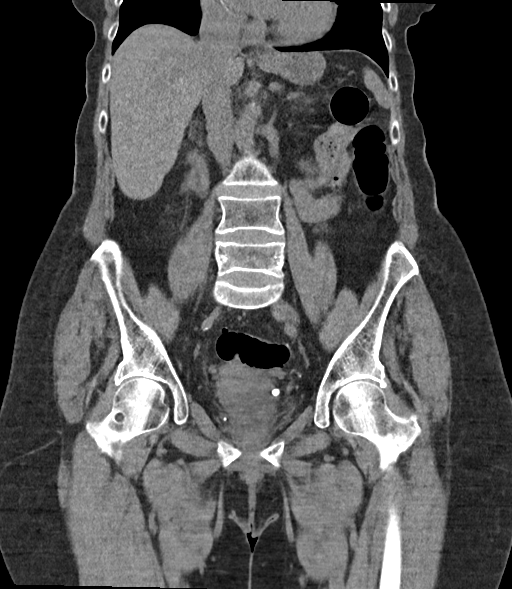
[im 69/92  soft-tissue]
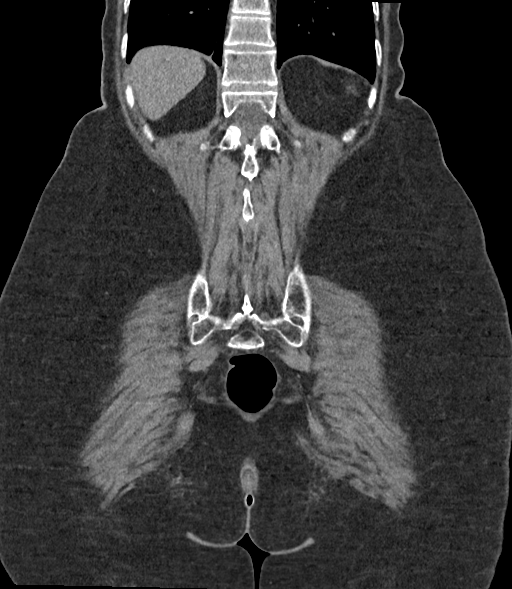

[Series 12: prone colon 1.50 br40 s3 prone thin · axial · 0.85mm/px · z∈[+1193,+1594]mm · 9 of 335 slices shown, 15 images]
[im 34/335  soft-tissue]
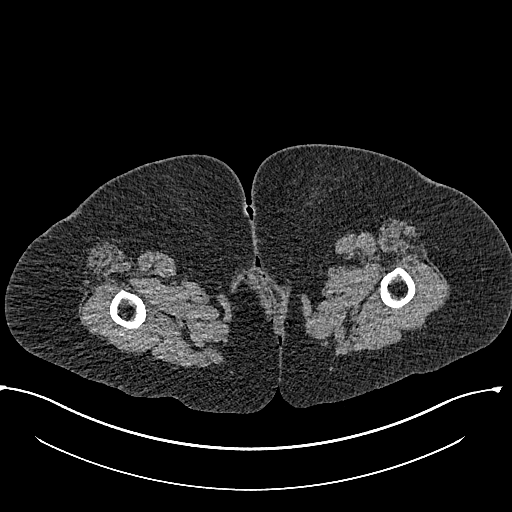
[im 34/335  bone]
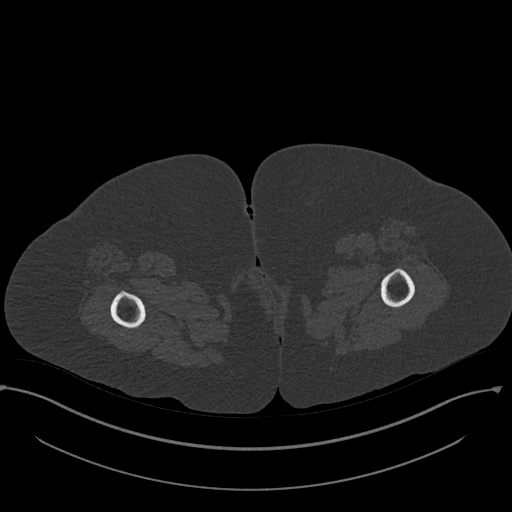
[im 67/335  soft-tissue]
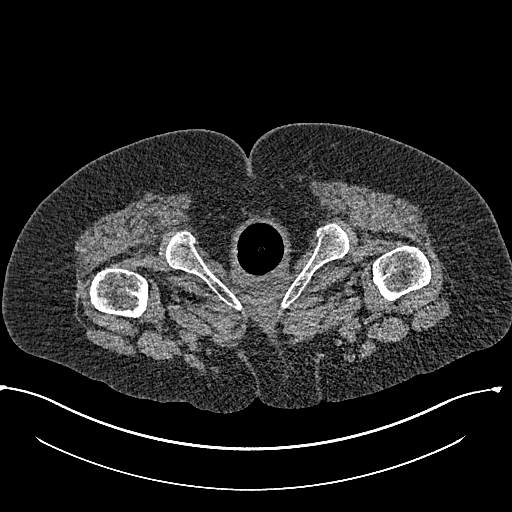
[im 101/335  soft-tissue]
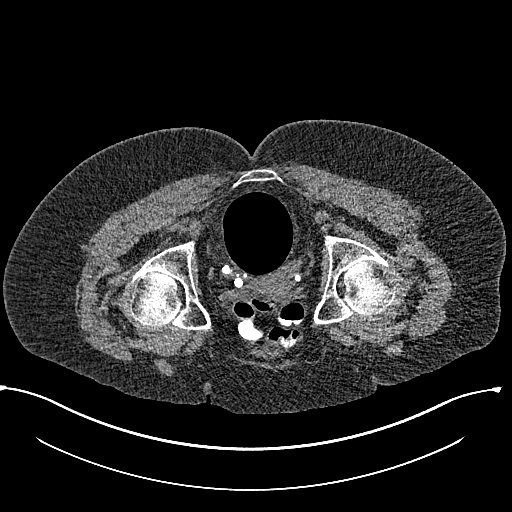
[im 134/335  soft-tissue]
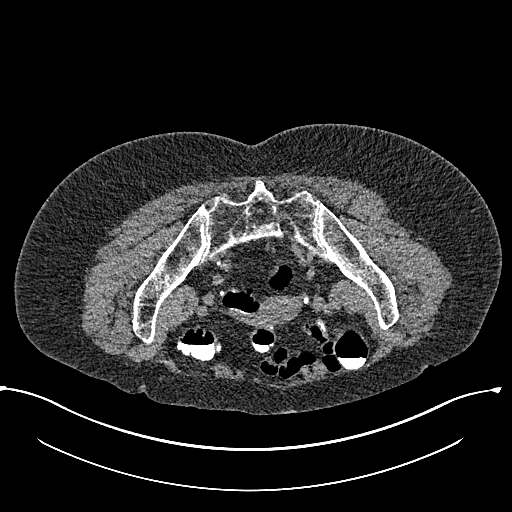
[im 168/335  soft-tissue]
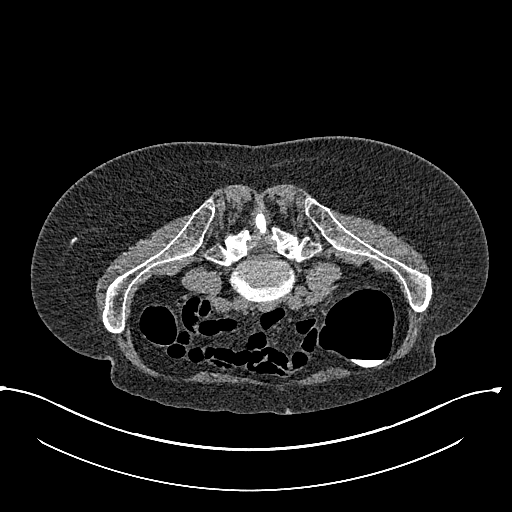
[im 201/335  soft-tissue]
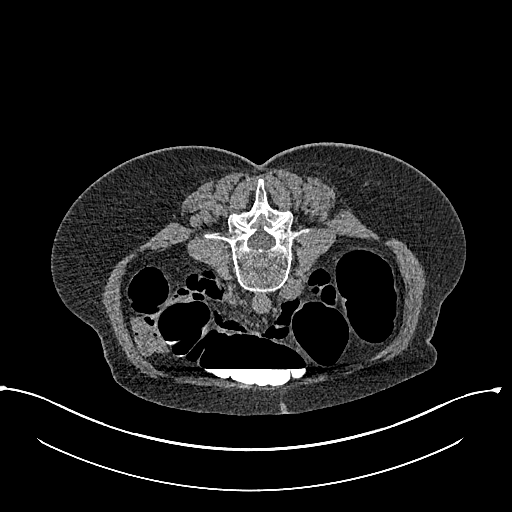
[im 201/335  lung]
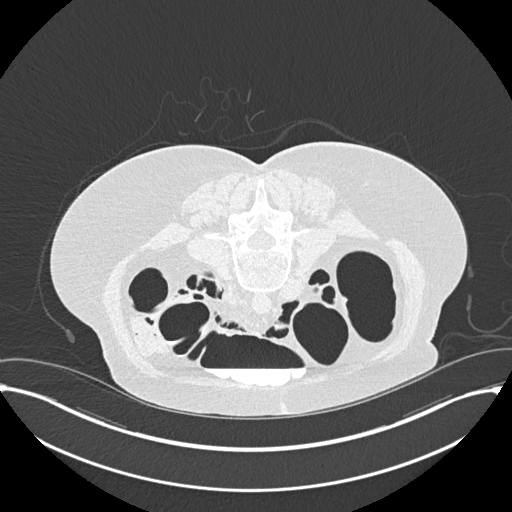
[im 234/335  soft-tissue]
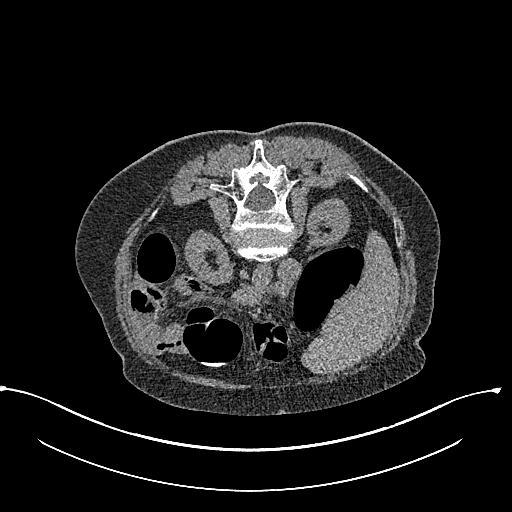
[im 234/335  lung]
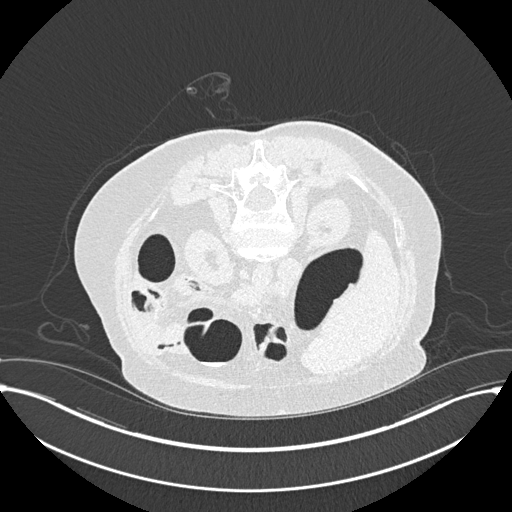
[im 268/335  soft-tissue]
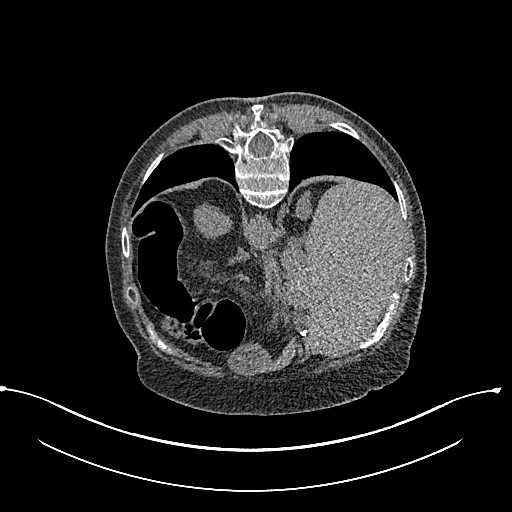
[im 268/335  lung]
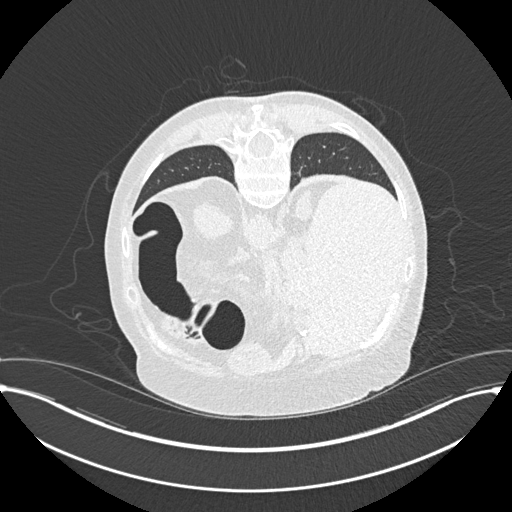
[im 301/335  soft-tissue]
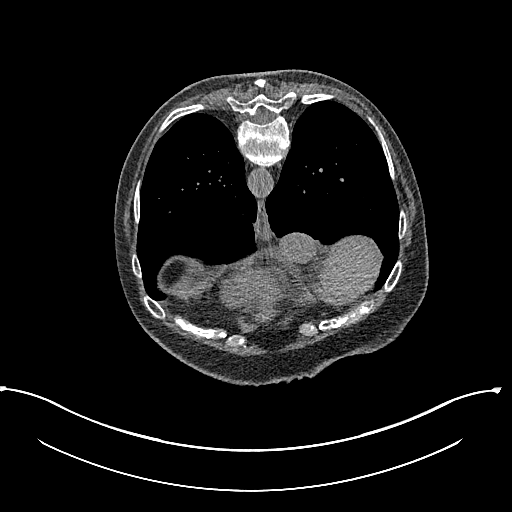
[im 301/335  lung]
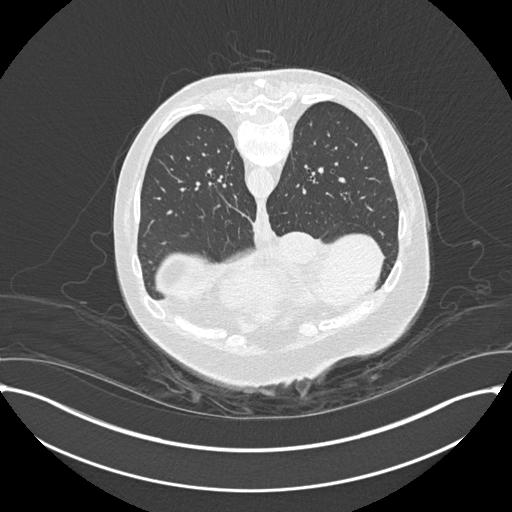
[im 301/335  bone]
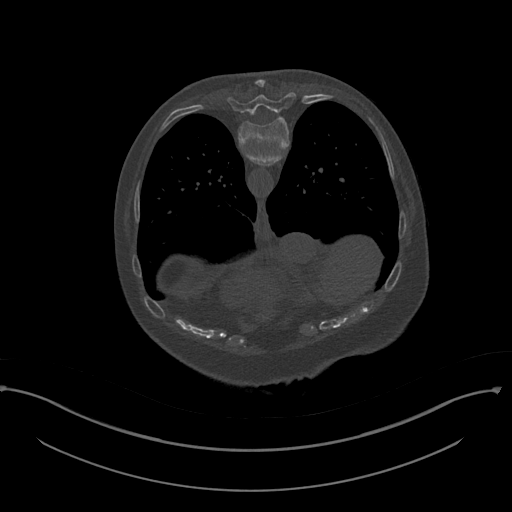

[12 of 46 positions shown; findings below may reference images not displayed]

FINDINGS: VIRTUAL COLONOSCOPY

There is moderate diverticulosis within the sigmoid colon. Moderate
retained layering barium within the colon. No fixed polypoid filling
defect or annular constricting lesion.

Virtual colonoscopy is not designed to detect diminutive polyps
(i.e., less than or equal to 5 mm), the presence or absence of which
may not affect clinical management.

CT ABDOMEN AND PELVIS WITHOUT CONTRAST

Lower chest: Mild cardiomegaly. Pacer wires in the right heart. No
acute abnormality.

Hepatobiliary: No focal liver abnormality is seen. Status post
cholecystectomy. No biliary dilatation.

Pancreas: Fatty replacement/atrophy. No focal abnormality or ductal
dilatation.

Spleen: No focal abnormality.  Normal size.

Adrenals/Urinary Tract: Punctate nonobstructing stone in the upper
pole of the right kidney. No renal or adrenal mass or
hydronephrosis. Urinary bladder decompressed, grossly unremarkable.

Stomach/Bowel: Appendix is normal. Stomach and small bowel
decompressed, grossly unremarkable.

Vascular/Lymphatic: No evidence of aneurysm or adenopathy. Scattered
aortic and iliac calcifications.

Reproductive: Uterus and adnexa unremarkable.  No mass.

Other: No free fluid or free air.

Musculoskeletal: No acute bony abnormality.
IMPRESSION: Sigmoid diverticulosis. No fixed polypoid filling defects or annular
constricting lesions.

No acute extra colonic abnormality.

## 2020-08-19 ENCOUNTER — Other Ambulatory Visit: Payer: Self-pay

## 2020-08-19 ENCOUNTER — Encounter: Payer: Self-pay | Admitting: Internal Medicine

## 2020-08-19 ENCOUNTER — Ambulatory Visit: Payer: Medicare Other | Admitting: Internal Medicine

## 2020-08-19 VITALS — BP 160/94 | HR 77 | Ht 63.0 in | Wt 152.2 lb

## 2020-08-19 DIAGNOSIS — D6869 Other thrombophilia: Secondary | ICD-10-CM | POA: Diagnosis not present

## 2020-08-19 DIAGNOSIS — R001 Bradycardia, unspecified: Secondary | ICD-10-CM | POA: Diagnosis not present

## 2020-08-19 DIAGNOSIS — I10 Essential (primary) hypertension: Secondary | ICD-10-CM

## 2020-08-19 DIAGNOSIS — I480 Paroxysmal atrial fibrillation: Secondary | ICD-10-CM | POA: Diagnosis not present

## 2020-08-19 MED ORDER — AMLODIPINE BESYLATE 2.5 MG PO TABS: 2.5000 mg | ORAL_TABLET | Freq: Every day | ORAL | 3 refills | Status: DC

## 2020-08-19 NOTE — Progress Notes (Signed)
PCP: Carol Ada, MD Primary Cardiologist: Dr Verdie Shire is a 80 y.o. female who presents today for routine electrophysiology followup.  Since his recent afib ablation, the patient reports doing very well.  she denies procedure related complications and is pleased with the results of the procedure.  She had some afib between 12/22 and 12/31 but has converted to sinus.   Today, she denies symptoms of palpitations, chest pain, shortness of breath,  lower extremity edema, dizziness, presyncope, or syncope.  The patient is otherwise without complaint today.   Past Medical History:  Diagnosis Date  . Adrenal adenoma 6/11   lt on ct 6/11  . Atrial fibrillation ([place]) 01/21/2011  . Atrial flutter ([place]) 05/16/2012  . BCC (basal cell carcinoma of skin) 05/22/2013   Right Upper Lip (curet, cautery, and excision)  . BCC (basal cell carcinoma of skin) 08/24/2013   Right Upper Lip(Sclerosis + margin) (MOH's)  . Bradycardia 01/21/2011  . Coronary artery calcification seen on CT scan 11/01/2018  . GERD (gastroesophageal reflux disease)   . Hypercholesteremia   . Hypertension   . Kidney stone 6/11   rt. on ct 6/11  . 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 Osteoporosis   . Palpitations 05/30/2014  . Paroxysmal atrial fibrillation ([place])    a. PVI 10/13 b. PVI 12/15  . Superficial nodular basal cell carcinoma (BCC) 06/13/2019   Above Left Brow (curet and 5FU)  . Superficial nodular basal cell carcinoma (BCC) 06/13/2019   Right Cheekbone (curet and 5FU)   Past Surgical History:  Procedure Laterality Date  . ATRIAL FIBRILLATION ABLATION N/A 06/07/2012   PVI and CTI ablation by Dr Rayann Heman  . ATRIAL FIBRILLATION ABLATION N/A 07/31/2014   PVI by Dr Rayann Heman; initial ablation 2013  . ATRIAL FIBRILLATION ABLATION N/A 05/21/2020   Procedure: ATRIAL FIBRILLATION ABLATION;  Surgeon: Thompson Grayer, MD;  Location: [place] CV LAB;  Service:  Cardiovascular;  Laterality: N/A;  . CARDIOVERSION  05/06/2012   Procedure: CARDIOVERSION;  Surgeon: Sueanne Margarita, MD;  Location: [facility] ENDOSCOPY;  Service: Cardiovascular;  Laterality: N/A;  h/p in file drawer  . CARDIOVERSION N/A 10/06/2017   Procedure: CARDIOVERSION;  Surgeon: Skeet Latch, MD;  Location: [facility] ENDOSCOPY;  Service: Cardiovascular;  Laterality: N/A;  . CARDIOVERSION N/A 03/28/2018   Procedure: CARDIOVERSION;  Surgeon: Dorothy Spark, MD;  Location: [facility] ENDOSCOPY;  Service: Cardiovascular;  Laterality: N/A;  . CARDIOVERSION N/A 02/09/2019   Procedure: CARDIOVERSION;  Surgeon: Pixie Casino, MD;  Location: [place];  Service: Cardiovascular;  Laterality: N/A;  . CESAREAN SECTION  1978-1979   x-2  . CHOLECYSTECTOMY  1989  . ELECTROPHYSIOLOGIC STUDY N/A 09/08/2016   Procedure: Cardioversion;  Surgeon: Thompson Grayer, MD;  Location: [place] CV LAB;  Service: Cardiovascular;  Laterality: N/A;  . EP IMPLANTABLE DEVICE N/A 09/08/2016   Procedure: Loop Recorder Removal;  Surgeon: Thompson Grayer, MD;  Location: [place] CV LAB;  Service: Cardiovascular;  Laterality: N/A;  . EP IMPLANTABLE DEVICE N/A 09/08/2016   Procedure: Pacemaker Implant;  Surgeon: Thompson Grayer, MD;  Location: [place] CV LAB;  Service: Cardiovascular;  Laterality: N/A;  . Implantable loop recorder implantation  05/30/14   Medtronic Reveal LINQ implanted by Dr Rayann Heman with RIO II protocol (ambulatory setting)  . TEE WITHOUT CARDIOVERSION  06/06/2012   Procedure: TRANSESOPHAGEAL ECHOCARDIOGRAM (TEE);  Surgeon: Jettie Booze, MD;  Location: [place];  Service: Cardiovascular;  Laterality:  N/A;  . TEE WITHOUT CARDIOVERSION N/A 07/30/2014   Procedure: TRANSESOPHAGEAL ECHOCARDIOGRAM (TEE);  Surgeon: Laurey Morale, MD;  Location: [facility] ENDOSCOPY;  Service: Cardiovascular;  Laterality: N/A;  . TEE WITHOUT CARDIOVERSION N/A 10/06/2017   Procedure: TRANSESOPHAGEAL ECHOCARDIOGRAM (TEE);  Surgeon:  Chilton Si, MD;  Location: [facility] ENDOSCOPY;  Service: Cardiovascular;  Laterality: N/A;  . TEE WITHOUT CARDIOVERSION N/A 03/28/2018   Procedure: TRANSESOPHAGEAL ECHOCARDIOGRAM (TEE);  Surgeon: Lars Masson, MD;  Location: [facility] ENDOSCOPY;  Service: Cardiovascular;  Laterality: N/A;  . TEE WITHOUT CARDIOVERSION N/A 09/30/2018   Procedure: TRANSESOPHAGEAL ECHOCARDIOGRAM (TEE);  Surgeon: Parke Poisson, MD;  Location: [facility] ENDOSCOPY;  Service: Cardiovascular;  Laterality: N/A;  . TEE WITHOUT CARDIOVERSION N/A 02/09/2019   Procedure: TRANSESOPHAGEAL ECHOCARDIOGRAM (TEE);  Surgeon: Chrystie Nose, MD;  Location: [facility] ENDOSCOPY;  Service: Cardiovascular;  Laterality: N/A;    ROS- all systems are personally reviewed and negatives except as per HPI above  Current Outpatient Medications  Medication Sig Dispense Refill  . acetaminophen (TYLENOL) 500 MG tablet Take 250-500 mg by mouth daily as needed for moderate pain.     Marland Kitchen alum & mag hydroxide-simeth (MAALOX/MYLANTA) 200-200-20 MG/5ML suspension Take 15 mLs by mouth every 6 (six) hours as needed for indigestion or heartburn.    . calcium carbonate (OS-CAL) 600 MG TABS tablet Take 600 mg by mouth 2 (two) times daily with a meal.     . Cholecalciferol (VITAMIN D) 50 MCG (2000 UT) tablet Take 2,000 Units by mouth every evening.     . metoprolol tartrate (LOPRESSOR) 50 MG tablet TAKE 1 AND 1/2 TABLETS BY MOUTH TWICE DAILY 135 tablet 1  . pantoprazole (PROTONIX) 40 MG tablet Take 40 mg by mouth daily before breakfast.     . Polyethyl Glycol-Propyl Glycol (SYSTANE ULTRA PF OP) Place 1 drop into both eyes daily as needed (dry eye).     . rivaroxaban (XARELTO) 20 MG TABS tablet Take 1 tablet (20 mg total) by mouth daily with supper. 90 tablet 1  . rosuvastatin (CRESTOR) 20 MG tablet TAKE ONE TABLET BY MOUTH ONCE DAILY 90 tablet 2  . sotalol (BETAPACE) 80 MG tablet TAKE ONE TABLET BY MOUTH TWICE DAILY 180 tablet 1  . traZODone (DESYREL) 50 MG tablet Take  50-75 mg by mouth at bedtime.     No current facility-administered medications for this visit.    Physical Exam: Vitals:   08/19/20 1404  BP: (!) 160/94  Pulse: 77  SpO2: 97%  Weight: 152 lb 3.2 oz (69 kg)  Height: 5\' 3"  (1.6 m)    GEN- The patient is well appearing, alert and oriented x 3 today.   Head- normocephalic, atraumatic Eyes-  Sclera clear, conjunctiva pink Ears- hearing intact Oropharynx- clear Lungs- Clear to ausculation bilaterally, normal work of breathing Heart- Regular rate and rhythm, no murmurs, rubs or gallops, PMI not laterally displaced GI- soft, NT, ND, + BS Extremities- no clubbing, cyanosis, or edema  EKG tracing ordered today is personally reviewed and shows atrial paced 77 bpm, PR 300 msec, PVC, QTc 468 msec  Assessment and Plan:  1. Persistent atrial fibrillation Doing well s/p ablation (3rd ablation) afib burden by PPM is 15.6 % (previously 24%) chads2vasc score is 4.  She is on xarelto continue sotalol.  We will follow her closely on this medicine to avoid toxicity  2. HTN Stable No change required today  3. HL Continue statin  4. Sinus bradycardia Normal pacemaker function See paceArt for details No changes  today  Return to see me in 3 months  Thompson Grayer MD, [facility] 08/19/2020 2:19 PM

## 2020-08-19 NOTE — Patient Instructions (Addendum)
Medication Instructions:  Start Amlodipine 2.5 mg daily  *If you need a refill on your cardiac medications before your next appointment, please call your pharmacy*  Lab Work: None ordered.  If you have labs (blood work) drawn today and your tests are completely normal, you will receive your results only by: Marland Kitchen MyChart Message (if you have MyChart) OR . A paper copy in the mail If you have any lab test that is abnormal or we need to change your treatment, we will call you to review the results.  Testing/Procedures: None ordered.  Follow-Up: At [facility], you and your health needs are our priority.  As part of our continuing mission to provide you with exceptional heart care, we have created designated Provider Care Teams.  These Care Teams include your primary Cardiologist (physician) and Advanced Practice Providers (APPs -  Physician Assistants and Nurse Practitioners) who all work together to provide you with the care you need, when you need it.  We recommend signing up for the patient portal called "MyChart".  Sign up information is provided on this After Visit Summary.  MyChart is used to connect with patients for Virtual Visits (Telemedicine).  Patients are able to view lab/test results, encounter notes, upcoming appointments, etc.  Non-urgent messages can be sent to your provider as well.   To learn more about what you can do with MyChart, go to [email or web address].    Your next appointment:   Your physician wants you to follow-up in: 11/18/20 at 2:45 pm with Dr. Johney Frame.   Other Instructions:

## 2020-08-20 DIAGNOSIS — I480 Paroxysmal atrial fibrillation: Secondary | ICD-10-CM | POA: Diagnosis not present

## 2020-08-20 DIAGNOSIS — I119 Hypertensive heart disease without heart failure: Secondary | ICD-10-CM | POA: Diagnosis not present

## 2020-08-20 DIAGNOSIS — M810 Age-related osteoporosis without current pathological fracture: Secondary | ICD-10-CM | POA: Diagnosis not present

## 2020-08-20 DIAGNOSIS — I4891 Unspecified atrial fibrillation: Secondary | ICD-10-CM | POA: Diagnosis not present

## 2020-08-20 DIAGNOSIS — E782 Mixed hyperlipidemia: Secondary | ICD-10-CM | POA: Diagnosis not present

## 2020-08-20 DIAGNOSIS — I10 Essential (primary) hypertension: Secondary | ICD-10-CM | POA: Diagnosis not present

## 2020-08-20 DIAGNOSIS — G4700 Insomnia, unspecified: Secondary | ICD-10-CM | POA: Diagnosis not present

## 2020-08-20 DIAGNOSIS — K219 Gastro-esophageal reflux disease without esophagitis: Secondary | ICD-10-CM | POA: Diagnosis not present

## 2020-08-22 ENCOUNTER — Other Ambulatory Visit: Payer: Self-pay | Admitting: Cardiology

## 2020-08-22 NOTE — Telephone Encounter (Signed)
Called spoke with pt, pt states she is not on Warfarin.  She has been only taking Xarelto.  Advised pt I would refuse Warfarin refill requested by pharmacy.  Pt states she will call the pharmacy as well to let them know Warfarin needs to be removed from her automatic refill list at pharmacy.

## 2020-09-11 ENCOUNTER — Ambulatory Visit (INDEPENDENT_AMBULATORY_CARE_PROVIDER_SITE_OTHER): Payer: Medicare Other

## 2020-09-11 DIAGNOSIS — I480 Paroxysmal atrial fibrillation: Secondary | ICD-10-CM

## 2020-09-11 LAB — CUP PACEART REMOTE DEVICE CHECK
Battery Remaining Longevity: 66 mo
Battery Voltage: 3 V
Brady Statistic AP VP Percent: 0.29 %
Brady Statistic AP VS Percent: 79.33 %
Brady Statistic AS VP Percent: 3.57 %
Brady Statistic AS VS Percent: 16.81 %
Brady Statistic RA Percent Paced: 73.77 %
Brady Statistic RV Percent Paced: 3.83 %
Date Time Interrogation Session: 20220126125841
Implantable Lead Implant Date: 20180123
Implantable Lead Implant Date: 20180123
Implantable Lead Location: 753859
Implantable Lead Location: 753860
Implantable Lead Model: 5076
Implantable Lead Model: 5076
Implantable Pulse Generator Implant Date: 20180123
Lead Channel Impedance Value: 323 Ohm
Lead Channel Impedance Value: 361 Ohm
Lead Channel Impedance Value: 589 Ohm
Lead Channel Impedance Value: 665 Ohm
Lead Channel Pacing Threshold Amplitude: 0.5 V
Lead Channel Pacing Threshold Amplitude: 0.625 V
Lead Channel Pacing Threshold Pulse Width: 0.4 ms
Lead Channel Pacing Threshold Pulse Width: 0.4 ms
Lead Channel Sensing Intrinsic Amplitude: 0.875 mV
Lead Channel Sensing Intrinsic Amplitude: 0.875 mV
Lead Channel Sensing Intrinsic Amplitude: 9.25 mV
Lead Channel Sensing Intrinsic Amplitude: 9.25 mV
Lead Channel Setting Pacing Amplitude: 2 V
Lead Channel Setting Pacing Amplitude: 2.5 V
Lead Channel Setting Pacing Pulse Width: 0.4 ms
Lead Channel Setting Sensing Sensitivity: 2 mV

## 2020-09-12 ENCOUNTER — Telehealth: Payer: Self-pay

## 2020-09-12 NOTE — Telephone Encounter (Signed)
Patient offered appt today but pt would prefer to see Monica [name] tomorrow. Appt made for 1/28. Pt is holding metoprolol while BP is low.

## 2020-09-12 NOTE — Telephone Encounter (Signed)
Scheduled remote transmission received, Presenting rhytm AF rates 100-120bom.  Poor ventricular rate control.  Current episode started 09/09/20 ~7am.    Spoke with pt, during call she was noted to be SOB.  She reports feeling very weak and fatigued.  Her BP was 84/68 this AM so she has not yet taken her Sotalol or Metoprolol as prescribed    Pt snt in follow-up transmission which confirms episode still on progress.    Pt reports she typically calls AF clinic when this happens she was waiting a little longer to see if it converts on its own.  Advised I would forward info to AF clinic.

## 2020-09-13 ENCOUNTER — Other Ambulatory Visit: Payer: Self-pay

## 2020-09-13 ENCOUNTER — Ambulatory Visit (HOSPITAL_COMMUNITY)
Admission: RE | Admit: 2020-09-13 | Discharge: 2020-09-13 | Disposition: A | Discharge status: Home / Self Care | Payer: Medicare Other | Source: Ambulatory Visit | Attending: Nurse Practitioner | Admitting: Nurse Practitioner

## 2020-09-13 VITALS — BP 170/94 | HR 95 | Ht 63.0 in | Wt 150.4 lb

## 2020-09-13 DIAGNOSIS — I10 Essential (primary) hypertension: Secondary | ICD-10-CM | POA: Insufficient documentation

## 2020-09-13 DIAGNOSIS — Z8249 Family history of ischemic heart disease and other diseases of the circulatory system: Secondary | ICD-10-CM | POA: Diagnosis not present

## 2020-09-13 DIAGNOSIS — Z7901 Long term (current) use of anticoagulants: Secondary | ICD-10-CM | POA: Insufficient documentation

## 2020-09-13 DIAGNOSIS — I480 Paroxysmal atrial fibrillation: Secondary | ICD-10-CM

## 2020-09-13 DIAGNOSIS — D6869 Other thrombophilia: Secondary | ICD-10-CM

## 2020-09-13 DIAGNOSIS — I4891 Unspecified atrial fibrillation: Secondary | ICD-10-CM | POA: Insufficient documentation

## 2020-09-13 LAB — CBC
HCT: 42.4 % (ref 36.0–46.0)
Hemoglobin: 13.1 g/dL (ref 12.0–15.0)
MCH: 27.3 pg (ref 26.0–34.0)
MCHC: 30.9 g/dL (ref 30.0–36.0)
MCV: 88.5 fL (ref 80.0–100.0)
Platelets: 261 10*3/uL (ref 150–400)
RBC: 4.79 MIL/uL (ref 3.87–5.11)
RDW: 15.1 % (ref 11.5–15.5)
WBC: 7.7 10*3/uL (ref 4.0–10.5)
nRBC: 0 % (ref 0.0–0.2)

## 2020-09-13 LAB — BASIC METABOLIC PANEL
Anion gap: 8 (ref 5–15)
BUN: 18 mg/dL (ref 8–23)
CO2: 28 mmol/L (ref 22–32)
Calcium: 10.1 mg/dL (ref 8.9–10.3)
Chloride: 102 mmol/L (ref 98–111)
Creatinine, Ser: 1.04 mg/dL — ABNORMAL HIGH (ref 0.44–1.00)
GFR, Estimated: 54 mL/min — ABNORMAL LOW (ref >60)
Glucose, Bld: 91 mg/dL (ref 70–99)
Potassium: 5.1 mmol/L (ref 3.5–5.1)
Sodium: 138 mmol/L (ref 135–145)

## 2020-09-13 LAB — MAGNESIUM: Magnesium: 2.1 mg/dL (ref 1.7–2.4)

## 2020-09-13 NOTE — Progress Notes (Signed)
Primary Care Physician: Carol Ada, MD Referring Physician: Dr. Lisette Abu Monica [name] is a 80 y.o. female with a h/o afib that is in the afib clinic one month after ablation. This is her 3rd ablation, one in 2013,  2015 and most recently 10/21. She has done well since the procedure. Just a couple of days of afib that self converted. No swallowing or groin issues.continues on xarelto 20 mg daily with a CHA2DS2VASc score of 4.   F/u in afib clinic,09/13/20. She received an alert that she went back into afib 09/09/20. No trigger identified. Last time she went into afib, she converted after 10 days. She does feel fatigue and weakness being out of rhythm. No missed xarelto. BP elevated on presentation, recheck at 148/90.  Today, she denies symptoms of palpitations, chest pain, shortness of breath, orthopnea, PND, lower extremity edema, dizziness, presyncope, syncope, or neurologic sequela. The patient is tolerating medications without difficulties and is otherwise without complaint today.   Past Medical History:  Diagnosis Date  . Adrenal adenoma 6/11   lt on ct 6/11  . Atrial fibrillation ([place]) 01/21/2011  . Atrial flutter ([place]) 05/16/2012  . BCC (basal cell carcinoma of skin) 05/22/2013   Right Upper Lip (curet, cautery, and excision)  . BCC (basal cell carcinoma of skin) 08/24/2013   Right Upper Lip(Sclerosis + margin) (MOH's)  . Bradycardia 01/21/2011  . Coronary artery calcification seen on CT scan 11/01/2018  . GERD (gastroesophageal reflux disease)   . Hypercholesteremia   . Hypertension   . Kidney stone 6/11   rt. on ct 6/11  . 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 Osteoporosis   . Palpitations 05/30/2014  . Paroxysmal atrial fibrillation ([place])    a. PVI 10/13 b. PVI 12/15  . Superficial nodular basal cell carcinoma (BCC) 06/13/2019   Above Left Brow (curet and 5FU)  . Superficial nodular basal cell carcinoma (BCC)  06/13/2019   Right Cheekbone (curet and 5FU)   Past Surgical History:  Procedure Laterality Date  . ATRIAL FIBRILLATION ABLATION N/A 06/07/2012   PVI and CTI ablation by Dr Rayann Heman  . ATRIAL FIBRILLATION ABLATION N/A 07/31/2014   PVI by Dr Rayann Heman; initial ablation 2013  . ATRIAL FIBRILLATION ABLATION N/A 05/21/2020   Procedure: ATRIAL FIBRILLATION ABLATION;  Surgeon: Thompson Grayer, MD;  Location: [place] CV LAB;  Service: Cardiovascular;  Laterality: N/A;  . CARDIOVERSION  05/06/2012   Procedure: CARDIOVERSION;  Surgeon: Sueanne Margarita, MD;  Location: [facility] ENDOSCOPY;  Service: Cardiovascular;  Laterality: N/A;  h/p in file drawer  . CARDIOVERSION N/A 10/06/2017   Procedure: CARDIOVERSION;  Surgeon: Skeet Latch, MD;  Location: [facility] ENDOSCOPY;  Service: Cardiovascular;  Laterality: N/A;  . CARDIOVERSION N/A 03/28/2018   Procedure: CARDIOVERSION;  Surgeon: Dorothy Spark, MD;  Location: [facility] ENDOSCOPY;  Service: Cardiovascular;  Laterality: N/A;  . CARDIOVERSION N/A 02/09/2019   Procedure: CARDIOVERSION;  Surgeon: Pixie Casino, MD;  Location: [place];  Service: Cardiovascular;  Laterality: N/A;  . CESAREAN SECTION  1978-1979   x-2  . CHOLECYSTECTOMY  1989  . ELECTROPHYSIOLOGIC STUDY N/A 09/08/2016   Procedure: Cardioversion;  Surgeon: Thompson Grayer, MD;  Location: [place] CV LAB;  Service: Cardiovascular;  Laterality: N/A;  . EP IMPLANTABLE DEVICE N/A 09/08/2016   Procedure: Loop Recorder Removal;  Surgeon: Thompson Grayer, MD;  Location: [place] CV LAB;  Service: Cardiovascular;  Laterality: N/A;  . EP IMPLANTABLE DEVICE  N/A 09/08/2016   Procedure: Pacemaker Implant;  Surgeon: Thompson Grayer, MD;  Location: [place] CV LAB;  Service: Cardiovascular;  Laterality: N/A;  . Implantable loop recorder implantation  05/30/14   Medtronic Reveal LINQ implanted by Dr Rayann Heman with RIO II protocol (ambulatory setting)  . TEE WITHOUT CARDIOVERSION  06/06/2012   Procedure: TRANSESOPHAGEAL  ECHOCARDIOGRAM (TEE);  Surgeon: Jettie Booze, MD;  Location: [place];  Service: Cardiovascular;  Laterality: N/A;  . TEE WITHOUT CARDIOVERSION N/A 07/30/2014   Procedure: TRANSESOPHAGEAL ECHOCARDIOGRAM (TEE);  Surgeon: Larey Dresser, MD;  Location: [place];  Service: Cardiovascular;  Laterality: N/A;  . TEE WITHOUT CARDIOVERSION N/A 10/06/2017   Procedure: TRANSESOPHAGEAL ECHOCARDIOGRAM (TEE);  Surgeon: Skeet Latch, MD;  Location: [place];  Service: Cardiovascular;  Laterality: N/A;  . TEE WITHOUT CARDIOVERSION N/A 03/28/2018   Procedure: TRANSESOPHAGEAL ECHOCARDIOGRAM (TEE);  Surgeon: Dorothy Spark, MD;  Location: [facility] ENDOSCOPY;  Service: Cardiovascular;  Laterality: N/A;  . TEE WITHOUT CARDIOVERSION N/A 09/30/2018   Procedure: TRANSESOPHAGEAL ECHOCARDIOGRAM (TEE);  Surgeon: Elouise Munroe, MD;  Location: [facility] ENDOSCOPY;  Service: Cardiovascular;  Laterality: N/A;  . TEE WITHOUT CARDIOVERSION N/A 02/09/2019   Procedure: TRANSESOPHAGEAL ECHOCARDIOGRAM (TEE);  Surgeon: Pixie Casino, MD;  Location: [facility] ENDOSCOPY;  Service: Cardiovascular;  Laterality: N/A;    Current Outpatient Medications  Medication Sig Dispense Refill  . acetaminophen (TYLENOL) 500 MG tablet Take 250-500 mg by mouth daily as needed for moderate pain.     Marland Kitchen alum & mag hydroxide-simeth (MAALOX/MYLANTA) 200-200-20 MG/5ML suspension Take 15 mLs by mouth every 6 (six) hours as needed for indigestion or heartburn.    Marland Kitchen amLODipine (NORVASC) 2.5 MG tablet Take 1 tablet (2.5 mg total) by mouth daily. 180 tablet 3  . calcium carbonate (OS-CAL) 600 MG TABS tablet Take 600 mg by mouth 2 (two) times daily with a meal.     . Cholecalciferol (VITAMIN D) 50 MCG (2000 UT) tablet Take 2,000 Units by mouth every evening.     . metoprolol tartrate (LOPRESSOR) 50 MG tablet TAKE 1 AND 1/2 TABLETS BY MOUTH TWICE DAILY 135 tablet 1  . pantoprazole (PROTONIX) 40 MG tablet Take 40 mg by mouth daily before breakfast.      . Polyethyl Glycol-Propyl Glycol (SYSTANE ULTRA PF OP) Place 1 drop into both eyes daily as needed (dry eye).     . rivaroxaban (XARELTO) 20 MG TABS tablet Take 1 tablet (20 mg total) by mouth daily with supper. 90 tablet 1  . rosuvastatin (CRESTOR) 20 MG tablet TAKE ONE TABLET BY MOUTH ONCE DAILY 90 tablet 2  . sotalol (BETAPACE) 80 MG tablet TAKE ONE TABLET BY MOUTH TWICE DAILY 180 tablet 1  . traZODone (DESYREL) 50 MG tablet Take 50-75 mg by mouth at bedtime.     No current facility-administered medications for this encounter.    Allergies  Allergen Reactions  . Morphine And Related Nausea And Vomiting  . Codeine Nausea And Vomiting    Social History   Socioeconomic History  . Marital status: Widowed    Spouse name: Not on file  . Number of children: Not on file  . Years of education: Not on file  . Highest education level: Not on file  Occupational History  . Not on file  Tobacco Use  . Smoking status: Never Smoker  . Smokeless tobacco: Never Used  Substance and Sexual Activity  . Alcohol use: No  . Drug use: No  . Sexual activity: Not on file  Other  Topics Concern  . 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   [place]   Marital Status:single,widowed,2006.   Social Determinants of Health   Financial Resource Strain: Not on file  Food Insecurity: Not on file  Transportation Needs: Not on file  Physical Activity: Not on file  Stress: Not on file  Social Connections: Not on file  Intimate Partner Violence: Not on file    Family History  Problem Relation Age of Onset  . Heart attack Father   . CAD Father   . Hip fracture Mother        complication  . Lung cancer Brother   . Heart attack Brother        massive  . Other Brother        mva  . Thyroid cancer Sister   . Atrial fibrillation Sister   . Ovarian cancer Sister        surviver    ROS- All systems are reviewed and  negative except as per the HPI above  Physical Exam: Vitals:   09/13/20 1138  BP: (!) 170/94  Pulse: 95  Weight: 68.2 kg  Height: 5\' 3"  (1.6 m)   Wt Readings from Last 3 Encounters:  09/13/20 68.2 kg  08/19/20 69 kg  06/20/20 68 kg    Labs: Lab Results  Component Value Date   NA 138 04/24/2020   K 4.9 04/24/2020   CL 100 04/24/2020   CO2 25 04/24/2020   GLUCOSE 79 04/24/2020   BUN 13 04/24/2020   CREATININE 0.91 04/24/2020   CALCIUM 10.1 04/24/2020   MG 2.1 03/31/2019   Lab Results  Component Value Date   INR 4.0 (A) 04/24/2020   Lab Results  Component Value Date   CHOL 122 09/26/2019   HDL 58 09/26/2019   LDLCALC 46 09/26/2019   TRIG 93 09/26/2019     GEN- The patient is well appearing, alert and oriented x 3 today.   Head- normocephalic, atraumatic Eyes-  Sclera clear, conjunctiva pink Ears- hearing intact Oropharynx- clear Neck- supple, no JVP Lymph- no cervical lymphadenopathy Lungs- Clear to ausculation bilaterally, normal work of breathing Heart- irregular rate and rhythm, no murmurs, rubs or gallops, PMI not laterally displaced GI- soft, NT, ND, + BS Extremities- no clubbing, cyanosis, or edema MS- no significant deformity or atrophy Skin- no rash or lesion Psych- euthymic mood, full affect Neuro- strength and sensation are intact  EKG- afib at 95 bpm, qrs int 70 ms, qtc 459 ms    Assessment and Plan: 1. afib S/p 3rd ablation last October  Went  into afib 1/24 and ongoing Will schedule for cardioversion  Continue  sotalol 80 mg bid and metoprolol tartrate 37.5 mg bid  Cbc/bmet/mag/covid testing   2. CHA2DS2VASc of 4 Continue  xarelto 20 mg daily States no missed doses x 3 weeks   3. Htn  Elevated on presentation   Recheck at 148/90  F/u with afib clinic one week after CV  Donna C. Carroll, [facility] [address] [place], [place] [address] [phone]

## 2020-09-13 NOTE — Patient Instructions (Signed)
Cardioversion scheduled for Tuesday, February 8th  - Arrive at the [facility] and go to admitting at 830AM  - Do not eat or drink anything after midnight the night prior to your procedure.  - Take all your morning medication (except diabetic medications) with a sip of water prior to arrival.  - You will not be able to drive home after your procedure.  - Do NOT miss any doses of your blood thinner - if you should miss a dose please notify our office immediately.  - If you feel as if you go back into normal rhythm prior to scheduled cardioversion, please notify our office immediately. If your procedure is canceled in the cardioversion suite you will be charged a cancellation fee.

## 2020-09-20 ENCOUNTER — Telehealth (HOSPITAL_COMMUNITY): Payer: Self-pay | Admitting: *Deleted

## 2020-09-20 NOTE — Telephone Encounter (Signed)
Transmission received.

## 2020-09-20 NOTE — Telephone Encounter (Addendum)
Patient called stating she feels like she was NSR this morning - she is going to send transmission as she is scheduled for cardioversion next week. Will let pt know once transmission received.

## 2020-09-20 NOTE — Telephone Encounter (Signed)
Remote transmission received. Presenting EGM AP/VS rhythm.

## 2020-09-20 NOTE — Telephone Encounter (Signed)
Confirmed with Adline Peals PA. Cancel cardioversion. Pt informed.

## 2020-09-21 ENCOUNTER — Other Ambulatory Visit (HOSPITAL_COMMUNITY): Payer: Medicare Other

## 2020-09-23 NOTE — Progress Notes (Signed)
Remote pacemaker transmission.   

## 2020-09-24 ENCOUNTER — Encounter (HOSPITAL_COMMUNITY): Admission: RE | Payer: Self-pay | Source: Home / Self Care

## 2020-09-24 ENCOUNTER — Ambulatory Visit (HOSPITAL_COMMUNITY): Admission: RE | Admit: 2020-09-24 | Payer: Medicare Other | Source: Home / Self Care | Admitting: Cardiovascular Disease

## 2020-09-24 SURGERY — CARDIOVERSION
Anesthesia: General

## 2020-10-01 ENCOUNTER — Ambulatory Visit (HOSPITAL_COMMUNITY): Payer: Medicare Other | Admitting: Nurse Practitioner

## 2020-11-04 DIAGNOSIS — I480 Paroxysmal atrial fibrillation: Secondary | ICD-10-CM | POA: Diagnosis not present

## 2020-11-04 DIAGNOSIS — E782 Mixed hyperlipidemia: Secondary | ICD-10-CM | POA: Diagnosis not present

## 2020-11-04 DIAGNOSIS — K219 Gastro-esophageal reflux disease without esophagitis: Secondary | ICD-10-CM | POA: Diagnosis not present

## 2020-11-04 DIAGNOSIS — I10 Essential (primary) hypertension: Secondary | ICD-10-CM | POA: Diagnosis not present

## 2020-11-04 DIAGNOSIS — G4700 Insomnia, unspecified: Secondary | ICD-10-CM | POA: Diagnosis not present

## 2020-11-14 ENCOUNTER — Other Ambulatory Visit: Payer: Self-pay | Admitting: Internal Medicine

## 2020-11-14 MED ORDER — RIVAROXABAN 20 MG PO TABS: 20.0000 mg | ORAL_TABLET | Freq: Every day | ORAL | 1 refills | Status: DC

## 2020-11-14 NOTE — Telephone Encounter (Addendum)
Prescription refill request for Xarelto received.   Indication: Afib Last office visit: Monica [name], 09/13/2020 Weight: 68.2 kg  Age: 80 yo  Scr: 0.95, 11/04/2020 via [facility] CrCl: 50.85 ml/min    Discussed with Monica [name]. Pt is on the correct dose of Xarelto per dosing criteria, prescription refill sent for Xarelto 20mg  daily.

## 2020-11-18 ENCOUNTER — Other Ambulatory Visit: Payer: Self-pay

## 2020-11-18 ENCOUNTER — Encounter: Payer: Self-pay | Admitting: Internal Medicine

## 2020-11-18 ENCOUNTER — Ambulatory Visit: Payer: Medicare Other | Admitting: Internal Medicine

## 2020-11-18 VITALS — BP 134/84 | HR 102 | Ht 63.0 in | Wt 154.0 lb

## 2020-11-18 DIAGNOSIS — I10 Essential (primary) hypertension: Secondary | ICD-10-CM | POA: Diagnosis not present

## 2020-11-18 DIAGNOSIS — D6869 Other thrombophilia: Secondary | ICD-10-CM | POA: Diagnosis not present

## 2020-11-18 DIAGNOSIS — I480 Paroxysmal atrial fibrillation: Secondary | ICD-10-CM | POA: Diagnosis not present

## 2020-11-18 DIAGNOSIS — R001 Bradycardia, unspecified: Secondary | ICD-10-CM | POA: Diagnosis not present

## 2020-11-18 MED ORDER — AMIODARONE HCL 200 MG PO TABS: 200.0000 mg | ORAL_TABLET | Freq: Two times a day (BID) | ORAL | 3 refills | Status: DC

## 2020-11-18 NOTE — Patient Instructions (Addendum)
Medication Instructions:  Stop Sotalol On Wednesday 4/6 start Amiodarone 200 mg two times a day  Your physician recommends that you continue on your current medications as directed. Please refer to the Current Medication list given to you today.  Labwork: Cmet, TSH  Testing/Procedures: Your physician has recommended that you have a pulmonary function test. Pulmonary Function Tests are a group of tests that measure how well air moves in and out of your lungs.  Follow-Up:  Your physician wants you to follow-up in: 3 weeks with the Afib Clinic. 3 months with Dr. Rayann Heman.    Remote monitoring is used to monitor your Pacemaker from home. This monitoring reduces the number of office visits required to check your device to one time per year. It allows [place] to keep an eye on the functioning of your device to ensure it is working properly. You are scheduled for a device check from home on 12/11/20. You may send your transmission at any time that day. If you have a wireless device, the transmission will be sent automatically. After your physician reviews your transmission, you will receive a postcard with your next transmission date.  Any Other Special Instructions Will Be Listed Below (If Applicable).  If you need a refill on your cardiac medications before your next appointment, please call your pharmacy.

## 2020-11-18 NOTE — Progress Notes (Signed)
PCP: Carol Ada, MD   Primary EP:  Dr Kittie Plater is a 80 y.o. female who presents today for routine electrophysiology followup.  Since last being seen in our clinic, the patient reports doing reasonably well.  Unfortunately, her AF burden has increased.  + SOB and fatigue.  Today, she denies symptoms of palpitations, chest pain,  lower extremity edema, dizziness, presyncope, or syncope.  The patient is otherwise without complaint today.   Past Medical History:  Diagnosis Date  . Adrenal adenoma 6/11   lt on ct 6/11  . Atrial fibrillation ([place]) 01/21/2011  . Atrial flutter ([place]) 05/16/2012  . BCC (basal cell carcinoma of skin) 05/22/2013   Right Upper Lip (curet, cautery, and excision)  . BCC (basal cell carcinoma of skin) 08/24/2013   Right Upper Lip(Sclerosis + margin) (MOH's)  . Bradycardia 01/21/2011  . Coronary artery calcification seen on CT scan 11/01/2018  . GERD (gastroesophageal reflux disease)   . Hypercholesteremia   . Hypertension   . Kidney stone 6/11   rt. on ct 6/11  . 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 Osteoporosis   . Palpitations 05/30/2014  . Paroxysmal atrial fibrillation ([place])    a. PVI 10/13 b. PVI 12/15  . Superficial nodular basal cell carcinoma (BCC) 06/13/2019   Above Left Brow (curet and 5FU)  . Superficial nodular basal cell carcinoma (BCC) 06/13/2019   Right Cheekbone (curet and 5FU)   Past Surgical History:  Procedure Laterality Date  . ATRIAL FIBRILLATION ABLATION N/A 06/07/2012   PVI and CTI ablation by Dr Rayann Heman  . ATRIAL FIBRILLATION ABLATION N/A 07/31/2014   PVI by Dr Rayann Heman; initial ablation 2013  . ATRIAL FIBRILLATION ABLATION N/A 05/21/2020   Procedure: ATRIAL FIBRILLATION ABLATION;  Surgeon: Thompson Grayer, MD;  Location: [place] CV LAB;  Service: Cardiovascular;  Laterality: N/A;  . CARDIOVERSION  05/06/2012   Procedure: CARDIOVERSION;  Surgeon: Sueanne Margarita, MD;  Location: [facility] ENDOSCOPY;  Service: Cardiovascular;  Laterality: N/A;  h/p in file drawer  . CARDIOVERSION N/A 10/06/2017   Procedure: CARDIOVERSION;  Surgeon: Skeet Latch, MD;  Location: [facility] ENDOSCOPY;  Service: Cardiovascular;  Laterality: N/A;  . CARDIOVERSION N/A 03/28/2018   Procedure: CARDIOVERSION;  Surgeon: Dorothy Spark, MD;  Location: [facility] ENDOSCOPY;  Service: Cardiovascular;  Laterality: N/A;  . CARDIOVERSION N/A 02/09/2019   Procedure: CARDIOVERSION;  Surgeon: Pixie Casino, MD;  Location: [place];  Service: Cardiovascular;  Laterality: N/A;  . CESAREAN SECTION  1978-1979   x-2  . CHOLECYSTECTOMY  1989  . ELECTROPHYSIOLOGIC STUDY N/A 09/08/2016   Procedure: Cardioversion;  Surgeon: Thompson Grayer, MD;  Location: [place] CV LAB;  Service: Cardiovascular;  Laterality: N/A;  . EP IMPLANTABLE DEVICE N/A 09/08/2016   Procedure: Loop Recorder Removal;  Surgeon: Thompson Grayer, MD;  Location: [place] CV LAB;  Service: Cardiovascular;  Laterality: N/A;  . EP IMPLANTABLE DEVICE N/A 09/08/2016   Procedure: Pacemaker Implant;  Surgeon: Thompson Grayer, MD;  Location: [place] CV LAB;  Service: Cardiovascular;  Laterality: N/A;  . Implantable loop recorder implantation  05/30/14   Medtronic Reveal LINQ implanted by Dr Rayann Heman with RIO II protocol (ambulatory setting)  . TEE WITHOUT CARDIOVERSION  06/06/2012   Procedure: TRANSESOPHAGEAL ECHOCARDIOGRAM (TEE);  Surgeon: Jettie Booze, MD;  Location: [place];  Service: Cardiovascular;  Laterality: N/A;  . TEE WITHOUT CARDIOVERSION N/A 07/30/2014   Procedure: TRANSESOPHAGEAL ECHOCARDIOGRAM (TEE);  Surgeon: Larey Dresser, MD;  Location: [place];  Service: Cardiovascular;  Laterality: N/A;  . TEE WITHOUT CARDIOVERSION N/A 10/06/2017   Procedure: TRANSESOPHAGEAL ECHOCARDIOGRAM (TEE);  Surgeon: Skeet Latch, MD;  Location: [place];  Service: Cardiovascular;  Laterality: N/A;  . TEE WITHOUT  CARDIOVERSION N/A 03/28/2018   Procedure: TRANSESOPHAGEAL ECHOCARDIOGRAM (TEE);  Surgeon: Dorothy Spark, MD;  Location: [facility] ENDOSCOPY;  Service: Cardiovascular;  Laterality: N/A;  . TEE WITHOUT CARDIOVERSION N/A 09/30/2018   Procedure: TRANSESOPHAGEAL ECHOCARDIOGRAM (TEE);  Surgeon: Elouise Munroe, MD;  Location: [facility] ENDOSCOPY;  Service: Cardiovascular;  Laterality: N/A;  . TEE WITHOUT CARDIOVERSION N/A 02/09/2019   Procedure: TRANSESOPHAGEAL ECHOCARDIOGRAM (TEE);  Surgeon: Pixie Casino, MD;  Location: [facility] ENDOSCOPY;  Service: Cardiovascular;  Laterality: N/A;    ROS- all systems are reviewed and negative except as per HPI above  Current Outpatient Medications  Medication Sig Dispense Refill  . acetaminophen (TYLENOL) 500 MG tablet Take 250-500 mg by mouth every 6 (six) hours as needed for moderate pain.    Marland Kitchen alum & mag hydroxide-simeth (MAALOX/MYLANTA) 200-200-20 MG/5ML suspension Take 15 mLs by mouth every 6 (six) hours as needed for indigestion or heartburn.    Marland Kitchen amLODipine (NORVASC) 2.5 MG tablet Take 1 tablet (2.5 mg total) by mouth daily. (Patient taking differently: Take 2.5 mg by mouth every evening.) 180 tablet 3  . calcium carbonate (OS-CAL) 600 MG TABS tablet Take 600 mg by mouth 2 (two) times daily with a meal.     . calcium carbonate (TUMS - DOSED IN MG ELEMENTAL CALCIUM) 500 MG chewable tablet Chew 1-2 tablets by mouth 3 (three) times daily as needed for indigestion or heartburn.    . Cholecalciferol (VITAMIN D) 50 MCG (2000 UT) tablet Take 2,000 Units by mouth every evening.     . metoprolol tartrate (LOPRESSOR) 50 MG tablet TAKE 1 AND 1/2 TABLETS BY MOUTH TWICE DAILY (Patient taking differently: Take 75 mg by mouth 2 (two) times daily.) 135 tablet 1  . pantoprazole (PROTONIX) 40 MG tablet Take 40 mg by mouth daily before breakfast.     . Polyethyl Glycol-Propyl Glycol (SYSTANE ULTRA PF OP) Place 1 drop into both eyes daily as needed (dry eye).     . rivaroxaban (XARELTO)  20 MG TABS tablet Take 1 tablet (20 mg total) by mouth daily with supper. 90 tablet 1  . rosuvastatin (CRESTOR) 20 MG tablet TAKE ONE TABLET BY MOUTH ONCE DAILY (Patient taking differently: Take 20 mg by mouth every evening.) 90 tablet 2  . sotalol (BETAPACE) 80 MG tablet TAKE ONE TABLET BY MOUTH TWICE DAILY 180 tablet 3  . traZODone (DESYREL) 50 MG tablet Take 50-75 mg by mouth at bedtime.     No current facility-administered medications for this visit.    Physical Exam: Vitals:   11/18/20 1456  BP: 134/84  Pulse: (!) 102  SpO2: 98%  Weight: 154 lb (69.9 kg)  Height: 5\' 3"  (1.6 m)    GEN- The patient is well appearing, alert and oriented x 3 today.   Head- normocephalic, atraumatic Eyes-  Sclera clear, conjunctiva pink Ears- hearing intact Oropharynx- clear Lungs-   normal work of breathing Chest- pacemaker pocket is well healed Heart- irregular rate and rhythm  GI- soft  Extremities- no clubbing, cyanosis, or edema  Pacemaker interrogation- reviewed in deta+il today,  See PACEART report  ekg tracing ordered today is personally reviewed and shows afib with demand V pacing  Assessment and Plan:  1. Symptomatic sinus  bradycardia  Normal pacemaker function See Pace Art report No changes today she is not device dependant today  2. Persistent atrial fibrillation Doing well s/p ablation (3rd procedure) afib burden is 28 % (previously 16%) She is on xarelto for chads2vasc score of 4.  She is also on sotalol Qt is stable  Therapeutic strategies for afib including tikosyn and amiodarone were discussed in detail with the patient today. Risk, benefits, and alternatives to each medicine were discussed at length.  She would prefer amiodarone due to the fact that she does not wish to be hospitalized for tikosyn and costs.  She understands that risks of amiodarone include but are not limited to occular, liver, lung, and thyroid toxicity.  She understands that medicine can rarely be  fatal.  She wishes to proceed.  Stop sotalol today Start amiodarone 200mg  BID in 36 hours LFTs, TFTs, and PFTs are ordered today for baseline.  Follow-up in AF clinic in 3-4 weeks.  Reduce amiodarone to 200mg  daily then.  3. HTN Stable No change required today bmet today  4. HL Stable No change required today   Risks, benefits and potential toxicities for medications prescribed and/or refilled reviewed with patient today.   AF clinic in 3 weeks Return to see me in 3 months  Thompson Grayer MD, [facility] 11/18/2020 3:01 PM

## 2020-11-19 LAB — COMPREHENSIVE METABOLIC PANEL
ALT: 23 IU/L (ref 0–32)
AST: 29 IU/L (ref 0–40)
Albumin/Globulin Ratio: 1.8 (ref 1.2–2.2)
Albumin: 4.3 g/dL (ref 3.7–4.7)
Alkaline Phosphatase: 90 IU/L (ref 44–121)
BUN/Creatinine Ratio: 16 (ref 12–28)
BUN: 15 mg/dL (ref 8–27)
Bilirubin Total: 0.6 mg/dL (ref 0.0–1.2)
CO2: 26 mmol/L (ref 20–29)
Calcium: 9.9 mg/dL (ref 8.7–10.3)
Chloride: 101 mmol/L (ref 96–106)
Creatinine, Ser: 0.96 mg/dL (ref 0.57–1.00)
Globulin, Total: 2.4 g/dL (ref 1.5–4.5)
Glucose: 89 mg/dL (ref 65–99)
Potassium: 4.8 mmol/L (ref 3.5–5.2)
Sodium: 138 mmol/L (ref 134–144)
Total Protein: 6.7 g/dL (ref 6.0–8.5)
eGFR: 60 mL/min/{1.73_m2} (ref >59)

## 2020-11-19 LAB — TSH: TSH: 1.55 u[IU]/mL (ref 0.450–4.500)

## 2020-11-25 ENCOUNTER — Other Ambulatory Visit (HOSPITAL_COMMUNITY)
Admission: RE | Admit: 2020-11-25 | Discharge: 2020-11-25 | Disposition: A | Discharge status: Home / Self Care | Payer: Medicare Other | Source: Ambulatory Visit | Attending: Internal Medicine | Admitting: Internal Medicine

## 2020-11-25 ENCOUNTER — Ambulatory Visit: Payer: Medicare Other | Admitting: Dermatology

## 2020-11-25 ENCOUNTER — Other Ambulatory Visit: Payer: Self-pay

## 2020-11-25 ENCOUNTER — Encounter: Payer: Self-pay | Admitting: Dermatology

## 2020-11-25 DIAGNOSIS — C4491 Basal cell carcinoma of skin, unspecified: Secondary | ICD-10-CM

## 2020-11-25 DIAGNOSIS — L821 Other seborrheic keratosis: Secondary | ICD-10-CM | POA: Diagnosis not present

## 2020-11-25 DIAGNOSIS — Z01812 Encounter for preprocedural laboratory examination: Secondary | ICD-10-CM | POA: Diagnosis not present

## 2020-11-25 DIAGNOSIS — L309 Dermatitis, unspecified: Secondary | ICD-10-CM

## 2020-11-25 DIAGNOSIS — Z20822 Contact with and (suspected) exposure to covid-19: Secondary | ICD-10-CM | POA: Diagnosis not present

## 2020-11-25 DIAGNOSIS — C44311 Basal cell carcinoma of skin of nose: Secondary | ICD-10-CM | POA: Diagnosis not present

## 2020-11-25 DIAGNOSIS — D485 Neoplasm of uncertain behavior of skin: Secondary | ICD-10-CM

## 2020-11-25 DIAGNOSIS — D1801 Hemangioma of skin and subcutaneous tissue: Secondary | ICD-10-CM

## 2020-11-25 HISTORY — DX: Basal cell carcinoma of skin, unspecified: C44.91

## 2020-11-25 NOTE — Patient Instructions (Signed)

## 2020-11-26 LAB — SARS CORONAVIRUS 2 (TAT 6-24 HRS): SARS Coronavirus 2: NEGATIVE

## 2020-11-28 ENCOUNTER — Ambulatory Visit (INDEPENDENT_AMBULATORY_CARE_PROVIDER_SITE_OTHER): Payer: Medicare Other | Admitting: Internal Medicine

## 2020-11-28 ENCOUNTER — Other Ambulatory Visit: Payer: Self-pay

## 2020-11-28 DIAGNOSIS — I10 Essential (primary) hypertension: Secondary | ICD-10-CM

## 2020-11-28 DIAGNOSIS — R001 Bradycardia, unspecified: Secondary | ICD-10-CM

## 2020-11-28 DIAGNOSIS — I480 Paroxysmal atrial fibrillation: Secondary | ICD-10-CM

## 2020-11-28 DIAGNOSIS — D6869 Other thrombophilia: Secondary | ICD-10-CM

## 2020-11-28 LAB — PULMONARY FUNCTION TEST
DL/VA % pred: 108 %
DL/VA: 4.47 ml/min/mmHg/L
DLCO cor % pred: 101 %
DLCO cor: 18.51 ml/min/mmHg
DLCO unc % pred: 101 %
DLCO unc: 18.51 ml/min/mmHg
FEF 25-75 Post: 1.89 L/sec
FEF 25-75 Pre: 1.29 L/sec
FEF2575-%Change-Post: 46 %
FEF2575-%Pred-Post: 139 %
FEF2575-%Pred-Pre: 95 %
FEV1-%Change-Post: 16 %
FEV1-%Pred-Post: 99 %
FEV1-%Pred-Pre: 85 %
FEV1-Post: 1.83 L
FEV1-Pre: 1.58 L
FEV1FVC-%Change-Post: 16 %
FEV1FVC-%Pred-Pre: 97 %
FEV6-%Change-Post: 3 %
FEV6-%Pred-Post: 92 %
FEV6-%Pred-Pre: 89 %
FEV6-Post: 2.17 L
FEV6-Pre: 2.09 L
FEV6FVC-%Change-Post: 0 %
FEV6FVC-%Pred-Post: 105 %
FEV6FVC-%Pred-Pre: 105 %
FVC-%Change-Post: 0 %
FVC-%Pred-Post: 87 %
FVC-%Pred-Pre: 87 %
FVC-Post: 2.17 L
FVC-Pre: 2.18 L
Post FEV1/FVC ratio: 84 %
Post FEV6/FVC ratio: 100 %
Pre FEV1/FVC ratio: 72 %
Pre FEV6/FVC Ratio: 100 %
RV % pred: 99 %
RV: 2.33 L
TLC % pred: 89 %
TLC: 4.4 L

## 2020-11-28 NOTE — Progress Notes (Signed)
Full PFT completed today ? ?

## 2020-12-05 ENCOUNTER — Telehealth: Payer: Self-pay | Admitting: *Deleted

## 2020-12-05 ENCOUNTER — Encounter: Payer: Self-pay | Admitting: *Deleted

## 2020-12-05 ENCOUNTER — Telehealth: Payer: Self-pay

## 2020-12-05 ENCOUNTER — Encounter: Payer: Self-pay | Admitting: Dermatology

## 2020-12-05 NOTE — Telephone Encounter (Signed)
Pathology to patient- referral sent to skin surgery center for MOHS 

## 2020-12-05 NOTE — Telephone Encounter (Signed)
Path to patient and she is aware mohs needed

## 2020-12-05 NOTE — Telephone Encounter (Signed)
-----   Message from Lavonna Monarch, MD sent at 12/04/2020  5:44 AM EDT ----- Schedule Mohs

## 2020-12-05 NOTE — Progress Notes (Signed)
   Follow-Up Visit   Subjective  Monica [name] is a 80 y.o. female who presents for the following: Follow-up (Per patient she does not want a full body skin check. two previous spots on left lower neck that she would like to have rechecked. Dark lesion on the right side of nose patient believes is new she would like checked out. ).  Several skin issues to check, perhaps of most concern is a new spot on the right nostril. Location:  Duration:  Quality:  Associated Signs/Symptoms: Modifying Factors:  Severity:  Timing: Context:   Objective  Well appearing patient in no apparent distress; mood and affect are within normal limits. Objective  Left Parotid Area: Hairline and face, right/left neck: Flattopped textured brown 2 to 4 mm papules  Objective  Right Ala Nasi: Pearly dusky pink subtle endophytic thickening.     Objective  Head - Anterior (Face): Half dozen subtle mostly smooth erythematous 2 to 4 mm scattered facial papules.  I am uncertain of what these represent.  They are not typical of either actinic keratoses or rosacea.  If they get worse we will try a topical anti-inflammatory.    A focused examination was performed including Head, neck, arms, back.. Relevant physical exam findings are noted in the Assessment and Plan.   Assessment & Plan    Seborrheic keratosis Left Parotid Area  Leave if stable  Neoplasm of uncertain behavior of skin Right Ala Nasi  Skin / nail biopsy Type of biopsy: tangential   Informed consent: discussed and consent obtained   Timeout: patient name, date of birth, surgical site, and procedure verified   Procedure prep:  Patient was prepped and draped in usual sterile fashion (Non sterile) Prep type:  Chlorhexidine Anesthesia: the lesion was anesthetized in a standard fashion   Anesthetic:  1% lidocaine w/ epinephrine 1-100,000 local infiltration Instrument used: flexible razor blade   Outcome: patient tolerated procedure well    Post-procedure details: wound care instructions given    Specimen 1 - Surgical pathology Differential Diagnosis: bcc vs scc  Check Margins: No  Biopsy will be obtained.  If this is an infiltrative BCC I will recommend removal with Mohs surgery.  Dermatitis Head - Anterior (Face)  Observe      I, Lavonna Monarch, MD, have reviewed all documentation for this visit.  The documentation on 12/05/20 for the exam, diagnosis, procedures, and orders are all accurate and complete.

## 2020-12-11 ENCOUNTER — Encounter (HOSPITAL_COMMUNITY): Payer: Self-pay | Admitting: Nurse Practitioner

## 2020-12-11 ENCOUNTER — Ambulatory Visit (INDEPENDENT_AMBULATORY_CARE_PROVIDER_SITE_OTHER): Payer: Medicare Other

## 2020-12-11 ENCOUNTER — Other Ambulatory Visit: Payer: Self-pay

## 2020-12-11 ENCOUNTER — Ambulatory Visit (HOSPITAL_COMMUNITY)
Admission: RE | Admit: 2020-12-11 | Discharge: 2020-12-11 | Disposition: A | Discharge status: Home / Self Care | Payer: Medicare Other | Source: Ambulatory Visit | Attending: Nurse Practitioner | Admitting: Nurse Practitioner

## 2020-12-11 VITALS — BP 132/80 | HR 62 | Ht 63.0 in | Wt 154.0 lb

## 2020-12-11 DIAGNOSIS — I4891 Unspecified atrial fibrillation: Secondary | ICD-10-CM | POA: Insufficient documentation

## 2020-12-11 DIAGNOSIS — D6869 Other thrombophilia: Secondary | ICD-10-CM

## 2020-12-11 DIAGNOSIS — I480 Paroxysmal atrial fibrillation: Secondary | ICD-10-CM | POA: Diagnosis not present

## 2020-12-11 DIAGNOSIS — Z79899 Other long term (current) drug therapy: Secondary | ICD-10-CM | POA: Insufficient documentation

## 2020-12-11 DIAGNOSIS — Z8249 Family history of ischemic heart disease and other diseases of the circulatory system: Secondary | ICD-10-CM | POA: Diagnosis not present

## 2020-12-11 DIAGNOSIS — Z7901 Long term (current) use of anticoagulants: Secondary | ICD-10-CM | POA: Diagnosis not present

## 2020-12-11 DIAGNOSIS — I10 Essential (primary) hypertension: Secondary | ICD-10-CM | POA: Insufficient documentation

## 2020-12-11 LAB — CUP PACEART REMOTE DEVICE CHECK
Battery Remaining Longevity: 59 mo
Battery Voltage: 3 V
Brady Statistic AP VP Percent: 1 %
Brady Statistic AP VS Percent: 57.11 %
Brady Statistic AS VP Percent: 23.16 %
Brady Statistic AS VS Percent: 18.73 %
Brady Statistic RA Percent Paced: 49.02 %
Brady Statistic RV Percent Paced: 23.65 %
Date Time Interrogation Session: 20220427101619
Implantable Lead Implant Date: 20180123
Implantable Lead Implant Date: 20180123
Implantable Lead Location: 753859
Implantable Lead Location: 753860
Implantable Lead Model: 5076
Implantable Lead Model: 5076
Implantable Pulse Generator Implant Date: 20180123
Lead Channel Impedance Value: 304 Ohm
Lead Channel Impedance Value: 342 Ohm
Lead Channel Impedance Value: 475 Ohm
Lead Channel Impedance Value: 570 Ohm
Lead Channel Pacing Threshold Amplitude: 0.5 V
Lead Channel Pacing Threshold Amplitude: 0.625 V
Lead Channel Pacing Threshold Pulse Width: 0.4 ms
Lead Channel Pacing Threshold Pulse Width: 0.4 ms
Lead Channel Sensing Intrinsic Amplitude: 1.375 mV
Lead Channel Sensing Intrinsic Amplitude: 1.375 mV
Lead Channel Sensing Intrinsic Amplitude: 7.125 mV
Lead Channel Sensing Intrinsic Amplitude: 7.125 mV
Lead Channel Setting Pacing Amplitude: 2 V
Lead Channel Setting Pacing Amplitude: 2.5 V
Lead Channel Setting Pacing Pulse Width: 0.4 ms
Lead Channel Setting Sensing Sensitivity: 2 mV

## 2020-12-11 MED ORDER — AMIODARONE HCL 200 MG PO TABS: 200.0000 mg | ORAL_TABLET | Freq: Every day | ORAL | 2 refills | Status: DC

## 2020-12-11 NOTE — Progress Notes (Signed)
Primary Care Physician: Carol Ada, MD Referring Physician: Dr. Lisette Abu Monica [name] is a 80 y.o. female with a h/o afib that is in the afib clinic one month after ablation. This is her 3rd ablation, one in 2013,  2015 and most recently 10/21. She has done well since the procedure. Just a couple of days of afib that self converted. No swallowing or groin issues.continues on xarelto 20 mg daily with a CHA2DS2VASc score of 4.   F/u in afib clinic,09/13/20. She received an alert that she went back into afib 09/09/20. No trigger identified. Last time she went into afib, she converted after 10 days. She does feel fatigue and weakness being out of rhythm. No missed xarelto. BP elevated on presentation, recheck at 148/90.  F/u in afib clinic, 12/11/20. This is a f/u visit from Dr. Rayann Heman at which time pt c/o increased afib burden. IT was decided to washout sotalol and start amiodarone 200 mg bid 3 weeks ago. She has noted small amt afib but it is not sustained. Overall, she feels improved on amiodarone.   Today, she denies symptoms of palpitations, chest pain, shortness of breath, orthopnea, PND, lower extremity edema, dizziness, presyncope, syncope, or neurologic sequela. The patient is tolerating medications without difficulties and is otherwise without complaint today.   Past Medical History:  Diagnosis Date  . Adrenal adenoma 6/11   lt on ct 6/11  . Atrial fibrillation ([place]) 01/21/2011  . Atrial flutter ([place]) 05/16/2012  . Basal cell carcinoma 11/25/2020   nod- Right ala nasi (MOHS)  . BCC (basal cell carcinoma of skin) 05/22/2013   Right Upper Lip (curet, cautery, and excision)  . BCC (basal cell carcinoma of skin) 08/24/2013   Right Upper Lip(Sclerosis + margin) (MOH's)  . Bradycardia 01/21/2011  . Coronary artery calcification seen on CT scan 11/01/2018  . GERD (gastroesophageal reflux disease)   . Hypercholesteremia   . Hypertension   . Kidney stone 6/11   rt. on ct 6/11  .  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 Osteoporosis   . Palpitations 05/30/2014  . Paroxysmal atrial fibrillation ([place])    a. PVI 10/13 b. PVI 12/15  . Superficial nodular basal cell carcinoma (BCC) 06/13/2019   Above Left Brow (curet and 5FU)  . Superficial nodular basal cell carcinoma (BCC) 06/13/2019   Right Cheekbone (curet and 5FU)   Past Surgical History:  Procedure Laterality Date  . ATRIAL FIBRILLATION ABLATION N/A 06/07/2012   PVI and CTI ablation by Dr Rayann Heman  . ATRIAL FIBRILLATION ABLATION N/A 07/31/2014   PVI by Dr Rayann Heman; initial ablation 2013  . ATRIAL FIBRILLATION ABLATION N/A 05/21/2020   Procedure: ATRIAL FIBRILLATION ABLATION;  Surgeon: Thompson Grayer, MD;  Location: [place] [facility];  Service: Cardiovascular;  Laterality: N/A;  . CARDIOVERSION  05/06/2012   Procedure: CARDIOVERSION;  Surgeon: Sueanne Margarita, MD;  Location: [facility] ENDOSCOPY;  Service: Cardiovascular;  Laterality: N/A;  h/p in file drawer  . CARDIOVERSION N/A 10/06/2017   Procedure: CARDIOVERSION;  Surgeon: Skeet Latch, MD;  Location: [facility] ENDOSCOPY;  Service: Cardiovascular;  Laterality: N/A;  . CARDIOVERSION N/A 03/28/2018   Procedure: CARDIOVERSION;  Surgeon: Dorothy Spark, MD;  Location: [facility] ENDOSCOPY;  Service: Cardiovascular;  Laterality: N/A;  . CARDIOVERSION N/A 02/09/2019   Procedure: CARDIOVERSION;  Surgeon: Pixie Casino, MD;  Location: [facility] ENDOSCOPY;  Service: Cardiovascular;  Laterality: N/A;  . CESAREAN SECTION  [phone]  x-2  . CHOLECYSTECTOMY  1989  . ELECTROPHYSIOLOGIC STUDY N/A 09/08/2016   Procedure: Cardioversion;  Surgeon: Thompson Grayer, MD;  Location: [place] [facility];  Service: Cardiovascular;  Laterality: N/A;  . EP IMPLANTABLE DEVICE N/A 09/08/2016   Procedure: Loop Recorder Removal;  Surgeon: Thompson Grayer, MD;  Location: [place] [facility];  Service: Cardiovascular;  Laterality: N/A;  . EP IMPLANTABLE DEVICE N/A  09/08/2016   Procedure: Pacemaker Implant;  Surgeon: Thompson Grayer, MD;  Location: [place] [facility];  Service: Cardiovascular;  Laterality: N/A;  . Implantable loop recorder implantation  05/30/14   Medtronic Reveal LINQ implanted by Dr Rayann Heman with RIO II protocol (ambulatory setting)  . TEE WITHOUT CARDIOVERSION  06/06/2012   Procedure: TRANSESOPHAGEAL ECHOCARDIOGRAM (TEE);  Surgeon: Jettie Booze, MD;  Location: [place];  Service: Cardiovascular;  Laterality: N/A;  . TEE WITHOUT CARDIOVERSION N/A 07/30/2014   Procedure: TRANSESOPHAGEAL ECHOCARDIOGRAM (TEE);  Surgeon: Larey Dresser, MD;  Location: [place];  Service: Cardiovascular;  Laterality: N/A;  . TEE WITHOUT CARDIOVERSION N/A 10/06/2017   Procedure: TRANSESOPHAGEAL ECHOCARDIOGRAM (TEE);  Surgeon: Skeet Latch, MD;  Location: [place];  Service: Cardiovascular;  Laterality: N/A;  . TEE WITHOUT CARDIOVERSION N/A 03/28/2018   Procedure: TRANSESOPHAGEAL ECHOCARDIOGRAM (TEE);  Surgeon: Dorothy Spark, MD;  Location: [facility] ENDOSCOPY;  Service: Cardiovascular;  Laterality: N/A;  . TEE WITHOUT CARDIOVERSION N/A 09/30/2018   Procedure: TRANSESOPHAGEAL ECHOCARDIOGRAM (TEE);  Surgeon: Elouise Munroe, MD;  Location: [facility] ENDOSCOPY;  Service: Cardiovascular;  Laterality: N/A;  . TEE WITHOUT CARDIOVERSION N/A 02/09/2019   Procedure: TRANSESOPHAGEAL ECHOCARDIOGRAM (TEE);  Surgeon: Pixie Casino, MD;  Location: [facility] ENDOSCOPY;  Service: Cardiovascular;  Laterality: N/A;    Current Outpatient Medications  Medication Sig Dispense Refill  . acetaminophen (TYLENOL) 500 MG tablet Take 250-500 mg by mouth every 6 (six) hours as needed for moderate pain.    Marland Kitchen alum & mag hydroxide-simeth (MAALOX/MYLANTA) 200-200-20 MG/5ML suspension Take 15 mLs by mouth every 6 (six) hours as needed for indigestion or heartburn.    Marland Kitchen amLODipine (NORVASC) 2.5 MG tablet Take 1 tablet (2.5 mg total) by mouth daily. 180 tablet 3  . calcium carbonate  (OS-CAL) 600 MG TABS tablet Take 600 mg by mouth 2 (two) times daily with a meal.     . calcium carbonate (TUMS - DOSED IN MG ELEMENTAL CALCIUM) 500 MG chewable tablet Chew 1-2 tablets by mouth 3 (three) times daily as needed for indigestion or heartburn.    . Cholecalciferol (VITAMIN D) 50 MCG (2000 UT) tablet Take 2,000 Units by mouth every evening.     . metoprolol tartrate (LOPRESSOR) 50 MG tablet TAKE 1 AND 1/2 TABLETS BY MOUTH TWICE DAILY 135 tablet 1  . pantoprazole (PROTONIX) 40 MG tablet Take 40 mg by mouth daily before breakfast.     . Polyethyl Glycol-Propyl Glycol (SYSTANE ULTRA PF OP) Place 1 drop into both eyes daily as needed (dry eye).     . rivaroxaban (XARELTO) 20 MG TABS tablet Take 1 tablet (20 mg total) by mouth daily with supper. 90 tablet 1  . rosuvastatin (CRESTOR) 20 MG tablet TAKE ONE TABLET BY MOUTH ONCE DAILY 90 tablet 2  . traZODone (DESYREL) 50 MG tablet Take 50-75 mg by mouth at bedtime.    Marland Kitchen amiodarone (PACERONE) 200 MG tablet Take 1 tablet (200 mg total) by mouth daily. 90 tablet 2   No current facility-administered medications for this encounter.    Allergies  Allergen Reactions  . Morphine  And Related Nausea And Vomiting  . Codeine Nausea And Vomiting    Social History   Socioeconomic History  . Marital status: Widowed    Spouse name: Not on file  . Number of children: Not on file  . Years of education: Not on file  . Highest education level: Not on file  Occupational History  . Not on file  Tobacco Use  . Smoking status: Never Smoker  . Smokeless tobacco: Never Used  Substance and Sexual Activity  . Alcohol use: No  . Drug use: No  . Sexual activity: Not on file  Other Topics Concern  . 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   [employer]   Marital Status:single,widowed,2006.   Social Determinants of Health   Financial Resource Strain: Not on file  Food  Insecurity: Not on file  Transportation Needs: Not on file  Physical Activity: Not on file  Stress: Not on file  Social Connections: Not on file  Intimate Partner Violence: Not on file    Family History  Problem Relation Age of Onset  . Heart attack Father   . CAD Father   . Hip fracture Mother        complication  . Lung cancer Brother   . Heart attack Brother        massive  . Other Brother        mva  . Thyroid cancer Sister   . Atrial fibrillation Sister   . Ovarian cancer Sister        surviver    ROS- All systems are reviewed and negative except as per the HPI above  Physical Exam: Vitals:   12/11/20 1138  BP: 132/80  Pulse: 62  Weight: 69.9 kg  Height: 5\' 3"  (1.6 m)   Wt Readings from Last 3 Encounters:  12/11/20 69.9 kg  11/18/20 69.9 kg  09/13/20 68.2 kg    Labs: Lab Results  Component Value Date   NA 138 11/18/2020   K 4.8 11/18/2020   CL 101 11/18/2020   CO2 26 11/18/2020   GLUCOSE 89 11/18/2020   BUN 15 11/18/2020   CREATININE 0.96 11/18/2020   CALCIUM 9.9 11/18/2020   MG 2.1 09/13/2020   Lab Results  Component Value Date   INR 4.0 (A) 04/24/2020   Lab Results  Component Value Date   CHOL 122 09/26/2019   HDL 58 09/26/2019   LDLCALC 46 09/26/2019   TRIG 93 09/26/2019     GEN- The patient is well appearing, alert and oriented x 3 today.   Head- normocephalic, atraumatic Eyes-  Sclera clear, conjunctiva pink Ears- hearing intact Oropharynx- clear Neck- supple, no JVP Lymph- no cervical lymphadenopathy Lungs- Clear to ausculation bilaterally, normal work of breathing Heart-  regular rate and rhythm, no murmurs, rubs or gallops, PMI not laterally displaced GI- soft, NT, ND, + BS Extremities- no clubbing, cyanosis, or edema MS- no significant deformity or atrophy Skin- no rash or lesion Psych- euthymic mood, full affect Neuro- strength and sensation are intact  EKG- a paced at 62 bpm, pr int 138 ms, qrs int 78 ms, qtc 418 ms    Assessment and Plan: 1. afib S/p 3rd ablation last October   Increased afib burden,  sotalol stopped 11/18/20 and amiodarone 200 mg bid started Continue amiodarone 200 mg bid until Monday then reduce to 200 mg daily Continue metoprolol without change   2. CHA2DS2VASc of 4 Continue  xarelto 20 mg daily  3. Htn  Stable   F/u with Dr. Rayann Heman  03/13/21  Geroge Baseman. Carol Loftin, [facility] [address] [place], [place] [address] [phone]

## 2020-12-11 NOTE — Patient Instructions (Signed)
On Monday Decrease Amiodarone 200mg  once a day

## 2020-12-12 DIAGNOSIS — I10 Essential (primary) hypertension: Secondary | ICD-10-CM | POA: Diagnosis not present

## 2020-12-12 DIAGNOSIS — I119 Hypertensive heart disease without heart failure: Secondary | ICD-10-CM | POA: Diagnosis not present

## 2020-12-12 DIAGNOSIS — K219 Gastro-esophageal reflux disease without esophagitis: Secondary | ICD-10-CM | POA: Diagnosis not present

## 2020-12-12 DIAGNOSIS — E782 Mixed hyperlipidemia: Secondary | ICD-10-CM | POA: Diagnosis not present

## 2020-12-12 DIAGNOSIS — M810 Age-related osteoporosis without current pathological fracture: Secondary | ICD-10-CM | POA: Diagnosis not present

## 2020-12-12 DIAGNOSIS — I480 Paroxysmal atrial fibrillation: Secondary | ICD-10-CM | POA: Diagnosis not present

## 2020-12-12 DIAGNOSIS — G4700 Insomnia, unspecified: Secondary | ICD-10-CM | POA: Diagnosis not present

## 2020-12-16 DIAGNOSIS — M810 Age-related osteoporosis without current pathological fracture: Secondary | ICD-10-CM | POA: Diagnosis not present

## 2020-12-16 DIAGNOSIS — I4891 Unspecified atrial fibrillation: Secondary | ICD-10-CM | POA: Diagnosis not present

## 2020-12-16 DIAGNOSIS — E782 Mixed hyperlipidemia: Secondary | ICD-10-CM | POA: Diagnosis not present

## 2020-12-16 DIAGNOSIS — G4700 Insomnia, unspecified: Secondary | ICD-10-CM | POA: Diagnosis not present

## 2020-12-16 DIAGNOSIS — I10 Essential (primary) hypertension: Secondary | ICD-10-CM | POA: Diagnosis not present

## 2020-12-16 DIAGNOSIS — I480 Paroxysmal atrial fibrillation: Secondary | ICD-10-CM | POA: Diagnosis not present

## 2020-12-16 DIAGNOSIS — I119 Hypertensive heart disease without heart failure: Secondary | ICD-10-CM | POA: Diagnosis not present

## 2020-12-16 DIAGNOSIS — K219 Gastro-esophageal reflux disease without esophagitis: Secondary | ICD-10-CM | POA: Diagnosis not present

## 2021-01-01 NOTE — Progress Notes (Signed)
Remote pacemaker transmission.   

## 2021-01-15 DIAGNOSIS — I119 Hypertensive heart disease without heart failure: Secondary | ICD-10-CM | POA: Diagnosis not present

## 2021-01-15 DIAGNOSIS — G4700 Insomnia, unspecified: Secondary | ICD-10-CM | POA: Diagnosis not present

## 2021-01-15 DIAGNOSIS — I4891 Unspecified atrial fibrillation: Secondary | ICD-10-CM | POA: Diagnosis not present

## 2021-01-15 DIAGNOSIS — K219 Gastro-esophageal reflux disease without esophagitis: Secondary | ICD-10-CM | POA: Diagnosis not present

## 2021-01-15 DIAGNOSIS — E782 Mixed hyperlipidemia: Secondary | ICD-10-CM | POA: Diagnosis not present

## 2021-01-15 DIAGNOSIS — I10 Essential (primary) hypertension: Secondary | ICD-10-CM | POA: Diagnosis not present

## 2021-01-15 DIAGNOSIS — M810 Age-related osteoporosis without current pathological fracture: Secondary | ICD-10-CM | POA: Diagnosis not present

## 2021-01-15 DIAGNOSIS — I480 Paroxysmal atrial fibrillation: Secondary | ICD-10-CM | POA: Diagnosis not present

## 2021-01-28 ENCOUNTER — Other Ambulatory Visit: Payer: Self-pay

## 2021-02-10 ENCOUNTER — Other Ambulatory Visit: Payer: Self-pay | Admitting: Cardiology

## 2021-02-18 DIAGNOSIS — G4700 Insomnia, unspecified: Secondary | ICD-10-CM | POA: Diagnosis not present

## 2021-02-18 DIAGNOSIS — I4891 Unspecified atrial fibrillation: Secondary | ICD-10-CM | POA: Diagnosis not present

## 2021-02-18 DIAGNOSIS — M810 Age-related osteoporosis without current pathological fracture: Secondary | ICD-10-CM | POA: Diagnosis not present

## 2021-02-18 DIAGNOSIS — I10 Essential (primary) hypertension: Secondary | ICD-10-CM | POA: Diagnosis not present

## 2021-02-18 DIAGNOSIS — E782 Mixed hyperlipidemia: Secondary | ICD-10-CM | POA: Diagnosis not present

## 2021-02-18 DIAGNOSIS — I119 Hypertensive heart disease without heart failure: Secondary | ICD-10-CM | POA: Diagnosis not present

## 2021-02-18 DIAGNOSIS — I480 Paroxysmal atrial fibrillation: Secondary | ICD-10-CM | POA: Diagnosis not present

## 2021-02-18 DIAGNOSIS — K219 Gastro-esophageal reflux disease without esophagitis: Secondary | ICD-10-CM | POA: Diagnosis not present

## 2021-03-06 DIAGNOSIS — C44311 Basal cell carcinoma of skin of nose: Secondary | ICD-10-CM | POA: Diagnosis not present

## 2021-03-12 ENCOUNTER — Ambulatory Visit (INDEPENDENT_AMBULATORY_CARE_PROVIDER_SITE_OTHER): Payer: Medicare Other

## 2021-03-12 DIAGNOSIS — R001 Bradycardia, unspecified: Secondary | ICD-10-CM

## 2021-03-13 ENCOUNTER — Other Ambulatory Visit: Payer: Self-pay

## 2021-03-13 ENCOUNTER — Ambulatory Visit: Payer: Medicare Other | Admitting: Internal Medicine

## 2021-03-13 VITALS — BP 156/82 | HR 86 | Ht 63.0 in | Wt 155.2 lb

## 2021-03-13 DIAGNOSIS — I10 Essential (primary) hypertension: Secondary | ICD-10-CM

## 2021-03-13 DIAGNOSIS — I4819 Other persistent atrial fibrillation: Secondary | ICD-10-CM | POA: Diagnosis not present

## 2021-03-13 DIAGNOSIS — R001 Bradycardia, unspecified: Secondary | ICD-10-CM | POA: Diagnosis not present

## 2021-03-13 DIAGNOSIS — E785 Hyperlipidemia, unspecified: Secondary | ICD-10-CM | POA: Diagnosis not present

## 2021-03-13 LAB — CUP PACEART INCLINIC DEVICE CHECK
Battery Remaining Longevity: 62 mo
Battery Voltage: 2.99 V
Brady Statistic AP VP Percent: 1.51 %
Brady Statistic AP VS Percent: 88.5 %
Brady Statistic AS VP Percent: 4.64 %
Brady Statistic AS VS Percent: 5.34 %
Brady Statistic RA Percent Paced: 86.44 %
Brady Statistic RV Percent Paced: 6.52 %
Date Time Interrogation Session: 20220728140030
Implantable Lead Implant Date: 20180123
Implantable Lead Implant Date: 20180123
Implantable Lead Location: 753859
Implantable Lead Location: 753860
Implantable Lead Model: 5076
Implantable Lead Model: 5076
Implantable Pulse Generator Implant Date: 20180123
Lead Channel Impedance Value: 323 Ohm
Lead Channel Impedance Value: 361 Ohm
Lead Channel Impedance Value: 532 Ohm
Lead Channel Impedance Value: 627 Ohm
Lead Channel Pacing Threshold Amplitude: 0.75 V
Lead Channel Pacing Threshold Amplitude: 0.75 V
Lead Channel Pacing Threshold Pulse Width: 0.4 ms
Lead Channel Pacing Threshold Pulse Width: 0.4 ms
Lead Channel Sensing Intrinsic Amplitude: 2.25 mV
Lead Channel Sensing Intrinsic Amplitude: 7.75 mV
Lead Channel Setting Pacing Amplitude: 2 V
Lead Channel Setting Pacing Amplitude: 2.5 V
Lead Channel Setting Pacing Pulse Width: 0.4 ms
Lead Channel Setting Sensing Sensitivity: 2 mV

## 2021-03-13 LAB — CUP PACEART REMOTE DEVICE CHECK
Battery Remaining Longevity: 62 mo
Battery Voltage: 3 V
Brady Statistic AP VP Percent: 1.65 %
Brady Statistic AP VS Percent: 96.22 %
Brady Statistic AS VP Percent: 0.08 %
Brady Statistic AS VS Percent: 2.06 %
Brady Statistic RA Percent Paced: 97.33 %
Brady Statistic RV Percent Paced: 2.3 %
Date Time Interrogation Session: 20220727155541
Implantable Lead Implant Date: 20180123
Implantable Lead Implant Date: 20180123
Implantable Lead Location: 753859
Implantable Lead Location: 753860
Implantable Lead Model: 5076
Implantable Lead Model: 5076
Implantable Pulse Generator Implant Date: 20180123
Lead Channel Impedance Value: 323 Ohm
Lead Channel Impedance Value: 361 Ohm
Lead Channel Impedance Value: 532 Ohm
Lead Channel Impedance Value: 627 Ohm
Lead Channel Pacing Threshold Amplitude: 0.5 V
Lead Channel Pacing Threshold Amplitude: 0.75 V
Lead Channel Pacing Threshold Pulse Width: 0.4 ms
Lead Channel Pacing Threshold Pulse Width: 0.4 ms
Lead Channel Sensing Intrinsic Amplitude: 0.875 mV
Lead Channel Sensing Intrinsic Amplitude: 0.875 mV
Lead Channel Sensing Intrinsic Amplitude: 9.25 mV
Lead Channel Sensing Intrinsic Amplitude: 9.25 mV
Lead Channel Setting Pacing Amplitude: 2 V
Lead Channel Setting Pacing Amplitude: 2.5 V
Lead Channel Setting Pacing Pulse Width: 0.4 ms
Lead Channel Setting Sensing Sensitivity: 2 mV

## 2021-03-13 MED ORDER — METOPROLOL TARTRATE 25 MG PO TABS: ORAL_TABLET | ORAL | 3 refills | Status: DC

## 2021-03-13 NOTE — Progress Notes (Signed)
PCP: Carol Ada, MD   Primary EP:  Dr Lisette Abu Monica [name] is a 81 y.o. female who presents today for routine electrophysiology followup.  Since last being seen in our clinic, the patient reports doing very well.  Today, she denies symptoms of palpitations, chest pain, shortness of breath,  lower extremity edema, dizziness, presyncope, or syncope.  The patient is otherwise without complaint today.   Past Medical History:  Diagnosis Date   Adrenal adenoma 6/11   lt on ct 6/11   Atrial fibrillation ([place]) 01/21/2011   Atrial flutter ([place]) 05/16/2012   Basal cell carcinoma 11/25/2020   nod- Right ala nasi (MOHS)   BCC (basal cell carcinoma of skin) 05/22/2013   Right Upper Lip (curet, cautery, and excision)   BCC (basal cell carcinoma of skin) 08/24/2013   Right Upper Lip(Sclerosis + margin) (MOH's)   Bradycardia 01/21/2011   Coronary artery calcification seen on CT scan 11/01/2018   GERD (gastroesophageal reflux disease)    Hypercholesteremia    Hypertension    Kidney stone 6/11   rt. on ct 6/11   Nodular basal cell carcinoma (BCC) 06/13/2019   Left Temple (curet, cuatery and excision)   Osteoporosis    Palpitations 05/30/2014   Paroxysmal atrial fibrillation ([place])    a. PVI 10/13 b. PVI 12/15   Superficial nodular basal cell carcinoma (BCC) 06/13/2019   Above Left Brow (curet and 5FU)   Superficial nodular basal cell carcinoma (BCC) 06/13/2019   Right Cheekbone (curet and 5FU)   Past Surgical History:  Procedure Laterality Date   ATRIAL FIBRILLATION ABLATION N/A 06/07/2012   PVI and CTI ablation by Dr Rayann Heman   ATRIAL FIBRILLATION ABLATION N/A 07/31/2014   PVI by Dr Rayann Heman; initial ablation 2013   [place] N/A 05/21/2020   Procedure: [place];  Surgeon: Thompson Grayer, MD;  Location: [place] [facility];  Service: Cardiovascular;  Laterality: N/A;   CARDIOVERSION  05/06/2012   Procedure: CARDIOVERSION;  Surgeon: Sueanne Margarita, MD;   Location: [facility] ENDOSCOPY;  Service: Cardiovascular;  Laterality: N/A;  h/p in file drawer   CARDIOVERSION N/A 10/06/2017   Procedure: CARDIOVERSION;  Surgeon: Skeet Latch, MD;  Location: [facility] ENDOSCOPY;  Service: Cardiovascular;  Laterality: N/A;   CARDIOVERSION N/A 03/28/2018   Procedure: CARDIOVERSION;  Surgeon: Dorothy Spark, MD;  Location: [facility] ENDOSCOPY;  Service: Cardiovascular;  Laterality: N/A;   CARDIOVERSION N/A 02/09/2019   Procedure: CARDIOVERSION;  Surgeon: Pixie Casino, MD;  Location: [facility] ENDOSCOPY;  Service: Cardiovascular;  Laterality: N/A;   CESAREAN SECTION  1978-1979   x-2   CHOLECYSTECTOMY  1989   ELECTROPHYSIOLOGIC STUDY N/A 09/08/2016   Procedure: Cardioversion;  Surgeon: Thompson Grayer, MD;  Location: [place] [facility];  Service: Cardiovascular;  Laterality: N/A;   EP IMPLANTABLE DEVICE N/A 09/08/2016   Procedure: Loop Recorder Removal;  Surgeon: Thompson Grayer, MD;  Location: [place] [facility];  Service: Cardiovascular;  Laterality: N/A;   EP IMPLANTABLE DEVICE N/A 09/08/2016   Procedure: Pacemaker Implant;  Surgeon: Thompson Grayer, MD;  Location: [place] [facility];  Service: Cardiovascular;  Laterality: N/A;   Implantable loop recorder implantation  05/30/14   Medtronic Reveal LINQ implanted by Dr Rayann Heman with RIO II protocol (ambulatory setting)   TEE WITHOUT CARDIOVERSION  06/06/2012   Procedure: TRANSESOPHAGEAL ECHOCARDIOGRAM (TEE);  Surgeon: Jettie Booze, MD;  Location: [place];  Service: Cardiovascular;  Laterality: N/A;   TEE WITHOUT CARDIOVERSION N/A 07/30/2014   Procedure:  TRANSESOPHAGEAL ECHOCARDIOGRAM (TEE);  Surgeon: Larey Dresser, MD;  Location: [place];  Service: Cardiovascular;  Laterality: N/A;   TEE WITHOUT CARDIOVERSION N/A 10/06/2017   Procedure: TRANSESOPHAGEAL ECHOCARDIOGRAM (TEE);  Surgeon: Skeet Latch, MD;  Location: [place];  Service: Cardiovascular;  Laterality: N/A;   TEE WITHOUT CARDIOVERSION N/A 03/28/2018    Procedure: TRANSESOPHAGEAL ECHOCARDIOGRAM (TEE);  Surgeon: Dorothy Spark, MD;  Location: [facility] ENDOSCOPY;  Service: Cardiovascular;  Laterality: N/A;   TEE WITHOUT CARDIOVERSION N/A 09/30/2018   Procedure: TRANSESOPHAGEAL ECHOCARDIOGRAM (TEE);  Surgeon: Elouise Munroe, MD;  Location: [facility] ENDOSCOPY;  Service: Cardiovascular;  Laterality: N/A;   TEE WITHOUT CARDIOVERSION N/A 02/09/2019   Procedure: TRANSESOPHAGEAL ECHOCARDIOGRAM (TEE);  Surgeon: Pixie Casino, MD;  Location: [facility] ENDOSCOPY;  Service: Cardiovascular;  Laterality: N/A;    ROS- all systems are reviewed and negative except as per HPI above  Current Outpatient Medications  Medication Sig Dispense Refill   acetaminophen (TYLENOL) 500 MG tablet Take 250-500 mg by mouth every 6 (six) hours as needed for moderate pain.     alum & mag hydroxide-simeth (MAALOX/MYLANTA) 200-200-20 MG/5ML suspension Take 15 mLs by mouth every 6 (six) hours as needed for indigestion or heartburn.     amiodarone (PACERONE) 200 MG tablet Take 1 tablet (200 mg total) by mouth daily. 90 tablet 2   amLODipine (NORVASC) 2.5 MG tablet Take 1 tablet (2.5 mg total) by mouth daily. 180 tablet 3   calcium carbonate (OS-CAL) 600 MG TABS tablet Take 600 mg by mouth 2 (two) times daily with a meal.      calcium carbonate (TUMS - DOSED IN MG ELEMENTAL CALCIUM) 500 MG chewable tablet Chew 1-2 tablets by mouth 3 (three) times daily as needed for indigestion or heartburn.     Cholecalciferol (VITAMIN D) 50 MCG (2000 UT) tablet Take 2,000 Units by mouth every evening.      metoprolol tartrate (LOPRESSOR) 50 MG tablet TAKE 1 AND 1/2 TABLETS BY MOUTH TWICE DAILY 135 tablet 1   pantoprazole (PROTONIX) 40 MG tablet Take 40 mg by mouth daily before breakfast.      Polyethyl Glycol-Propyl Glycol (SYSTANE ULTRA PF OP) Place 1 drop into both eyes daily as needed (dry eye).      rivaroxaban (XARELTO) 20 MG TABS tablet Take 1 tablet (20 mg total) by mouth daily with supper. 90 tablet 1    rosuvastatin (CRESTOR) 20 MG tablet TAKE ONE TABLET BY MOUTH ONCE DAILY 90 tablet 3   traZODone (DESYREL) 50 MG tablet Take 50-75 mg by mouth at bedtime.     No current facility-administered medications for this visit.    Physical Exam: Vitals:   03/13/21 1353  BP: (!) 156/82  Pulse: 86  SpO2: 97%  Weight: 155 lb 3.2 oz (70.4 kg)  Height: '5\' 3"'$  (1.6 m)    GEN- The patient is well appearing, alert and oriented x 3 today.   Head- normocephalic, atraumatic Eyes-  Sclera clear, conjunctiva pink Ears- hearing intact Oropharynx- clear Lungs- Clear to ausculation bilaterally, normal work of breathing Chest- pacemaker pocket is well healed Heart- Regular rate and rhythm, no murmurs, rubs or gallops, PMI not laterally displaced GI- soft, NT, ND, + BS Extremities- no clubbing, cyanosis, or edema  Pacemaker interrogation- reviewed in detail today,  See PACEART report  ekg tracing ordered today is personally reviewed and shows atrial paced, first degree AV block, QRS 86 msec  Assessment and Plan:  1. Symptomatic sinus bradycardia  Normal pacemaker function See Claudia Desanctis  Art report No changes today she is not device dependant today  2. Paroxysmal atrial fibrillation Afib burden is 7.9% (28% last visit) Chads2vasc score is 4.  She was started on amiodarone last visit Continue xarelto Wean metoprolol Labs from 4/22 reviewed Repeat lFTs, TFTs today  3. HTN Elevated today She reports good control at home Reduce metoprolol to '50mg'$  BID x 4 weeks, then '25mg'$  BID.  Continue to wean on return  4. HL Stable No change required today   Risks, benefits and potential toxicities for medications prescribed and/or refilled reviewed with patient today.   Return in 3 months to see EP APP  Thompson Grayer MD, [facility] 03/13/2021 2:18 PM

## 2021-03-13 NOTE — Patient Instructions (Addendum)
Medication Instructions:  Reduce Metoprolol Tartrate to 50 mg two times a day for 4 weeks, then take 25 mg daily Your physician recommends that you continue on your current medications as directed. Please refer to the Current Medication list given to you today.  Labwork: Cmet, TSH  Testing/Procedures: None ordered.  Follow-Up: Your physician wants you to follow-up in: 06/17/21 at 2:15 pm with  Tommye Standard, PA-C   Remote monitoring is used to monitor your Pacemaker from home. This monitoring reduces the number of office visits required to check your device to one time per year. It allows [place] to keep an eye on the functioning of your device to ensure it is working properly. You are scheduled for a device check from home on 06/11/21. You may send your transmission at any time that day. If you have a wireless device, the transmission will be sent automatically. After your physician reviews your transmission, you will receive a postcard with your next transmission date.  Any Other Special Instructions Will Be Listed Below (If Applicable).  If you need a refill on your cardiac medications before your next appointment, please call your pharmacy.

## 2021-03-14 LAB — COMPREHENSIVE METABOLIC PANEL
ALT: 47 IU/L — ABNORMAL HIGH (ref 0–32)
AST: 42 IU/L — ABNORMAL HIGH (ref 0–40)
Albumin/Globulin Ratio: 1.6 (ref 1.2–2.2)
Albumin: 4.2 g/dL (ref 3.6–4.6)
Alkaline Phosphatase: 132 IU/L — ABNORMAL HIGH (ref 44–121)
BUN/Creatinine Ratio: 19 (ref 12–28)
BUN: 17 mg/dL (ref 8–27)
Bilirubin Total: 0.4 mg/dL (ref 0.0–1.2)
CO2: 24 mmol/L (ref 20–29)
Calcium: 10.3 mg/dL (ref 8.7–10.3)
Chloride: 100 mmol/L (ref 96–106)
Creatinine, Ser: 0.91 mg/dL (ref 0.57–1.00)
Globulin, Total: 2.7 g/dL (ref 1.5–4.5)
Glucose: 96 mg/dL (ref 65–99)
Potassium: 4.8 mmol/L (ref 3.5–5.2)
Sodium: 138 mmol/L (ref 134–144)
Total Protein: 6.9 g/dL (ref 6.0–8.5)
eGFR: 63 mL/min/{1.73_m2} (ref >59)

## 2021-03-14 LAB — TSH: TSH: 1.08 u[IU]/mL (ref 0.450–4.500)

## 2021-04-07 NOTE — Progress Notes (Signed)
Remote pacemaker transmission.   

## 2021-05-05 ENCOUNTER — Other Ambulatory Visit: Payer: Self-pay | Admitting: Internal Medicine

## 2021-05-05 NOTE — Telephone Encounter (Signed)
Prescription refill request for Xarelto received.  Indication: Afib  Last office visit:03/13/21 ([place])  Weight: 70.4kg Age: 80 Scr: 0.91 (03/13/21) CrCl: 53.68ml/min  Appropriate dose and refill sent to requested pharmacy.

## 2021-05-13 DIAGNOSIS — E782 Mixed hyperlipidemia: Secondary | ICD-10-CM | POA: Diagnosis not present

## 2021-05-13 DIAGNOSIS — K219 Gastro-esophageal reflux disease without esophagitis: Secondary | ICD-10-CM | POA: Diagnosis not present

## 2021-05-13 DIAGNOSIS — G4700 Insomnia, unspecified: Secondary | ICD-10-CM | POA: Diagnosis not present

## 2021-05-13 DIAGNOSIS — I10 Essential (primary) hypertension: Secondary | ICD-10-CM | POA: Diagnosis not present

## 2021-05-13 DIAGNOSIS — I119 Hypertensive heart disease without heart failure: Secondary | ICD-10-CM | POA: Diagnosis not present

## 2021-05-13 DIAGNOSIS — I480 Paroxysmal atrial fibrillation: Secondary | ICD-10-CM | POA: Diagnosis not present

## 2021-05-13 DIAGNOSIS — M810 Age-related osteoporosis without current pathological fracture: Secondary | ICD-10-CM | POA: Diagnosis not present

## 2021-05-13 DIAGNOSIS — I4891 Unspecified atrial fibrillation: Secondary | ICD-10-CM | POA: Diagnosis not present

## 2021-05-14 DIAGNOSIS — Z961 Presence of intraocular lens: Secondary | ICD-10-CM | POA: Diagnosis not present

## 2021-05-14 DIAGNOSIS — H26493 Other secondary cataract, bilateral: Secondary | ICD-10-CM | POA: Diagnosis not present

## 2021-05-14 DIAGNOSIS — H35033 Hypertensive retinopathy, bilateral: Secondary | ICD-10-CM | POA: Diagnosis not present

## 2021-05-14 DIAGNOSIS — I10 Essential (primary) hypertension: Secondary | ICD-10-CM | POA: Diagnosis not present

## 2021-05-19 DIAGNOSIS — I480 Paroxysmal atrial fibrillation: Secondary | ICD-10-CM | POA: Diagnosis not present

## 2021-05-19 DIAGNOSIS — Z23 Encounter for immunization: Secondary | ICD-10-CM | POA: Diagnosis not present

## 2021-05-19 DIAGNOSIS — G4700 Insomnia, unspecified: Secondary | ICD-10-CM | POA: Diagnosis not present

## 2021-05-19 DIAGNOSIS — K219 Gastro-esophageal reflux disease without esophagitis: Secondary | ICD-10-CM | POA: Diagnosis not present

## 2021-05-19 DIAGNOSIS — I10 Essential (primary) hypertension: Secondary | ICD-10-CM | POA: Diagnosis not present

## 2021-05-19 DIAGNOSIS — Z7901 Long term (current) use of anticoagulants: Secondary | ICD-10-CM | POA: Diagnosis not present

## 2021-05-19 DIAGNOSIS — D6869 Other thrombophilia: Secondary | ICD-10-CM | POA: Diagnosis not present

## 2021-05-19 DIAGNOSIS — H26493 Other secondary cataract, bilateral: Secondary | ICD-10-CM | POA: Diagnosis not present

## 2021-05-19 DIAGNOSIS — Z1389 Encounter for screening for other disorder: Secondary | ICD-10-CM | POA: Diagnosis not present

## 2021-05-19 DIAGNOSIS — E782 Mixed hyperlipidemia: Secondary | ICD-10-CM | POA: Diagnosis not present

## 2021-05-19 DIAGNOSIS — Z0000 Encounter for general adult medical examination without abnormal findings: Secondary | ICD-10-CM | POA: Diagnosis not present

## 2021-05-23 DIAGNOSIS — E782 Mixed hyperlipidemia: Secondary | ICD-10-CM | POA: Diagnosis not present

## 2021-05-23 DIAGNOSIS — I119 Hypertensive heart disease without heart failure: Secondary | ICD-10-CM | POA: Diagnosis not present

## 2021-05-23 DIAGNOSIS — I480 Paroxysmal atrial fibrillation: Secondary | ICD-10-CM | POA: Diagnosis not present

## 2021-05-23 DIAGNOSIS — K219 Gastro-esophageal reflux disease without esophagitis: Secondary | ICD-10-CM | POA: Diagnosis not present

## 2021-05-23 DIAGNOSIS — I10 Essential (primary) hypertension: Secondary | ICD-10-CM | POA: Diagnosis not present

## 2021-05-23 DIAGNOSIS — G4700 Insomnia, unspecified: Secondary | ICD-10-CM | POA: Diagnosis not present

## 2021-05-23 DIAGNOSIS — M810 Age-related osteoporosis without current pathological fracture: Secondary | ICD-10-CM | POA: Diagnosis not present

## 2021-05-23 DIAGNOSIS — I4891 Unspecified atrial fibrillation: Secondary | ICD-10-CM | POA: Diagnosis not present

## 2021-06-11 ENCOUNTER — Ambulatory Visit (INDEPENDENT_AMBULATORY_CARE_PROVIDER_SITE_OTHER): Payer: Medicare Other

## 2021-06-11 DIAGNOSIS — I4819 Other persistent atrial fibrillation: Secondary | ICD-10-CM

## 2021-06-12 LAB — CUP PACEART REMOTE DEVICE CHECK
Battery Remaining Longevity: 55 mo
Battery Voltage: 2.99 V
Brady Statistic AP VP Percent: 0.29 %
Brady Statistic AP VS Percent: 98.19 %
Brady Statistic AS VP Percent: 0.01 %
Brady Statistic AS VS Percent: 1.52 %
Brady Statistic RA Percent Paced: 98.37 %
Brady Statistic RV Percent Paced: 0.32 %
Date Time Interrogation Session: 20221027143203
Implantable Lead Implant Date: 20180123
Implantable Lead Implant Date: 20180123
Implantable Lead Location: 753859
Implantable Lead Location: 753860
Implantable Lead Model: 5076
Implantable Lead Model: 5076
Implantable Pulse Generator Implant Date: 20180123
Lead Channel Impedance Value: 304 Ohm
Lead Channel Impedance Value: 361 Ohm
Lead Channel Impedance Value: 494 Ohm
Lead Channel Impedance Value: 589 Ohm
Lead Channel Pacing Threshold Amplitude: 0.5 V
Lead Channel Pacing Threshold Amplitude: 0.875 V
Lead Channel Pacing Threshold Pulse Width: 0.4 ms
Lead Channel Pacing Threshold Pulse Width: 0.4 ms
Lead Channel Sensing Intrinsic Amplitude: 1.625 mV
Lead Channel Sensing Intrinsic Amplitude: 1.625 mV
Lead Channel Sensing Intrinsic Amplitude: 8.875 mV
Lead Channel Sensing Intrinsic Amplitude: 8.875 mV
Lead Channel Setting Pacing Amplitude: 2 V
Lead Channel Setting Pacing Amplitude: 2.5 V
Lead Channel Setting Pacing Pulse Width: 0.4 ms
Lead Channel Setting Sensing Sensitivity: 2 mV

## 2021-06-15 NOTE — Progress Notes (Signed)
Cardiology Office Note Date:  06/17/2021  Patient ID:  Monica, [name] 1941-08-02, MRN [id number] PCP:  Carol Ada, MD  Cardiologist: Dr. Radford Pax Electrophysiologist: Dr. Rayann Heman    Chief Complaint: 3 mo follow up  History of Present Illness: Monica [name] is a 80 y.o. female with history of symptomatic bradycardia w/PPM, AFib, HTN, HLD  She comes today to be seen for Dr. Rayann Heman, last seen by him July 2022, AF burden was done to 7.9% (from 28%) planned to continue amiodarone and wean off metoprolol with plans to see again in 3 mo and continue to wean off BB.  Labs updated  TODAY She is feeling better on reduced lopressor dose. Reports home numbers 110's-120's/60's-70's, and has not felt any AFib since starting amiodarone She does not have all the energy she would like, but feels well otherwise. No CP, no SOB, no near syncope or syncope  She struggles with constipation and hemorrhoids, has some bleeding with BM but none otherwise.    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  AFib Hx PVI ablation Oct 2013, Dec 2015, Jan 2-18  AAD Hx Sotalol started 2018 >> stopped April 2022 with increasing AF burden Amiodarone started April 2022 >> current   Past Medical History:  Diagnosis Date   Adrenal adenoma 6/11   lt on ct 6/11   Atrial fibrillation ([place]) 01/21/2011   Atrial flutter ([place]) 05/16/2012   Basal cell carcinoma 11/25/2020   nod- Right ala nasi (MOHS)   BCC (basal cell carcinoma of skin) 05/22/2013   Right Upper Lip (curet, cautery, and excision)   BCC (basal cell carcinoma of skin) 08/24/2013   Right Upper Lip(Sclerosis + margin) (MOH's)   Bradycardia 01/21/2011   Coronary artery calcification seen on CT scan 11/01/2018   GERD (gastroesophageal reflux disease)    Hypercholesteremia    Hypertension    Kidney stone 6/11   rt. on ct 6/11   Nodular basal cell carcinoma (BCC) 06/13/2019   Left [place] (curet, cuatery and excision)    Osteoporosis    Palpitations 05/30/2014   Paroxysmal atrial fibrillation ([place])    a. PVI 10/13 b. PVI 12/15   Superficial nodular basal cell carcinoma (BCC) 06/13/2019   Above Left Brow (curet and 5FU)   Superficial nodular basal cell carcinoma (BCC) 06/13/2019   Right Cheekbone (curet and 5FU)    Past Surgical History:  Procedure Laterality Date   ATRIAL FIBRILLATION ABLATION N/A 06/07/2012   PVI and CTI ablation by Dr Rayann Heman   ATRIAL FIBRILLATION ABLATION N/A 07/31/2014   PVI by Dr Rayann Heman; initial ablation 2013   [place] N/A 05/21/2020   Procedure: [place];  Surgeon: Thompson Grayer, MD;  Location: [place] [facility];  Service: Cardiovascular;  Laterality: N/A;   CARDIOVERSION  05/06/2012   Procedure: CARDIOVERSION;  Surgeon: Sueanne Margarita, MD;  Location: [facility] ENDOSCOPY;  Service: Cardiovascular;  Laterality: N/A;  h/p in file drawer   CARDIOVERSION N/A 10/06/2017   Procedure: CARDIOVERSION;  Surgeon: Skeet Latch, MD;  Location: [facility] ENDOSCOPY;  Service: Cardiovascular;  Laterality: N/A;   CARDIOVERSION N/A 03/28/2018   Procedure: CARDIOVERSION;  Surgeon: Dorothy Spark, MD;  Location: [facility] ENDOSCOPY;  Service: Cardiovascular;  Laterality: N/A;   CARDIOVERSION N/A 02/09/2019   Procedure: CARDIOVERSION;  Surgeon: Pixie Casino, MD;  Location: [place];  Service: Cardiovascular;  Laterality: N/A;   CESAREAN SECTION  1978-1979   x-2   CHOLECYSTECTOMY  1989   ELECTROPHYSIOLOGIC STUDY N/A  09/08/2016   Procedure: Cardioversion;  Surgeon: Thompson Grayer, MD;  Location: [place] [facility];  Service: Cardiovascular;  Laterality: N/A;   EP IMPLANTABLE DEVICE N/A 09/08/2016   Procedure: Loop Recorder Removal;  Surgeon: Thompson Grayer, MD;  Location: [place] [facility];  Service: Cardiovascular;  Laterality: N/A;   EP IMPLANTABLE DEVICE N/A 09/08/2016   Procedure: Pacemaker Implant;  Surgeon: Thompson Grayer, MD;  Location: [place] [facility];  Service:  Cardiovascular;  Laterality: N/A;   Implantable loop recorder implantation  05/30/14   [employer]tr[employer] Reveal LINQ implanted by Dr Rayann Heman with RIO II protocol (ambulatory setting)   TEE WITHOUT CARDIOVERSION  06/06/2012   Procedure: TRANSESOPHAGEAL ECHOCARDIOGRAM (TEE);  Surgeon: Jettie Booze, MD;  Location: [place];  Service: Cardiovascular;  Laterality: N/A;   TEE WITHOUT CARDIOVERSION N/A 07/30/2014   Procedure: TRANSESOPHAGEAL ECHOCARDIOGRAM (TEE);  Surgeon: Larey Dresser, MD;  Location: [place];  Service: Cardiovascular;  Laterality: N/A;   TEE WITHOUT CARDIOVERSION N/A 10/06/2017   Procedure: TRANSESOPHAGEAL ECHOCARDIOGRAM (TEE);  Surgeon: Skeet Latch, MD;  Location: [place];  Service: Cardiovascular;  Laterality: N/A;   TEE WITHOUT CARDIOVERSION N/A 03/28/2018   Procedure: TRANSESOPHAGEAL ECHOCARDIOGRAM (TEE);  Surgeon: Dorothy Spark, MD;  Location: [facility] ENDOSCOPY;  Service: Cardiovascular;  Laterality: N/A;   TEE WITHOUT CARDIOVERSION N/A 09/30/2018   Procedure: TRANSESOPHAGEAL ECHOCARDIOGRAM (TEE);  Surgeon: Elouise Munroe, MD;  Location: [facility] ENDOSCOPY;  Service: Cardiovascular;  Laterality: N/A;   TEE WITHOUT CARDIOVERSION N/A 02/09/2019   Procedure: TRANSESOPHAGEAL ECHOCARDIOGRAM (TEE);  Surgeon: Pixie Casino, MD;  Location: [facility] ENDOSCOPY;  Service: Cardiovascular;  Laterality: N/A;    Current Outpatient Medications  Medication Sig Dispense Refill   acetaminophen (TYLENOL) 500 MG tablet Take 250-500 mg by mouth every 6 (six) hours as needed for moderate pain.     alum & mag hydroxide-simeth (MAALOX/MYLANTA) 200-200-20 MG/5ML suspension Take 15 mLs by mouth every 6 (six) hours as needed for indigestion or heartburn.     amiodarone (PACERONE) 200 MG tablet Take 1 tablet (200 mg total) by mouth daily. 90 tablet 2   amLODipine (NORVASC) 2.5 MG tablet Take 1 tablet (2.5 mg total) by mouth daily. 180 tablet 3   calcium carbonate (OS-CAL) 600 MG TABS tablet  Take 600 mg by mouth 2 (two) times daily with a meal.      calcium carbonate (TUMS - DOSED IN MG ELEMENTAL CALCIUM) 500 MG chewable tablet Chew 1-2 tablets by mouth 3 (three) times daily as needed for indigestion or heartburn.     Cholecalciferol (VITAMIN D) 50 MCG (2000 UT) tablet Take 2,000 Units by mouth every evening.      metoprolol tartrate (LOPRESSOR) 25 MG tablet Take 2 tablets (50 mg total) by mouth 2 (two) times daily for 28 days, THEN 1 tablet (25 mg total) 2 (two) times daily. 180 tablet 3   pantoprazole (PROTONIX) 40 MG tablet Take 40 mg by mouth daily before breakfast.      Polyethyl Glycol-Propyl Glycol (SYSTANE ULTRA PF OP) Place 1 drop into both eyes daily as needed (dry eye).      rosuvastatin (CRESTOR) 20 MG tablet TAKE ONE TABLET BY MOUTH ONCE DAILY 90 tablet 3   traZODone (DESYREL) 50 MG tablet Take 50-75 mg by mouth at bedtime.     XARELTO 20 MG TABS tablet TAKE ONE TABLET BY MOUTH EVERY EVENING with SUPPER 90 tablet 1   No current facility-administered medications for this visit.    Allergies:   Morphine and  related and Codeine   Social History:  The patient  reports that she has never smoked. She has never used smokeless tobacco. She reports that she does not drink alcohol and does not use drugs.   Family History:  The patient's family history includes Atrial fibrillation in her sister; CAD in her father; Heart attack in her brother and father; Hip fracture in her mother; Lung cancer in her brother; Other in her brother; Ovarian cancer in her sister; Thyroid cancer in her sister.  ROS:  Please see the history of present illness.    All other systems are reviewed and otherwise negative.   PHYSICAL EXAM:  VS:  BP (!) 160/80   Pulse 69   Ht 5\' 3"  (1.6 m)   Wt 155 lb 12.8 oz (70.7 kg)   SpO2 98%   BMI 27.60 kg/m  BMI: Body mass index is 27.6 kg/m. Well nourished, well developed, in no acute distress HEENT: normocephalic, atraumatic Neck: no JVD, carotid bruits or  masses Cardiac:  RRR; no significant murmurs, no rubs, or gallops Lungs:  CTA b/l, no wheezing, rhonchi or rales Abd: soft, nontender MS: no deformity or atrophy Ext: no edema Skin: warm and dry, no rash Neuro:  No gross deficits appreciated Psych: euthymic mood, full affect  PPM site is stable, no tethering or discomfort   EKG:  Done today and reviewed by myself shows  Not done today  Device interrogation done today and reviewed by myself:  Battery and lead measurements are good Some brief AMS AF burden <0.1%    04/24/2020: TTE IMPRESSIONS   1. Left ventricular ejection fraction, by estimation, is 60 to 65%. The  left ventricle has normal function. The left ventricle has no regional  wall motion abnormalities. Left ventricular diastolic parameters are  consistent with Grade II diastolic  dysfunction (pseudonormalization).   2. Right ventricular systolic function is normal. The right ventricular  size is mildly enlarged. There is normal pulmonary artery systolic  pressure. The estimated right ventricular systolic pressure is 32.3 mmHg.   3. Left atrial size was moderately dilated.   4. Right atrial size was mildly dilated.   5. The mitral valve is normal in structure. No evidence of mitral valve  regurgitation. No evidence of mitral stenosis.   6. The aortic valve is tricuspid. Aortic valve regurgitation is trivial.  Mild to moderate aortic valve sclerosis/calcification is present, without  any evidence of aortic stenosis. Aortic valve mean gradient measures 5.0  mmHg. Aortic valve Vmax measures  1.54 m/s.   7. The inferior vena cava is normal in size with greater than 50%  respiratory variability, suggesting right atrial pressure of 3 mmHg.   Comparison(s): No significant change from prior study. Prior images  reviewed side by side.    05/21/2020: EPS/Ablation CONCLUSIONS: 1.  Sinus rhythm upon presentation.   2. Intracardiac echo reveals a moderate sized left atrium  with four separate pulmonary veins without evidence of pulmonary vein stenosis. 3. Minimal return of conduction along the carina between the left superior and inferior pulmonary veins.  Ablation was performed at this location.   4. Return of electrical activity along the right pulmonary veins with successful sequential electrical re isolation and anatomical encircling of right right pulmonary veins using a WACA approach 5. Additional left atrial ablation was performed with a standard box lesion created along the posterior wall of the left atrium 6. No early apparent complications.   12/14/2018: stress myoview The left ventricular ejection fraction is  normal (55-65%). Nuclear stress EF: 61%. No T wave inversion was noted during stress. This is a low risk study.   Normal perfusion. LVEF 61% with normal wall motion. This is a low risk study.      Recent Labs: 09/13/2020: Hemoglobin 13.1; Magnesium 2.1; Platelets 261 03/13/2021: ALT 47; BUN 17; Creatinine, Ser 0.91; Potassium 4.8; Sodium 138; TSH 1.080  No results found for requested labs within last 8760 hours.   CrCl cannot be calculated (Patient's most recent lab result is older than the maximum 21 days allowed.).   Wt Readings from Last 3 Encounters:  06/17/21 155 lb 12.8 oz (70.7 kg)  03/13/21 155 lb 3.2 oz (70.4 kg)  12/11/20 154 lb (69.9 kg)     Other studies reviewed: Additional studies/records reviewed today include: summarized above  ASSESSMENT AND PLAN:  PPM Intact function No programming changes  Paroxysmal Afib CHA2DS2Vasc is 4, on Xarelto, appropriately dosed <0.1 % burden Much improved on amiodarone  HTN Great home readings  We discussed Dr. Albin Felling plan to titrate off lopressor thugh she is quite happy curretnly and no changes are made  Disposition: F/u with remotes as usual, will see her back in 71mo, plan amio labs then.  Current medicines are reviewed at length with the patient today.  The patient did  not have any concerns regarding medicines.  Venetia Night, PA-C 06/17/2021 5:02 PM     [address] [place] [address] [phone] (office)  [phone] (fax)

## 2021-06-16 NOTE — Progress Notes (Signed)
Remote pacemaker transmission.   

## 2021-06-17 ENCOUNTER — Encounter: Payer: Self-pay | Admitting: Physician Assistant

## 2021-06-17 ENCOUNTER — Ambulatory Visit: Payer: Medicare Other | Admitting: Physician Assistant

## 2021-06-17 ENCOUNTER — Other Ambulatory Visit: Payer: Self-pay

## 2021-06-17 VITALS — BP 160/80 | HR 69 | Ht 63.0 in | Wt 155.8 lb

## 2021-06-17 DIAGNOSIS — R001 Bradycardia, unspecified: Secondary | ICD-10-CM | POA: Diagnosis not present

## 2021-06-17 DIAGNOSIS — Z950 Presence of cardiac pacemaker: Secondary | ICD-10-CM | POA: Diagnosis not present

## 2021-06-17 DIAGNOSIS — I480 Paroxysmal atrial fibrillation: Secondary | ICD-10-CM

## 2021-06-17 DIAGNOSIS — I10 Essential (primary) hypertension: Secondary | ICD-10-CM | POA: Diagnosis not present

## 2021-06-17 DIAGNOSIS — I4892 Unspecified atrial flutter: Secondary | ICD-10-CM

## 2021-06-17 LAB — CUP PACEART INCLINIC DEVICE CHECK
Battery Remaining Longevity: 55 mo
Battery Voltage: 2.99 V
Brady Statistic AP VP Percent: 0.28 %
Brady Statistic AP VS Percent: 98.26 %
Brady Statistic AS VP Percent: 0.01 %
Brady Statistic AS VS Percent: 1.45 %
Brady Statistic RA Percent Paced: 98.44 %
Brady Statistic RV Percent Paced: 0.32 %
Date Time Interrogation Session: 20221101172656
Implantable Lead Implant Date: 20180123
Implantable Lead Implant Date: 20180123
Implantable Lead Location: 753859
Implantable Lead Location: 753860
Implantable Lead Model: 5076
Implantable Lead Model: 5076
Implantable Pulse Generator Implant Date: 20180123
Lead Channel Impedance Value: 323 Ohm
Lead Channel Impedance Value: 380 Ohm
Lead Channel Impedance Value: 532 Ohm
Lead Channel Impedance Value: 646 Ohm
Lead Channel Pacing Threshold Amplitude: 0.5 V
Lead Channel Pacing Threshold Amplitude: 0.75 V
Lead Channel Pacing Threshold Pulse Width: 0.4 ms
Lead Channel Pacing Threshold Pulse Width: 0.4 ms
Lead Channel Sensing Intrinsic Amplitude: 0.875 mV
Lead Channel Sensing Intrinsic Amplitude: 1.75 mV
Lead Channel Sensing Intrinsic Amplitude: 7.125 mV
Lead Channel Sensing Intrinsic Amplitude: 9.125 mV
Lead Channel Setting Pacing Amplitude: 2 V
Lead Channel Setting Pacing Amplitude: 2.5 V
Lead Channel Setting Pacing Pulse Width: 0.4 ms
Lead Channel Setting Sensing Sensitivity: 2 mV

## 2021-06-17 NOTE — Patient Instructions (Addendum)
Medication Instructions:   Your physician recommends that you continue on your current medications as directed. Please refer to the Current Medication list given to you today.   *If you need a refill on your cardiac medications before your next appointment, please call your pharmacy*   Lab Work:  [place]   If you have labs (blood work) drawn today and your tests are completely normal, you will receive your results only by: [place] (if you have MyChart) OR A paper copy in the mail If you have any lab test that is abnormal or we need to change your treatment, we will call you to review the results.   Testing/Procedures: NONE ORDERED  TODAY    Follow-Up: At [facility], you and your health needs are our priority.  As part of our continuing mission to provide you with exceptional heart care, we have created designated Provider Care Teams.  These Care Teams include your primary Cardiologist (physician) and Advanced Practice Providers (APPs -  Physician Assistants and Nurse Practitioners) who all work together to provide you with the care you need, when you need it.  We recommend signing up for the patient portal called "MyChart".  Sign up information is provided on this After Visit Summary.  MyChart is used to connect with patients for Virtual Visits (Telemedicine).  Patients are able to view lab/test results, encounter notes, upcoming appointments, etc.  Non-urgent messages can be sent to your provider as well.   To learn more about what you can do with MyChart, go to [email or web address].    Your next appointment:   3 month(s)  The format for your next appointment:   In Person  Provider:    Tommye Standard  Other Instructions

## 2021-08-12 ENCOUNTER — Other Ambulatory Visit (HOSPITAL_COMMUNITY): Payer: Self-pay | Admitting: Nurse Practitioner

## 2021-08-13 DIAGNOSIS — E782 Mixed hyperlipidemia: Secondary | ICD-10-CM | POA: Diagnosis not present

## 2021-08-13 DIAGNOSIS — M810 Age-related osteoporosis without current pathological fracture: Secondary | ICD-10-CM | POA: Diagnosis not present

## 2021-08-13 DIAGNOSIS — I119 Hypertensive heart disease without heart failure: Secondary | ICD-10-CM | POA: Diagnosis not present

## 2021-08-13 DIAGNOSIS — K219 Gastro-esophageal reflux disease without esophagitis: Secondary | ICD-10-CM | POA: Diagnosis not present

## 2021-08-13 DIAGNOSIS — I10 Essential (primary) hypertension: Secondary | ICD-10-CM | POA: Diagnosis not present

## 2021-08-13 DIAGNOSIS — G4700 Insomnia, unspecified: Secondary | ICD-10-CM | POA: Diagnosis not present

## 2021-08-13 DIAGNOSIS — I480 Paroxysmal atrial fibrillation: Secondary | ICD-10-CM | POA: Diagnosis not present

## 2021-09-10 ENCOUNTER — Telehealth: Payer: Self-pay

## 2021-09-10 ENCOUNTER — Ambulatory Visit (INDEPENDENT_AMBULATORY_CARE_PROVIDER_SITE_OTHER): Payer: Medicare Other

## 2021-09-10 DIAGNOSIS — I480 Paroxysmal atrial fibrillation: Secondary | ICD-10-CM | POA: Diagnosis not present

## 2021-09-10 NOTE — Telephone Encounter (Signed)
The patient was having an issue with sending the transmission. I called [employer] to get additional help. The patient handheld will be replaced in 02-23-2022.

## 2021-09-16 LAB — CUP PACEART REMOTE DEVICE CHECK
Battery Remaining Longevity: 57 mo
Battery Voltage: 2.99 V
Brady Statistic AP VP Percent: 0.37 %
Brady Statistic AP VS Percent: 98.43 %
Brady Statistic AS VP Percent: 0 %
Brady Statistic AS VS Percent: 1.2 %
Brady Statistic RA Percent Paced: 98.69 %
Brady Statistic RV Percent Paced: 0.4 %
Date Time Interrogation Session: 20230130123122
Implantable Lead Implant Date: 20180123
Implantable Lead Implant Date: 20180123
Implantable Lead Location: 753859
Implantable Lead Location: 753860
Implantable Lead Model: 5076
Implantable Lead Model: 5076
Implantable Pulse Generator Implant Date: 20180123
Lead Channel Impedance Value: 285 Ohm
Lead Channel Impedance Value: 323 Ohm
Lead Channel Impedance Value: 437 Ohm
Lead Channel Impedance Value: 532 Ohm
Lead Channel Pacing Threshold Amplitude: 0.5 V
Lead Channel Pacing Threshold Amplitude: 0.875 V
Lead Channel Pacing Threshold Pulse Width: 0.4 ms
Lead Channel Pacing Threshold Pulse Width: 0.4 ms
Lead Channel Sensing Intrinsic Amplitude: 1.5 mV
Lead Channel Sensing Intrinsic Amplitude: 1.5 mV
Lead Channel Sensing Intrinsic Amplitude: 7.5 mV
Lead Channel Sensing Intrinsic Amplitude: 7.5 mV
Lead Channel Setting Pacing Amplitude: 2 V
Lead Channel Setting Pacing Amplitude: 2.5 V
Lead Channel Setting Pacing Pulse Width: 0.4 ms
Lead Channel Setting Sensing Sensitivity: 2 mV

## 2021-09-21 NOTE — Progress Notes (Signed)
Cardiology Office Note Date:  09/21/2021  Patient ID:  Monica, [name] [birth year], MRN [id number] PCP:  Monica Ada, MD  Cardiologist: Dr. Radford Pax Electrophysiologist: Dr. Rayann Heman    Chief Complaint:  3 mo follow up  History of Present Illness: Monica [name] is a 81 y.o. female with history of symptomatic bradycardia w/PPM, AFib, HTN, HLD  She comes today to be seen for Dr. Rayann Heman, last seen by him July 2022, AF burden was done to 7.9% (from 28%) planned to continue amiodarone and wean off metoprolol with plans to see again in 3 mo and continue to wean off BB.  Labs updated  I saw her 06/17/21 She is feeling better on reduced lopressor dose. Reports home numbers 110's-120's/60's-70's, and has not felt any AFib since starting amiodarone She does not have all the energy she would like, but feels well otherwise. No CP, no SOB, no near syncope or syncope She struggles with constipation and hemorrhoids, has some bleeding with BM but none otherwise. She was feeling well, did not want to wean BB, no changes were made  TODAY She is ding quite well Able to do her ADL's,  cares for herself and her home independently though wishes she had better stamina. She gives an example that when raking her leaves she has to take breaks getting winded, SOB, can work longer with the blower, but enjoys raking as well. Activities that require both upper/lower body exertion tire her quicker then routine activities.  No CP, no rest SOB No near syncope or syncope. She does not think she has had any AFib and is very happy about that.  Her current level of exertional capacity is unchanged for over a year, but was hoping with managing the Afib, she would have better stamina, though very thankful without the AFIb feeling much improved overall.   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  AFib Hx PVI ablation  Oct 2013, Dec 2015, Jan 2-18  AAD Hx Sotalol started 2018 >> stopped April 2022 with increasing AF burden Amiodarone started April 2022 >> current   Past Medical History:  Diagnosis Date   Adrenal adenoma 6/11   lt on ct 6/11   Atrial fibrillation ([place]) 01/21/2011   Atrial flutter ([place]) 05/16/2012   Basal cell carcinoma 11/25/2020   nod- Right ala nasi (MOHS)   BCC (basal cell carcinoma of skin) 05/22/2013   Right Upper Lip (curet, cautery, and excision)   BCC (basal cell carcinoma of skin) 08/24/2013   Right Upper Lip(Sclerosis + margin) (MOH's)   Bradycardia 01/21/2011   Coronary artery calcification seen on CT scan 11/01/2018   GERD (gastroesophageal reflux disease)    Hypercholesteremia    Hypertension    Kidney stone 6/11   rt. on ct 6/11   Nodular basal cell carcinoma (BCC) 06/13/2019   Left [place] (curet, cuatery and excision)   Osteoporosis    Palpitations 05/30/2014   Paroxysmal atrial fibrillation ([place])    a. PVI 10/13 b. PVI 12/15   Superficial nodular basal cell carcinoma (BCC) 06/13/2019   Above Left Brow (curet and 5FU)   Superficial nodular basal cell carcinoma (BCC) 06/13/2019   Right Cheekbone (curet and 5FU)    Past Surgical History:  Procedure Laterality Date   ATRIAL FIBRILLATION ABLATION N/A 06/07/2012   PVI and CTI ablation by Dr Rayann Heman   ATRIAL FIBRILLATION ABLATION N/A 07/31/2014   PVI by  Dr Rayann Heman; initial ablation 2013   [place] N/A 05/21/2020   Procedure: ATRIAL FIBRILLATION ABLATION;  Surgeon: Thompson Grayer, MD;  Location: [place] [facility];  Service: Cardiovascular;  Laterality: N/A;   CARDIOVERSION  05/06/2012   Procedure: CARDIOVERSION;  Surgeon: Sueanne Margarita, MD;  Location[facility] ENDOSCOPY;  Service: Cardiovascular;  Laterality: N/A;  h/p in file drawer   CARDIOVERSION N/A 10/06/2017   Procedure: CARDIOVERSION;  Surgeon: Skeet Latch, MD;  Location: [facility] ENDOSCOPY;  Service: Cardiovascular;  Laterality: N/A;    CARDIOVERSION N/A 03/28/2018   Procedure: CARDIOVERSION;  Surgeon: Dorothy Spark, MD;  Location: [facility] ENDOSCOPY;  Service: Cardiovascular;  Laterality: N/A;   CARDIOVERSION N/A 02/09/2019   Procedure: CARDIOVERSION;  Surgeon: Pixie Casino, MD;  Location: [facility] ENDOSCOPY;  Service: Cardiovascular;  Laterality: N/A;   CESAREAN SECTION  1978-1979   x-2   CHOLECYSTECTOMY  1989   ELECTROPHYSIOLOGIC STUDY N/A 09/08/2016   Procedure: Cardioversion;  Surgeon: Thompson Grayer, MD;  Location: [place] [facility];  Service: Cardiovascular;  Laterality: N/A;   EP IMPLANTABLE DEVICE N/A 09/08/2016   Procedure: Loop Recorder Removal;  Surgeon: Thompson Grayer, MD;  Location: [place] [facility];  Service: Cardiovascular;  Laterality: N/A;   EP IMPLANTABLE DEVICE N/A 09/08/2016   Procedure: Pacemaker Implant;  Surgeon: Thompson Grayer, MD;  Location: [place] [facility];  Service: Cardiovascular;  Laterality: N/A;   Implantable loop recorder implantation  05/30/14   Medtronic Reveal LINQ implanted by Dr Rayann Heman with RIO II protocol (ambulatory setting)   TEE WITHOUT CARDIOVERSION  06/06/2012   Procedure: TRANSESOPHAGEAL ECHOCARDIOGRAM (TEE);  Surgeon: Jettie Booze, MD;  Location: [place];  Service: Cardiovascular;  Laterality: N/A;   TEE WITHOUT CARDIOVERSION N/A 07/30/2014   Procedure: TRANSESOPHAGEAL ECHOCARDIOGRAM (TEE);  Surgeon: Larey Dresser, MD;  Location: [place];  Service: Cardiovascular;  Laterality: N/A;   TEE WITHOUT CARDIOVERSION N/A 10/06/2017   Procedure: TRANSESOPHAGEAL ECHOCARDIOGRAM (TEE);  Surgeon: Skeet Latch, MD;  Location: [place];  Service: Cardiovascular;  Laterality: N/A;   TEE WITHOUT CARDIOVERSION N/A 03/28/2018   Procedure: TRANSESOPHAGEAL ECHOCARDIOGRAM (TEE);  Surgeon: Dorothy Spark, MD;  Location: [facility] ENDOSCOPY;  Service: Cardiovascular;  Laterality: N/A;   TEE WITHOUT CARDIOVERSION N/A 09/30/2018   Procedure: TRANSESOPHAGEAL ECHOCARDIOGRAM (TEE);  Surgeon:  Elouise Munroe, MD;  Location: [facility] ENDOSCOPY;  Service: Cardiovascular;  Laterality: N/A;   TEE WITHOUT CARDIOVERSION N/A 02/09/2019   Procedure: TRANSESOPHAGEAL ECHOCARDIOGRAM (TEE);  Surgeon: Pixie Casino, MD;  Location: [facility] ENDOSCOPY;  Service: Cardiovascular;  Laterality: N/A;    Current Outpatient Medications  Medication Sig Dispense Refill   acetaminophen (TYLENOL) 500 MG tablet Take 250-500 mg by mouth every 6 (six) hours as needed for moderate pain.     alum & mag hydroxide-simeth (MAALOX/MYLANTA) 200-200-20 MG/5ML suspension Take 15 mLs by mouth every 6 (six) hours as needed for indigestion or heartburn.     amiodarone (PACERONE) 200 MG tablet TAKE ONE TABLET BY MOUTH ONCE DAILY 90 tablet 2   amLODipine (NORVASC) 2.5 MG tablet Take 1 tablet (2.5 mg total) by mouth daily. 180 tablet 3   calcium carbonate (OS-CAL) 600 MG TABS tablet Take 600 mg by mouth 2 (two) times daily with a meal.      calcium carbonate (TUMS - DOSED IN MG ELEMENTAL CALCIUM) 500 MG chewable tablet Chew 1-2 tablets by mouth 3 (three) times daily as needed for indigestion or heartburn.     Cholecalciferol (VITAMIN D) 50 MCG (2000 UT) tablet  Take 2,000 Units by mouth every evening.      metoprolol tartrate (LOPRESSOR) 25 MG tablet Take 2 tablets (50 mg total) by mouth 2 (two) times daily for 28 days, THEN 1 tablet (25 mg total) 2 (two) times daily. 180 tablet 3   pantoprazole (PROTONIX) 40 MG tablet Take 40 mg by mouth daily before breakfast.      Polyethyl Glycol-Propyl Glycol (SYSTANE ULTRA PF OP) Place 1 drop into both eyes daily as needed (dry eye).      rosuvastatin (CRESTOR) 20 MG tablet TAKE ONE TABLET BY MOUTH ONCE DAILY 90 tablet 3   traZODone (DESYREL) 50 MG tablet Take 50-75 mg by mouth at bedtime.     XARELTO 20 MG TABS tablet TAKE ONE TABLET BY MOUTH EVERY EVENING with SUPPER 90 tablet 1   No current facility-administered medications for this visit.    Allergies:   Morphine and related and Codeine    Social History:  The patient  reports that she has never smoked. She has never used smokeless tobacco. She reports that she does not drink alcohol and does not use drugs.   Family History:  The patient's family history includes Atrial fibrillation in her sister; CAD in her father; Heart attack in her brother and father; Hip fracture in her mother; Lung cancer in her brother; Other in her brother; Ovarian cancer in her sister; Thyroid cancer in her sister.  ROS:  Please see the history of present illness.    All other systems are reviewed and otherwise negative.   PHYSICAL EXAM:  VS:  There were no vitals taken for this visit. BMI: There is no height or weight on file to calculate BMI. Well nourished, well developed, in no acute distress HEENT: normocephalic, atraumatic Neck: no JVD, carotid bruits or masses Cardiac:  RRR; no significant murmurs, no rubs, or gallops Lungs:  CTA b/l, no wheezing, rhonchi or rales Abd: soft, nontender MS: no deformity or atrophy Ext: no edema Skin: warm and dry, no rash Neuro:  No gross deficits appreciated Psych: euthymic mood, full affect  PPM site is stable, no tethering or discomfort   EKG:  Not done today  Device interrogation done today and reviewed by myself:  Battery and lead measurements are good She has had a couple short episodes of AFlutter, burden is <0.1%    04/24/2020: TTE IMPRESSIONS   1. Left ventricular ejection fraction, by estimation, is 60 to 65%. The  left ventricle has normal function. The left ventricle has no regional  wall motion abnormalities. Left ventricular diastolic parameters are  consistent with Grade II diastolic  dysfunction (pseudonormalization).   2. Right ventricular systolic function is normal. The right ventricular  size is mildly enlarged. There is normal pulmonary artery systolic  pressure. The estimated right ventricular systolic pressure is 94.1 mmHg.   3. Left atrial size was moderately dilated.    4. Right atrial size was mildly dilated.   5. The mitral valve is normal in structure. No evidence of mitral valve  regurgitation. No evidence of mitral stenosis.   6. The aortic valve is tricuspid. Aortic valve regurgitation is trivial.  Mild to moderate aortic valve sclerosis/calcification is present, without  any evidence of aortic stenosis. Aortic valve mean gradient measures 5.0  mmHg. Aortic valve Vmax measures  1.54 m/s.   7. The inferior vena cava is normal in size with greater than 50%  respiratory variability, suggesting right atrial pressure of 3 mmHg.   Comparison(s): No significant change  from prior study. Prior images  reviewed side by side.    05/21/2020: EPS/Ablation CONCLUSIONS: 1.  Sinus rhythm upon presentation.   2. Intracardiac echo reveals a moderate sized left atrium with four separate pulmonary veins without evidence of pulmonary vein stenosis. 3. Minimal return of conduction along the carina between the left superior and inferior pulmonary veins.  Ablation was performed at this location.   4. Return of electrical activity along the right pulmonary veins with successful sequential electrical re isolation and anatomical encircling of right right pulmonary veins using a WACA approach 5. Additional left atrial ablation was performed with a standard box lesion created along the posterior wall of the left atrium 6. No early apparent complications.   12/14/2018: stress myoview The left ventricular ejection fraction is normal (55-65%). Nuclear stress EF: 61%. No T wave inversion was noted during stress. This is a low risk study.   Normal perfusion. LVEF 61% with normal wall motion. This is a low risk study.      Recent Labs: 03/13/2021: ALT 47; BUN 17; Creatinine, Ser 0.91; Potassium 4.8; Sodium 138; TSH 1.080  No results found for requested labs within last 8760 hours.   CrCl cannot be calculated (Patient's most recent lab result is older than the maximum 21  days allowed.).   Wt Readings from Last 3 Encounters:  06/17/21 155 lb 12.8 oz (70.7 kg)  03/13/21 155 lb 3.2 oz (70.4 kg)  12/11/20 154 lb (69.9 kg)     Other studies reviewed: Additional studies/records reviewed today include: summarized above  ASSESSMENT AND PLAN:  PPM  Intact function  No programming changes  Paroxysmal Afib CHA2DS2Vasc is 4, on Xarelto,  appropriately dosed  <0.1 % burden  Much improved on amiodarone Surveillance labs today  HTN Great home readings  4. Level of exertional capacity is unchanged for a  year or more Discussed that I thought she was doing great, and pacing herself with heavier activities probably not unrealistic for her age, though encouraged her to continue to be as active as she can   Disposition: remotes as usual, in clinic in 30mo, sooner if needed   Current medicines are reviewed at length with the patient today.  The patient did not have any concerns regarding medicines.  Venetia Night, PA-C 09/21/2021 7:22 AM     [facility] [address] [place] [address] [phone] (office)  [phone] (fax)

## 2021-09-22 NOTE — Progress Notes (Signed)
Remote pacemaker transmission.   

## 2021-09-23 ENCOUNTER — Encounter: Payer: Self-pay | Admitting: Physician Assistant

## 2021-09-23 ENCOUNTER — Other Ambulatory Visit: Payer: Self-pay

## 2021-09-23 ENCOUNTER — Ambulatory Visit: Payer: Medicare Other | Admitting: Physician Assistant

## 2021-09-23 VITALS — BP 158/84 | HR 85 | Ht 63.0 in | Wt 155.8 lb

## 2021-09-23 DIAGNOSIS — Z950 Presence of cardiac pacemaker: Secondary | ICD-10-CM

## 2021-09-23 DIAGNOSIS — I480 Paroxysmal atrial fibrillation: Secondary | ICD-10-CM

## 2021-09-23 DIAGNOSIS — I4892 Unspecified atrial flutter: Secondary | ICD-10-CM

## 2021-09-23 DIAGNOSIS — Z79899 Other long term (current) drug therapy: Secondary | ICD-10-CM | POA: Diagnosis not present

## 2021-09-23 DIAGNOSIS — I10 Essential (primary) hypertension: Secondary | ICD-10-CM | POA: Diagnosis not present

## 2021-09-23 LAB — CUP PACEART INCLINIC DEVICE CHECK
Battery Remaining Longevity: 57 mo
Battery Voltage: 2.99 V
Brady Statistic AP VP Percent: 0.37 %
Brady Statistic AP VS Percent: 98.4 %
Brady Statistic AS VP Percent: 0 %
Brady Statistic AS VS Percent: 1.23 %
Brady Statistic RA Percent Paced: 98.66 %
Brady Statistic RV Percent Paced: 0.39 %
Date Time Interrogation Session: 20230207175926
Implantable Lead Implant Date: 20180123
Implantable Lead Implant Date: 20180123
Implantable Lead Location: 753859
Implantable Lead Location: 753860
Implantable Lead Model: 5076
Implantable Lead Model: 5076
Implantable Pulse Generator Implant Date: 20180123
Lead Channel Impedance Value: 323 Ohm
Lead Channel Impedance Value: 380 Ohm
Lead Channel Impedance Value: 513 Ohm
Lead Channel Impedance Value: 627 Ohm
Lead Channel Pacing Threshold Amplitude: 0.5 V
Lead Channel Pacing Threshold Amplitude: 0.75 V
Lead Channel Pacing Threshold Pulse Width: 0.4 ms
Lead Channel Pacing Threshold Pulse Width: 0.4 ms
Lead Channel Sensing Intrinsic Amplitude: 0.625 mV
Lead Channel Sensing Intrinsic Amplitude: 1.75 mV
Lead Channel Sensing Intrinsic Amplitude: 7.5 mV
Lead Channel Sensing Intrinsic Amplitude: 9.625 mV
Lead Channel Setting Pacing Amplitude: 2 V
Lead Channel Setting Pacing Amplitude: 2.5 V
Lead Channel Setting Pacing Pulse Width: 0.4 ms
Lead Channel Setting Sensing Sensitivity: 2 mV

## 2021-09-23 NOTE — Patient Instructions (Signed)
Medication Instructions:   Your physician recommends that you continue on your current medications as directed. Please refer to the Current Medication list given to you today.   *If you need a refill on your cardiac medications before your next appointment, please call your pharmacy*   Lab Work:  CMET AND TSH  TODAY   If you have labs (blood work) drawn today and your tests are completely normal, you will receive your results only by: [place] (if you have MyChart) OR A paper copy in the mail If you have any lab test that is abnormal or we need to change your treatment, we will call you to review the results.   Testing/Procedures: NONE ORDERED  TODAY    Follow-Up: At [facility], you and your health needs are our priority.  As part of our continuing mission to provide you with exceptional heart care, we have created designated Provider Care Teams.  These Care Teams include your primary Cardiologist (physician) and Advanced Practice Providers (APPs -  Physician Assistants and Nurse Practitioners) who all work together to provide you with the care you need, when you need it.  We recommend signing up for the patient portal called "MyChart".  Sign up information is provided on this After Visit Summary.  MyChart is used to connect with patients for Virtual Visits (Telemedicine).  Patients are able to view lab/test results, encounter notes, upcoming appointments, etc.  Non-urgent messages can be sent to your provider as well.   To learn more about what you can do with MyChart, go to [email or web address].    Your next appointment:   6 month(s)  The format for your next appointment:   In Person  Provider:   Tommye Standard, PA-C    Other Instructions

## 2021-09-25 ENCOUNTER — Telehealth: Payer: Self-pay | Admitting: *Deleted

## 2021-09-25 LAB — COMPREHENSIVE METABOLIC PANEL
ALT: 33 IU/L — ABNORMAL HIGH (ref 0–32)
AST: 36 IU/L (ref 0–40)
Albumin/Globulin Ratio: 1.5 (ref 1.2–2.2)
Albumin: 4.3 g/dL (ref 3.6–4.6)
Alkaline Phosphatase: 110 IU/L (ref 44–121)
BUN/Creatinine Ratio: 17 (ref 12–28)
BUN: 17 mg/dL (ref 8–27)
Bilirubin Total: 0.5 mg/dL (ref 0.0–1.2)
CO2: 20 mmol/L (ref 20–29)
Calcium: 9.9 mg/dL (ref 8.7–10.3)
Chloride: 102 mmol/L (ref 96–106)
Creatinine, Ser: 1.02 mg/dL — ABNORMAL HIGH (ref 0.57–1.00)
Globulin, Total: 2.9 g/dL (ref 1.5–4.5)
Glucose: 94 mg/dL (ref 70–99)
Potassium: 4.4 mmol/L (ref 3.5–5.2)
Sodium: 139 mmol/L (ref 134–144)
Total Protein: 7.2 g/dL (ref 6.0–8.5)
eGFR: 55 mL/min/{1.73_m2} — ABNORMAL LOW (ref >59)

## 2021-09-25 LAB — TSH: TSH: 1.31 u[IU]/mL (ref 0.450–4.500)

## 2021-09-25 NOTE — Telephone Encounter (Signed)
Spoke with patient aware of results and verbalized understanding.

## 2021-09-25 NOTE — Telephone Encounter (Signed)
-----   Message from [place], [place] sent at 09/25/2021  7:31 AM EST ----- Labs look ok.  No new recommendations

## 2021-11-07 ENCOUNTER — Other Ambulatory Visit: Payer: Self-pay | Admitting: Internal Medicine

## 2021-11-07 DIAGNOSIS — I4891 Unspecified atrial fibrillation: Secondary | ICD-10-CM | POA: Diagnosis not present

## 2021-11-07 DIAGNOSIS — G4700 Insomnia, unspecified: Secondary | ICD-10-CM | POA: Diagnosis not present

## 2021-11-07 DIAGNOSIS — E782 Mixed hyperlipidemia: Secondary | ICD-10-CM | POA: Diagnosis not present

## 2021-11-07 DIAGNOSIS — I10 Essential (primary) hypertension: Secondary | ICD-10-CM | POA: Diagnosis not present

## 2021-11-07 NOTE — Telephone Encounter (Addendum)
Xarelto 20 mg refill request received. Pt is 81 years old, weight- 70.7 kg, Crea- 1.02 on 09/23/21 , last seen by  ?Tommye Standard, PA on 09/23/21, Diagnosis-PAF, CrCl- 48.28; will route to pharmacy, as pt's CrCl is <50 and pt qualifies for dosage reduction.  ?

## 2021-11-07 NOTE — Telephone Encounter (Signed)
Spoke w/ pt and advised her of dosage change & that I am sending in Xarelto 15 mg once daily. ?She is agreeable and appreciative of the call.  ?

## 2021-11-13 ENCOUNTER — Other Ambulatory Visit: Payer: Self-pay | Admitting: Internal Medicine

## 2021-11-17 DIAGNOSIS — I480 Paroxysmal atrial fibrillation: Secondary | ICD-10-CM | POA: Diagnosis not present

## 2021-11-17 DIAGNOSIS — E782 Mixed hyperlipidemia: Secondary | ICD-10-CM | POA: Diagnosis not present

## 2021-11-17 DIAGNOSIS — Z23 Encounter for immunization: Secondary | ICD-10-CM | POA: Diagnosis not present

## 2021-11-17 DIAGNOSIS — D6869 Other thrombophilia: Secondary | ICD-10-CM | POA: Diagnosis not present

## 2021-11-17 DIAGNOSIS — K219 Gastro-esophageal reflux disease without esophagitis: Secondary | ICD-10-CM | POA: Diagnosis not present

## 2021-11-17 DIAGNOSIS — I10 Essential (primary) hypertension: Secondary | ICD-10-CM | POA: Diagnosis not present

## 2021-11-17 DIAGNOSIS — G4700 Insomnia, unspecified: Secondary | ICD-10-CM | POA: Diagnosis not present

## 2021-12-02 DIAGNOSIS — I10 Essential (primary) hypertension: Secondary | ICD-10-CM | POA: Diagnosis not present

## 2021-12-10 ENCOUNTER — Ambulatory Visit (INDEPENDENT_AMBULATORY_CARE_PROVIDER_SITE_OTHER): Payer: Medicare Other

## 2021-12-10 DIAGNOSIS — I513 Intracardiac thrombosis, not elsewhere classified: Secondary | ICD-10-CM

## 2021-12-11 LAB — CUP PACEART REMOTE DEVICE CHECK
Battery Remaining Longevity: 52 mo
Battery Voltage: 2.98 V
Brady Statistic AP VP Percent: 0.53 %
Brady Statistic AP VS Percent: 96.31 %
Brady Statistic AS VP Percent: 0 %
Brady Statistic AS VS Percent: 3.15 %
Brady Statistic RA Percent Paced: 96.59 %
Brady Statistic RV Percent Paced: 0.62 %
Date Time Interrogation Session: 20230427102909
Implantable Lead Implant Date: 20180123
Implantable Lead Implant Date: 20180123
Implantable Lead Location: 753859
Implantable Lead Location: 753860
Implantable Lead Model: 5076
Implantable Lead Model: 5076
Implantable Pulse Generator Implant Date: 20180123
Lead Channel Impedance Value: 285 Ohm
Lead Channel Impedance Value: 323 Ohm
Lead Channel Impedance Value: 437 Ohm
Lead Channel Impedance Value: 551 Ohm
Lead Channel Pacing Threshold Amplitude: 0.625 V
Lead Channel Pacing Threshold Amplitude: 0.75 V
Lead Channel Pacing Threshold Pulse Width: 0.4 ms
Lead Channel Pacing Threshold Pulse Width: 0.4 ms
Lead Channel Sensing Intrinsic Amplitude: 0.625 mV
Lead Channel Sensing Intrinsic Amplitude: 0.625 mV
Lead Channel Sensing Intrinsic Amplitude: 8.125 mV
Lead Channel Sensing Intrinsic Amplitude: 8.125 mV
Lead Channel Setting Pacing Amplitude: 2 V
Lead Channel Setting Pacing Amplitude: 2.5 V
Lead Channel Setting Pacing Pulse Width: 0.4 ms
Lead Channel Setting Sensing Sensitivity: 2 mV

## 2021-12-15 DIAGNOSIS — M25562 Pain in left knee: Secondary | ICD-10-CM | POA: Diagnosis not present

## 2021-12-15 DIAGNOSIS — M170 Bilateral primary osteoarthritis of knee: Secondary | ICD-10-CM | POA: Diagnosis not present

## 2021-12-15 DIAGNOSIS — M1712 Unilateral primary osteoarthritis, left knee: Secondary | ICD-10-CM | POA: Diagnosis not present

## 2021-12-25 NOTE — Progress Notes (Signed)
Remote pacemaker transmission.   

## 2022-02-01 ENCOUNTER — Other Ambulatory Visit: Payer: Self-pay | Admitting: Cardiology

## 2022-02-18 DIAGNOSIS — M1712 Unilateral primary osteoarthritis, left knee: Secondary | ICD-10-CM | POA: Diagnosis not present

## 2022-02-25 DIAGNOSIS — M1712 Unilateral primary osteoarthritis, left knee: Secondary | ICD-10-CM | POA: Diagnosis not present

## 2022-03-04 DIAGNOSIS — M1712 Unilateral primary osteoarthritis, left knee: Secondary | ICD-10-CM | POA: Diagnosis not present

## 2022-03-05 DIAGNOSIS — R634 Abnormal weight loss: Secondary | ICD-10-CM | POA: Diagnosis not present

## 2022-03-05 DIAGNOSIS — K649 Unspecified hemorrhoids: Secondary | ICD-10-CM | POA: Diagnosis not present

## 2022-03-05 DIAGNOSIS — Z79899 Other long term (current) drug therapy: Secondary | ICD-10-CM | POA: Diagnosis not present

## 2022-03-05 DIAGNOSIS — R109 Unspecified abdominal pain: Secondary | ICD-10-CM | POA: Diagnosis not present

## 2022-03-05 DIAGNOSIS — R5383 Other fatigue: Secondary | ICD-10-CM | POA: Diagnosis not present

## 2022-03-05 DIAGNOSIS — K921 Melena: Secondary | ICD-10-CM | POA: Diagnosis not present

## 2022-03-11 ENCOUNTER — Ambulatory Visit (INDEPENDENT_AMBULATORY_CARE_PROVIDER_SITE_OTHER): Payer: Medicare Other

## 2022-03-11 DIAGNOSIS — I4892 Unspecified atrial flutter: Secondary | ICD-10-CM

## 2022-03-12 LAB — CUP PACEART REMOTE DEVICE CHECK
Battery Remaining Longevity: 53 mo
Battery Voltage: 2.98 V
Brady Statistic AP VP Percent: 0.87 %
Brady Statistic AP VS Percent: 96.93 %
Brady Statistic AS VP Percent: 0.02 %
Brady Statistic AS VS Percent: 2.19 %
Brady Statistic RA Percent Paced: 97.56 %
Brady Statistic RV Percent Paced: 0.93 %
Date Time Interrogation Session: 20230726173627
Implantable Lead Implant Date: 20180123
Implantable Lead Implant Date: 20180123
Implantable Lead Location: 753859
Implantable Lead Location: 753860
Implantable Lead Model: 5076
Implantable Lead Model: 5076
Implantable Pulse Generator Implant Date: 20180123
Lead Channel Impedance Value: 304 Ohm
Lead Channel Impedance Value: 342 Ohm
Lead Channel Impedance Value: 513 Ohm
Lead Channel Impedance Value: 608 Ohm
Lead Channel Pacing Threshold Amplitude: 0.5 V
Lead Channel Pacing Threshold Amplitude: 0.625 V
Lead Channel Pacing Threshold Pulse Width: 0.4 ms
Lead Channel Pacing Threshold Pulse Width: 0.4 ms
Lead Channel Sensing Intrinsic Amplitude: 1.25 mV
Lead Channel Sensing Intrinsic Amplitude: 1.25 mV
Lead Channel Sensing Intrinsic Amplitude: 8.375 mV
Lead Channel Sensing Intrinsic Amplitude: 8.375 mV
Lead Channel Setting Pacing Amplitude: 2 V
Lead Channel Setting Pacing Amplitude: 2.5 V
Lead Channel Setting Pacing Pulse Width: 0.4 ms
Lead Channel Setting Sensing Sensitivity: 2 mV

## 2022-03-18 DIAGNOSIS — K5900 Constipation, unspecified: Secondary | ICD-10-CM | POA: Diagnosis not present

## 2022-03-18 DIAGNOSIS — K625 Hemorrhage of anus and rectum: Secondary | ICD-10-CM | POA: Diagnosis not present

## 2022-03-18 DIAGNOSIS — R5383 Other fatigue: Secondary | ICD-10-CM | POA: Diagnosis not present

## 2022-03-18 DIAGNOSIS — K649 Unspecified hemorrhoids: Secondary | ICD-10-CM | POA: Diagnosis not present

## 2022-04-06 NOTE — Progress Notes (Signed)
Remote pacemaker transmission.   

## 2022-04-15 DIAGNOSIS — M1712 Unilateral primary osteoarthritis, left knee: Secondary | ICD-10-CM | POA: Diagnosis not present

## 2022-05-07 ENCOUNTER — Other Ambulatory Visit (HOSPITAL_COMMUNITY): Payer: Self-pay | Admitting: Internal Medicine

## 2022-05-07 DIAGNOSIS — I480 Paroxysmal atrial fibrillation: Secondary | ICD-10-CM

## 2022-05-07 NOTE — Telephone Encounter (Addendum)
Prescription refill request for Xarelto received.  Indication: Afib  Last office visit: 08/23/21 Charlcie Cradle) Weight: 70.7kg Age: 81 Scr: 1.02 (09/23/21)  CrCl:  47.45m/min  Appropriate dose and refill sent to requested pharmacy.

## 2022-06-10 ENCOUNTER — Ambulatory Visit (INDEPENDENT_AMBULATORY_CARE_PROVIDER_SITE_OTHER): Payer: Medicare Other

## 2022-06-10 DIAGNOSIS — I480 Paroxysmal atrial fibrillation: Secondary | ICD-10-CM | POA: Diagnosis not present

## 2022-06-11 LAB — CUP PACEART REMOTE DEVICE CHECK
Battery Remaining Longevity: 45 mo
Battery Voltage: 2.98 V
Brady Statistic AP VP Percent: 1.84 %
Brady Statistic AP VS Percent: 97.35 %
Brady Statistic AS VP Percent: 0.07 %
Brady Statistic AS VS Percent: 0.74 %
Brady Statistic RA Percent Paced: 98.98 %
Brady Statistic RV Percent Paced: 1.94 %
Date Time Interrogation Session: 20231025171053
Implantable Lead Connection Status: 753985
Implantable Lead Connection Status: 753985
Implantable Lead Implant Date: 20180123
Implantable Lead Implant Date: 20180123
Implantable Lead Location: 753859
Implantable Lead Location: 753860
Implantable Lead Model: 5076
Implantable Lead Model: 5076
Implantable Pulse Generator Implant Date: 20180123
Lead Channel Impedance Value: 285 Ohm
Lead Channel Impedance Value: 342 Ohm
Lead Channel Impedance Value: 437 Ohm
Lead Channel Impedance Value: 532 Ohm
Lead Channel Pacing Threshold Amplitude: 0.625 V
Lead Channel Pacing Threshold Amplitude: 0.625 V
Lead Channel Pacing Threshold Pulse Width: 0.4 ms
Lead Channel Pacing Threshold Pulse Width: 0.4 ms
Lead Channel Sensing Intrinsic Amplitude: 1.25 mV
Lead Channel Sensing Intrinsic Amplitude: 1.25 mV
Lead Channel Sensing Intrinsic Amplitude: 7.375 mV
Lead Channel Sensing Intrinsic Amplitude: 7.375 mV
Lead Channel Setting Pacing Amplitude: 2 V
Lead Channel Setting Pacing Amplitude: 2.5 V
Lead Channel Setting Pacing Pulse Width: 0.4 ms
Lead Channel Setting Sensing Sensitivity: 2 mV
Zone Setting Status: 755011
Zone Setting Status: 755011

## 2022-06-15 NOTE — Progress Notes (Unsigned)
Cardiology Office Note Date:  06/17/2022  Patient ID:  Monica [name], Monica [name] 31-Aug-1940, MRN [id number] PCP:  Carol Ada, MD  Cardiologist: Dr. Radford Pax Electrophysiologist: Dr. Rayann Heman    Chief Complaint:   6 mo  History of Present Illness: Monica [name] is a 81 y.o. female with history of symptomatic bradycardia w/PPM, AFib, HTN, HLD  She saw Dr. Rayann Heman, July 2022, AF burden was done to 7.9% (from 28%) planned to continue amiodarone and wean off metoprolol with plans to see again in 3 mo and continue to wean off BB.  Labs updated  I saw her 06/17/21 She is feeling better on reduced lopressor dose. Reports home numbers 110's-120's/60's-70's, and has not felt any AFib since starting amiodarone She does not have all the energy she would like, but feels well otherwise. No CP, no SOB, no near syncope or syncope She struggles with constipation and hemorrhoids, has some bleeding with BM but none otherwise. She was feeling well, did not want to wean BB, no changes were made  I saw her Feb 2023 She is ding quite well Able to do her ADL's,  cares for herself and her home independently though wishes she had better stamina. She gives an example that when raking her leaves she has to take breaks getting winded, SOB, can work longer with the blower, but enjoys raking as well. Activities that require both upper/lower body exertion tire her quicker then routine activities. No CP, no rest SOB No near syncope or syncope. She does not think she has had any AFib and is very happy about that. Her current level of exertional capacity is unchanged for over a year, but was hoping with managing the Afib, she would have better stamina, though very thankful without the AFIb feeling much improved overall. No new bleeding or signs of bleeding, some blood with BM, unchanged Encouraged her to stay active though to pace herself with the heavier activities. Low AF burden, planned for amio labs  TODAY She  is doing well.  Has L knee pain that limits her but denies any CP/SOB or difficulties otherwise with her ADLs No near syncope or syncope. 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  AFib Hx PVI ablation Oct 2013, Dec 2015, 05/21/2020  AAD Hx Sotalol started 2018 >> stopped April 2022 with increasing AF burden Amiodarone started April 2022 >> current   Past Medical History:  Diagnosis Date   Adrenal adenoma 6/11   lt on ct 6/11   Atrial fibrillation ([place]) 01/21/2011   Atrial flutter ([place]) 05/16/2012   Basal cell carcinoma 11/25/2020   nod- Right ala nasi (MOHS)   BCC (basal cell carcinoma of skin) 05/22/2013   Right Upper Lip (curet, cautery, and excision)   BCC (basal cell carcinoma of skin) 08/24/2013   Right Upper Lip(Sclerosis + margin) (MOH's)   Bradycardia 01/21/2011   Coronary artery calcification seen on CT scan 11/01/2018   GERD (gastroesophageal reflux disease)    Hypercholesteremia    Hypertension    Kidney stone 6/11   rt. on ct 6/11   Nodular basal cell carcinoma (BCC) 06/13/2019   Left [place] (curet, cuatery and excision)   Osteoporosis    Palpitations 05/30/2014   Paroxysmal atrial fibrillation ([place])    a. PVI 10/13 b. PVI 12/15   Superficial nodular basal cell carcinoma (  BCC) 06/13/2019   Above Left Brow (curet and 5FU)   Superficial nodular basal cell carcinoma (BCC) 06/13/2019   Right Cheekbone (curet and 5FU)    Past Surgical History:  Procedure Laterality Date   ATRIAL FIBRILLATION ABLATION N/A 06/07/2012   PVI and CTI ablation by Dr Rayann Heman   ATRIAL FIBRILLATION ABLATION N/A 07/31/2014   PVI by Dr Rayann Heman; initial ablation 2013   [place] N/A 05/21/2020   Procedure: [place];  Surgeon: Thompson Grayer, MD;  Location: [place] [facility];  Service: Cardiovascular;   Laterality: N/A;   CARDIOVERSION  05/06/2012   Procedure: CARDIOVERSION;  Surgeon: Sueanne Margarita, MD;  Location[facility];  Service: Cardiovascular;  Laterality: N/A;  h/p in file drawer   CARDIOVERSION N/A 10/06/2017   Procedure: CARDIOVERSION;  Surgeon: Skeet Latch, MD;  Location: [facility] ENDOSCOPY;  Service: Cardiovascular;  Laterality: N/A;   CARDIOVERSION N/A 03/28/2018   Procedure: CARDIOVERSION;  Surgeon: Dorothy Spark, MD;  Location: [facility] ENDOSCOPY;  Service: Cardiovascular;  Laterality: N/A;   CARDIOVERSION N/A 02/09/2019   Procedure: CARDIOVERSION;  Surgeon: Pixie Casino, MD;  Location: [facility]COPY;  Service: Cardiovascular;  Laterality: N/A;   CESAREAN SECTION  1978-1979   x-2   CHOLECYSTECTOMY  1989   ELECTROPHYSIOLOGIC STUDY N/A 09/08/2016   Procedure: Cardioversion;  Surgeon: Thompson Grayer, MD;  Location: [place] [facility];  Service: Cardiovascular;  Laterality: N/A;   EP IMPLANTABLE DEVICE N/A 09/08/2016   Procedure: Loop Recorder Removal;  Surgeon: Thompson Grayer, MD;  Location: [place] [facility];  Service: Cardiovascular;  Laterality: N/A;   EP IMPLANTABLE DEVICE N/A 09/08/2016   Procedure: Pacemaker Implant;  Surgeon: Thompson Grayer, MD;  Location: [place] [facility];  Service: Cardiovascular;  Laterality: N/A;   Implantable loop recorder implantation  05/30/14   Medtronic Reveal LINQ implanted by Dr Rayann Heman with RIO II protocol (ambulatory setting)   TEE WITHOUT CARDIOVERSION  06/06/2012   Procedure: TRANSESOPHAGEAL ECHOCARDIOGRAM (TEE);  Surgeon: Jettie Booze, MD;  Location: [place];  Service: Cardiovascular;  Laterality: N/A;   TEE WITHOUT CARDIOVERSION N/A 07/30/2014   Procedure: TRANSESOPHAGEAL ECHOCARDIOGRAM (TEE);  Surgeon: Larey Dresser, MD;  Location: [place];  Service: Cardiovascular;  Laterality: N/A;   TEE WITHOUT CARDIOVERSION N/A 10/06/2017   Procedure: TRANSESOPHAGEAL ECHOCARDIOGRAM (TEE);  Surgeon: Skeet Latch, MD;  Location: [place];  Service: Cardiovascular;  Laterality: N/A;   TEE WITHOUT CARDIOVERSION N/A 03/28/2018   Procedure: TRANSESOPHAGEAL ECHOCARDIOGRAM (TEE);  Surgeon: Dorothy Spark, MD;  Location: [facility] ENDOSCOPY;  Service: Cardiovascular;  Laterality: N/A;   TEE WITHOUT CARDIOVERSION N/A 09/30/2018   Procedure: TRANSESOPHAGEAL ECHOCARDIOGRAM (TEE);  Surgeon: Elouise Munroe, MD;  Location: [facility] ENDOSCOPY;  Service: Cardiovascular;  Laterality: N/A;   TEE WITHOUT CARDIOVERSION N/A 02/09/2019   Procedure: TRANSESOPHAGEAL ECHOCARDIOGRAM (TEE);  Surgeon: Pixie Casino, MD;  Location: [facility] ENDOSCOPY;  Service: Cardiovascular;  Laterality: N/A;    Current Outpatient Medications  Medication Sig Dispense Refill   acetaminophen (TYLENOL) 500 MG tablet Take 250-500 mg by mouth every 6 (six) hours as needed for moderate pain.     alum & mag hydroxide-simeth (MAALOX/MYLANTA) 200-200-20 MG/5ML suspension Take 15 mLs by mouth every 6 (six) hours as needed for indigestion or heartburn.     amiodarone (PACERONE) 200 MG tablet TAKE ONE TABLET BY MOUTH ONCE DAILY 90 tablet 1   amLODipine (NORVASC) 2.5 MG tablet TAKE ONE TABLET BY MOUTH DAILY 180 tablet 3   calcium carbonate (OS-CAL) 600  MG TABS tablet Take 600 mg by mouth 2 (two) times daily with a meal.      calcium carbonate (TUMS - DOSED IN MG ELEMENTAL CALCIUM) 500 MG chewable tablet Chew 1-2 tablets by mouth 3 (three) times daily as needed for indigestion or heartburn.     Cholecalciferol (VITAMIN D) 50 MCG (2000 UT) tablet Take 2,000 Units by mouth every evening.      Ferrous Gluconate (IRON 27 PO) Take 1 tablet by mouth daily.     metoprolol tartrate (LOPRESSOR) 25 MG tablet Take 1 tablet (25 mg total) by mouth 2 (two) times daily. 180 tablet 1   pantoprazole (PROTONIX) 40 MG tablet Take 40 mg by mouth daily before breakfast.      Polyethyl Glycol-Propyl Glycol (SYSTANE ULTRA PF OP) Place 1 drop into both eyes daily as needed (dry eye).      polyethylene glycol  (MIRALAX / GLYCOLAX) 17 g packet Take 17 g by mouth daily as needed for mild constipation or moderate constipation.     Rivaroxaban (XARELTO) 15 MG TABS tablet TAKE ONE TABLET BY MOUTH DAILY WITH SUPPER 30 tablet 5   rosuvastatin (CRESTOR) 20 MG tablet TAKE ONE TABLET BY MOUTH ONCE DAILY 90 tablet 1   traZODone (DESYREL) 50 MG tablet Take 50-75 mg by mouth at bedtime.     Wheat Dextrin (BENEFIBER DRINK MIX PO) Take 1 Applicatorful by mouth daily.     No current facility-administered medications for this visit.    Allergies:   Morphine and related and Codeine   Social History:  The patient  reports that she has never smoked. She has never used smokeless tobacco. She reports that she does not drink alcohol and does not use drugs.   Family History:  The patient's family history includes Atrial fibrillation in her sister; CAD in her father; Heart attack in her brother and father; Hip fracture in her mother; Lung cancer in her brother; Other in her brother; Ovarian cancer in her sister; Thyroid cancer in her sister.  ROS:  Please see the history of present illness.    All other systems are reviewed and otherwise negative.   PHYSICAL EXAM:  VS:  BP (!) 160/90 (BP Location: Right Arm, Patient Position: Sitting, Cuff Size: Normal)   Pulse 74   Ht '5\' 3"'$  (1.6 m)   Wt 151 lb (68.5 kg)   SpO2 98%   BMI 26.75 kg/m  BMI: Body mass index is 26.75 kg/m. Well nourished, well developed, in no acute distress HEENT: normocephalic, atraumatic Neck: no JVD, carotid bruits or masses Cardiac:  RRR; no significant murmurs, no rubs, or gallops Lungs: CTA b/l, no wheezing, rhonchi or rales Abd: soft, nontender MS: no deformity or atrophy Ext: no edema Skin: warm and dry, no rash Neuro:  No gross deficits appreciated Psych: euthymic mood, full affect  PPM site is stable, no tethering or discomfort   EKG:  not done today  Device interrogation done today and reviewed by myself:  Battery and led  measurements are good + AMS, burden < 0.1%    04/24/2020: TTE IMPRESSIONS   1. Left ventricular ejection fraction, by estimation, is 60 to 65%. The  left ventricle has normal function. The left ventricle has no regional  wall motion abnormalities. Left ventricular diastolic parameters are  consistent with Grade II diastolic  dysfunction (pseudonormalization).   2. Right ventricular systolic function is normal. The right ventricular  size is mildly enlarged. There is normal pulmonary artery systolic  pressure.  The estimated right ventricular systolic pressure is 24.5 mmHg.   3. Left atrial size was moderately dilated.   4. Right atrial size was mildly dilated.   5. The mitral valve is normal in structure. No evidence of mitral valve  regurgitation. No evidence of mitral stenosis.   6. The aortic valve is tricuspid. Aortic valve regurgitation is trivial.  Mild to moderate aortic valve sclerosis/calcification is present, without  any evidence of aortic stenosis. Aortic valve mean gradient measures 5.0  mmHg. Aortic valve Vmax measures  1.54 m/s.   7. The inferior vena cava is normal in size with greater than 50%  respiratory variability, suggesting right atrial pressure of 3 mmHg.   Comparison(s): No significant change from prior study. Prior images  reviewed side by side.    05/21/2020: EPS/Ablation CONCLUSIONS: 1.  Sinus rhythm upon presentation.   2. Intracardiac echo reveals a moderate sized left atrium with four separate pulmonary veins without evidence of pulmonary vein stenosis. 3. Minimal return of conduction along the carina between the left superior and inferior pulmonary veins.  Ablation was performed at this location.   4. Return of electrical activity along the right pulmonary veins with successful sequential electrical re isolation and anatomical encircling of right right pulmonary veins using a WACA approach 5. Additional left atrial ablation was performed with a  standard box lesion created along the posterior wall of the left atrium 6. No early apparent complications.   12/14/2018: stress myoview The left ventricular ejection fraction is normal (55-65%). Nuclear stress EF: 61%. No T wave inversion was noted during stress. This is a low risk study.   Normal perfusion. LVEF 61% with normal wall motion. This is a low risk study.      Recent Labs: 09/23/2021: ALT 33; BUN 17; Creatinine, Ser 1.02; Potassium 4.4; Sodium 139; TSH 1.310  No results found for requested labs within last 365 days.   CrCl cannot be calculated (Patient's most recent lab result is older than the maximum 21 days allowed.).   Wt Readings from Last 3 Encounters:  06/17/22 151 lb (68.5 kg)  09/23/21 155 lb 12.8 oz (70.7 kg)  06/17/21 155 lb 12.8 oz (70.7 kg)     Other studies reviewed: Additional studies/records reviewed today include: summarized above  ASSESSMENT AND PLAN:  PPM  Intact function  No programming changes  Paroxysmal Afib CHA2DS2Vasc is 4, on Xarelto, appropriately dosed at '15mg'$  daily  <0.1 % burden on amiodarone She had annual labs yesterday with her PMD< she did get the TSH done, and that resulted already "ok" Will request her labs from her PMD  HTN Great home readings This is known for her to have high readings in the office  Discussed need for new EP MD Will transition to Dr. Curt Bears   Disposition: remotes as usual, in clinic in a year, sooner if needed   Current medicines are reviewed at length with the patient today.  The patient did not have any concerns regarding medicines.  Venetia Night, PA-C 06/17/2022 12:38 PM     [address] [place] [address] [phone] (office)  [phone] (fax)

## 2022-06-16 DIAGNOSIS — Z0000 Encounter for general adult medical examination without abnormal findings: Secondary | ICD-10-CM | POA: Diagnosis not present

## 2022-06-16 DIAGNOSIS — D6869 Other thrombophilia: Secondary | ICD-10-CM | POA: Diagnosis not present

## 2022-06-16 DIAGNOSIS — E782 Mixed hyperlipidemia: Secondary | ICD-10-CM | POA: Diagnosis not present

## 2022-06-16 DIAGNOSIS — Z23 Encounter for immunization: Secondary | ICD-10-CM | POA: Diagnosis not present

## 2022-06-16 DIAGNOSIS — E559 Vitamin D deficiency, unspecified: Secondary | ICD-10-CM | POA: Diagnosis not present

## 2022-06-16 DIAGNOSIS — I10 Essential (primary) hypertension: Secondary | ICD-10-CM | POA: Diagnosis not present

## 2022-06-16 DIAGNOSIS — I480 Paroxysmal atrial fibrillation: Secondary | ICD-10-CM | POA: Diagnosis not present

## 2022-06-16 DIAGNOSIS — G4700 Insomnia, unspecified: Secondary | ICD-10-CM | POA: Diagnosis not present

## 2022-06-17 ENCOUNTER — Ambulatory Visit: Payer: Medicare Other | Attending: Physician Assistant | Admitting: Physician Assistant

## 2022-06-17 ENCOUNTER — Encounter: Payer: Self-pay | Admitting: Physician Assistant

## 2022-06-17 VITALS — BP 152/74 | HR 74 | Ht 63.0 in | Wt 151.0 lb

## 2022-06-17 DIAGNOSIS — D6869 Other thrombophilia: Secondary | ICD-10-CM | POA: Diagnosis not present

## 2022-06-17 DIAGNOSIS — I480 Paroxysmal atrial fibrillation: Secondary | ICD-10-CM

## 2022-06-17 DIAGNOSIS — Z950 Presence of cardiac pacemaker: Secondary | ICD-10-CM

## 2022-06-17 DIAGNOSIS — I10 Essential (primary) hypertension: Secondary | ICD-10-CM

## 2022-06-17 LAB — CUP PACEART INCLINIC DEVICE CHECK
Battery Remaining Longevity: 45 mo
Battery Voltage: 2.98 V
Brady Statistic AP VP Percent: 1.11 %
Brady Statistic AP VS Percent: 96.93 %
Brady Statistic AS VP Percent: 0.03 %
Brady Statistic AS VS Percent: 1.93 %
Brady Statistic RA Percent Paced: 97.81 %
Brady Statistic RV Percent Paced: 1.19 %
Date Time Interrogation Session: 20231101124615
Implantable Lead Connection Status: 753985
Implantable Lead Connection Status: 753985
Implantable Lead Implant Date: 20180123
Implantable Lead Implant Date: 20180123
Implantable Lead Location: 753859
Implantable Lead Location: 753860
Implantable Lead Model: 5076
Implantable Lead Model: 5076
Implantable Pulse Generator Implant Date: 20180123
Lead Channel Impedance Value: 323 Ohm
Lead Channel Impedance Value: 399 Ohm
Lead Channel Impedance Value: 532 Ohm
Lead Channel Impedance Value: 646 Ohm
Lead Channel Pacing Threshold Amplitude: 0.625 V
Lead Channel Pacing Threshold Amplitude: 0.625 V
Lead Channel Pacing Threshold Pulse Width: 0.4 ms
Lead Channel Pacing Threshold Pulse Width: 0.4 ms
Lead Channel Sensing Intrinsic Amplitude: 1.25 mV
Lead Channel Sensing Intrinsic Amplitude: 1.625 mV
Lead Channel Sensing Intrinsic Amplitude: 9.125 mV
Lead Channel Sensing Intrinsic Amplitude: 9.875 mV
Lead Channel Setting Pacing Amplitude: 2 V
Lead Channel Setting Pacing Amplitude: 2.5 V
Lead Channel Setting Pacing Pulse Width: 0.4 ms
Lead Channel Setting Sensing Sensitivity: 2 mV
Zone Setting Status: 755011
Zone Setting Status: 755011

## 2022-06-17 NOTE — Patient Instructions (Addendum)
Medication Instructions:   Your physician recommends that you continue on your current medications as directed. Please refer to the Current Medication list given to you today.  *If you need a refill on your cardiac medications before your next appointment, please call your pharmacy*   Lab Work:  [place]   If you have labs (blood work) drawn today and your tests are completely normal, you will receive your results only by: [place] (if you have MyChart) OR A paper copy in the mail If you have any lab test that is abnormal or we need to change your treatment, we will call you to review the results.   Testing/Procedures: NONE ORDERED  TODAY    Follow-Up: At [facility], you and your health needs are our priority.  As part of our continuing mission to provide you with exceptional heart care, we have created designated Provider Care Teams.  These Care Teams include your primary Cardiologist (physician) and Advanced Practice Providers (APPs -  Physician Assistants and Nurse Practitioners) who all work together to provide you with the care you need, when you need it.  We recommend signing up for the patient portal called "MyChart".  Sign up information is provided on this After Visit Summary.  MyChart is used to connect with patients for Virtual Visits (Telemedicine).  Patients are able to view lab/test results, encounter notes, upcoming appointments, etc.  Non-urgent messages can be sent to your provider as well.   To learn more about what you can do with MyChart, go to [email or web address].    Your next appointment:   1 year(s)  The format for your next appointment:   In Person  Provider:   You may see Dr. Caryl Comes  or one of the following Advanced Practice Providers on your designated Care Team:   Tommye Standard, [place] Legrand Como "Jonni Sanger" Chalmers Cater, [place]   Other Instructions   Important Information About [place]

## 2022-06-23 DIAGNOSIS — K5900 Constipation, unspecified: Secondary | ICD-10-CM | POA: Diagnosis not present

## 2022-06-23 DIAGNOSIS — D509 Iron deficiency anemia, unspecified: Secondary | ICD-10-CM | POA: Diagnosis not present

## 2022-06-23 NOTE — Progress Notes (Signed)
Remote pacemaker transmission.   

## 2022-08-01 ENCOUNTER — Other Ambulatory Visit: Payer: Self-pay | Admitting: Cardiology

## 2022-09-09 ENCOUNTER — Ambulatory Visit: Payer: Medicare Other | Attending: Cardiology

## 2022-09-09 DIAGNOSIS — I4892 Unspecified atrial flutter: Secondary | ICD-10-CM

## 2022-09-09 LAB — CUP PACEART REMOTE DEVICE CHECK
Battery Remaining Longevity: 46 mo
Battery Voltage: 2.97 V
Brady Statistic AP VP Percent: 1.96 %
Brady Statistic AP VS Percent: 97.34 %
Brady Statistic AS VP Percent: 0.09 %
Brady Statistic AS VS Percent: 0.61 %
Brady Statistic RA Percent Paced: 99.03 %
Brady Statistic RV Percent Paced: 2.08 %
Date Time Interrogation Session: 20240124145930
Implantable Lead Connection Status: 753985
Implantable Lead Connection Status: 753985
Implantable Lead Implant Date: 20180123
Implantable Lead Implant Date: 20180123
Implantable Lead Location: 753859
Implantable Lead Location: 753860
Implantable Lead Model: 5076
Implantable Lead Model: 5076
Implantable Pulse Generator Implant Date: 20180123
Lead Channel Impedance Value: 304 Ohm
Lead Channel Impedance Value: 342 Ohm
Lead Channel Impedance Value: 437 Ohm
Lead Channel Impedance Value: 532 Ohm
Lead Channel Pacing Threshold Amplitude: 0.5 V
Lead Channel Pacing Threshold Amplitude: 0.75 V
Lead Channel Pacing Threshold Pulse Width: 0.4 ms
Lead Channel Pacing Threshold Pulse Width: 0.4 ms
Lead Channel Sensing Intrinsic Amplitude: 2.625 mV
Lead Channel Sensing Intrinsic Amplitude: 2.625 mV
Lead Channel Sensing Intrinsic Amplitude: 7.875 mV
Lead Channel Sensing Intrinsic Amplitude: 7.875 mV
Lead Channel Setting Pacing Amplitude: 2 V
Lead Channel Setting Pacing Amplitude: 2.5 V
Lead Channel Setting Pacing Pulse Width: 0.4 ms
Lead Channel Setting Sensing Sensitivity: 2 mV
Zone Setting Status: 755011
Zone Setting Status: 755011

## 2022-09-24 ENCOUNTER — Encounter (HOSPITAL_COMMUNITY): Payer: Self-pay | Admitting: *Deleted

## 2022-09-28 DIAGNOSIS — D509 Iron deficiency anemia, unspecified: Secondary | ICD-10-CM | POA: Diagnosis not present

## 2022-10-02 NOTE — Progress Notes (Signed)
Remote pacemaker transmission.   

## 2022-10-14 DIAGNOSIS — M1712 Unilateral primary osteoarthritis, left knee: Secondary | ICD-10-CM | POA: Diagnosis not present

## 2022-11-01 ENCOUNTER — Other Ambulatory Visit (HOSPITAL_COMMUNITY): Payer: Self-pay | Admitting: Cardiology

## 2022-11-01 ENCOUNTER — Other Ambulatory Visit (HOSPITAL_COMMUNITY): Payer: Self-pay | Admitting: Physician Assistant

## 2022-11-01 DIAGNOSIS — I480 Paroxysmal atrial fibrillation: Secondary | ICD-10-CM

## 2022-11-02 NOTE — Telephone Encounter (Signed)
Pt last saw Tommye Standard, [place] on 06/17/22, last labs 06/16/22 Creat 0.94 at [facility] per [facility], age 82, weight 68.5kg, CrCl 49.9, based on CrCl pt is on appropriate dosage of Xarelto 15mg  QD for afib.  Will refill rx.

## 2022-11-11 DIAGNOSIS — M25662 Stiffness of left knee, not elsewhere classified: Secondary | ICD-10-CM | POA: Diagnosis not present

## 2022-11-11 DIAGNOSIS — M25562 Pain in left knee: Secondary | ICD-10-CM | POA: Diagnosis not present

## 2022-11-11 DIAGNOSIS — M1712 Unilateral primary osteoarthritis, left knee: Secondary | ICD-10-CM | POA: Diagnosis not present

## 2022-11-12 DIAGNOSIS — L989 Disorder of the skin and subcutaneous tissue, unspecified: Secondary | ICD-10-CM | POA: Diagnosis not present

## 2022-11-12 DIAGNOSIS — C44319 Basal cell carcinoma of skin of other parts of face: Secondary | ICD-10-CM | POA: Diagnosis not present

## 2022-11-12 DIAGNOSIS — D485 Neoplasm of uncertain behavior of skin: Secondary | ICD-10-CM | POA: Diagnosis not present

## 2022-12-02 DIAGNOSIS — G4700 Insomnia, unspecified: Secondary | ICD-10-CM | POA: Diagnosis not present

## 2022-12-02 DIAGNOSIS — D6869 Other thrombophilia: Secondary | ICD-10-CM | POA: Diagnosis not present

## 2022-12-02 DIAGNOSIS — Z7901 Long term (current) use of anticoagulants: Secondary | ICD-10-CM | POA: Diagnosis not present

## 2022-12-02 DIAGNOSIS — I480 Paroxysmal atrial fibrillation: Secondary | ICD-10-CM | POA: Diagnosis not present

## 2022-12-02 DIAGNOSIS — E782 Mixed hyperlipidemia: Secondary | ICD-10-CM | POA: Diagnosis not present

## 2022-12-02 DIAGNOSIS — I10 Essential (primary) hypertension: Secondary | ICD-10-CM | POA: Diagnosis not present

## 2022-12-02 DIAGNOSIS — I700 Atherosclerosis of aorta: Secondary | ICD-10-CM | POA: Diagnosis not present

## 2022-12-09 ENCOUNTER — Ambulatory Visit (INDEPENDENT_AMBULATORY_CARE_PROVIDER_SITE_OTHER): Payer: Medicare Other

## 2022-12-09 DIAGNOSIS — I480 Paroxysmal atrial fibrillation: Secondary | ICD-10-CM | POA: Diagnosis not present

## 2022-12-09 LAB — CUP PACEART REMOTE DEVICE CHECK
Battery Remaining Longevity: 44 mo
Battery Voltage: 2.97 V
Brady Statistic AP VP Percent: 2.49 %
Brady Statistic AP VS Percent: 94.77 %
Brady Statistic AS VP Percent: 0.8 %
Brady Statistic AS VS Percent: 1.94 %
Brady Statistic RA Percent Paced: 96.74 %
Brady Statistic RV Percent Paced: 3.37 %
Date Time Interrogation Session: 20240424090728
Implantable Lead Connection Status: 753985
Implantable Lead Connection Status: 753985
Implantable Lead Implant Date: 20180123
Implantable Lead Implant Date: 20180123
Implantable Lead Location: 753859
Implantable Lead Location: 753860
Implantable Lead Model: 5076
Implantable Lead Model: 5076
Implantable Pulse Generator Implant Date: 20180123
Lead Channel Impedance Value: 304 Ohm
Lead Channel Impedance Value: 361 Ohm
Lead Channel Impedance Value: 513 Ohm
Lead Channel Impedance Value: 608 Ohm
Lead Channel Pacing Threshold Amplitude: 0.5 V
Lead Channel Pacing Threshold Amplitude: 0.75 V
Lead Channel Pacing Threshold Pulse Width: 0.4 ms
Lead Channel Pacing Threshold Pulse Width: 0.4 ms
Lead Channel Sensing Intrinsic Amplitude: 2.5 mV
Lead Channel Sensing Intrinsic Amplitude: 2.5 mV
Lead Channel Sensing Intrinsic Amplitude: 8.625 mV
Lead Channel Sensing Intrinsic Amplitude: 8.625 mV
Lead Channel Setting Pacing Amplitude: 2 V
Lead Channel Setting Pacing Amplitude: 2.5 V
Lead Channel Setting Pacing Pulse Width: 0.4 ms
Lead Channel Setting Sensing Sensitivity: 2 mV
Zone Setting Status: 755011
Zone Setting Status: 755011

## 2022-12-17 DIAGNOSIS — C44319 Basal cell carcinoma of skin of other parts of face: Secondary | ICD-10-CM | POA: Diagnosis not present

## 2022-12-29 DIAGNOSIS — Z85828 Personal history of other malignant neoplasm of skin: Secondary | ICD-10-CM | POA: Diagnosis not present

## 2022-12-29 DIAGNOSIS — D1801 Hemangioma of skin and subcutaneous tissue: Secondary | ICD-10-CM | POA: Diagnosis not present

## 2022-12-29 DIAGNOSIS — Z08 Encounter for follow-up examination after completed treatment for malignant neoplasm: Secondary | ICD-10-CM | POA: Diagnosis not present

## 2022-12-29 DIAGNOSIS — L814 Other melanin hyperpigmentation: Secondary | ICD-10-CM | POA: Diagnosis not present

## 2022-12-29 DIAGNOSIS — L821 Other seborrheic keratosis: Secondary | ICD-10-CM | POA: Diagnosis not present

## 2022-12-29 DIAGNOSIS — C44319 Basal cell carcinoma of skin of other parts of face: Secondary | ICD-10-CM | POA: Diagnosis not present

## 2023-01-05 NOTE — Progress Notes (Signed)
Remote pacemaker transmission.   

## 2023-01-21 DIAGNOSIS — C44319 Basal cell carcinoma of skin of other parts of face: Secondary | ICD-10-CM | POA: Diagnosis not present

## 2023-01-28 ENCOUNTER — Other Ambulatory Visit: Payer: Self-pay | Admitting: Cardiology

## 2023-03-10 ENCOUNTER — Ambulatory Visit (INDEPENDENT_AMBULATORY_CARE_PROVIDER_SITE_OTHER): Payer: Medicare Other

## 2023-03-10 DIAGNOSIS — I480 Paroxysmal atrial fibrillation: Secondary | ICD-10-CM

## 2023-03-11 LAB — CUP PACEART REMOTE DEVICE CHECK
Battery Remaining Longevity: 39 mo
Battery Voltage: 2.96 V
Brady Statistic AP VP Percent: 1.5 %
Brady Statistic AP VS Percent: 98.19 %
Brady Statistic AS VP Percent: 0.11 %
Brady Statistic AS VS Percent: 0.2 %
Brady Statistic RA Percent Paced: 99.51 %
Brady Statistic RV Percent Paced: 1.63 %
Date Time Interrogation Session: 20240725092632
Implantable Lead Connection Status: 753985
Implantable Lead Connection Status: 753985
Implantable Lead Implant Date: 20180123
Implantable Lead Implant Date: 20180123
Implantable Lead Location: 753859
Implantable Lead Location: 753860
Implantable Lead Model: 5076
Implantable Lead Model: 5076
Implantable Pulse Generator Implant Date: 20180123
Lead Channel Impedance Value: 304 Ohm
Lead Channel Impedance Value: 361 Ohm
Lead Channel Impedance Value: 532 Ohm
Lead Channel Impedance Value: 627 Ohm
Lead Channel Pacing Threshold Amplitude: 0.625 V
Lead Channel Pacing Threshold Amplitude: 0.625 V
Lead Channel Pacing Threshold Pulse Width: 0.4 ms
Lead Channel Pacing Threshold Pulse Width: 0.4 ms
Lead Channel Sensing Intrinsic Amplitude: 2.25 mV
Lead Channel Sensing Intrinsic Amplitude: 2.25 mV
Lead Channel Sensing Intrinsic Amplitude: 7.5 mV
Lead Channel Sensing Intrinsic Amplitude: 7.5 mV
Lead Channel Setting Pacing Amplitude: 2 V
Lead Channel Setting Pacing Amplitude: 2.5 V
Lead Channel Setting Pacing Pulse Width: 0.4 ms
Lead Channel Setting Sensing Sensitivity: 2 mV
Zone Setting Status: 755011
Zone Setting Status: 755011

## 2023-03-23 NOTE — Progress Notes (Signed)
Remote pacemaker transmission.   

## 2023-04-08 DIAGNOSIS — J3489 Other specified disorders of nose and nasal sinuses: Secondary | ICD-10-CM | POA: Diagnosis not present

## 2023-04-08 DIAGNOSIS — R040 Epistaxis: Secondary | ICD-10-CM | POA: Diagnosis not present

## 2023-04-17 ENCOUNTER — Other Ambulatory Visit: Payer: Self-pay | Admitting: Cardiology

## 2023-04-17 DIAGNOSIS — I480 Paroxysmal atrial fibrillation: Secondary | ICD-10-CM

## 2023-04-20 NOTE — Telephone Encounter (Signed)
Prescription refill request for Xarelto received.  Indication:afib Last office visit:11/23 Weight:68.5  kg Age:82 Scr:0.96  4/24 CrCl:48.02  ml/min  Prescription refilled

## 2023-04-29 DIAGNOSIS — Z7901 Long term (current) use of anticoagulants: Secondary | ICD-10-CM | POA: Diagnosis not present

## 2023-04-29 DIAGNOSIS — R040 Epistaxis: Secondary | ICD-10-CM | POA: Diagnosis not present

## 2023-05-10 ENCOUNTER — Other Ambulatory Visit: Payer: Self-pay | Admitting: Cardiology

## 2023-05-10 DIAGNOSIS — I480 Paroxysmal atrial fibrillation: Secondary | ICD-10-CM

## 2023-05-10 NOTE — Telephone Encounter (Signed)
*  STAT* If patient is at the pharmacy, call can be transferred to refill team.   1. Which medications need to be refilled? (please list name of each medication and dose if known) XARELTO 15 MG TABS tablet    2. Would you like to learn more about the convenience, safety, & potential cost savings by using the [facility]? N/A   3. Are you open to using the [facility] (Type [facility]. N/A   4. Which pharmacy/location (including street and city if local pharmacy) is medication to be sent to? [facility] [address] [place], [place] - [address]    5. Do they need a 30 day or 90 day supply? 90 Day

## 2023-05-11 MED ORDER — RIVAROXABAN 15 MG PO TABS: 15.0000 mg | ORAL_TABLET | Freq: Every day | ORAL | 1 refills | Status: AC

## 2023-05-11 NOTE — Telephone Encounter (Signed)
Prescription refill request for Xarelto received.  Indication:afib Last office visit:11/23 Weight:68.5  kg Age:82 Scr:0.96  4/24 CrCl:48.02  ml/min  Prescription refilled

## 2023-06-09 ENCOUNTER — Ambulatory Visit: Payer: Medicare Other

## 2023-06-09 DIAGNOSIS — I480 Paroxysmal atrial fibrillation: Secondary | ICD-10-CM

## 2023-06-10 LAB — CUP PACEART REMOTE DEVICE CHECK
Battery Remaining Longevity: 35 mo
Battery Voltage: 2.96 V
Brady Statistic AP VP Percent: 0.87 %
Brady Statistic AP VS Percent: 98.89 %
Brady Statistic AS VP Percent: 0 %
Brady Statistic AS VS Percent: 0.24 %
Brady Statistic RA Percent Paced: 99.65 %
Brady Statistic RV Percent Paced: 0.88 %
Date Time Interrogation Session: 20241023143724
Implantable Lead Connection Status: 753985
Implantable Lead Connection Status: 753985
Implantable Lead Implant Date: 20180123
Implantable Lead Implant Date: 20180123
Implantable Lead Location: 753859
Implantable Lead Location: 753860
Implantable Lead Model: 5076
Implantable Lead Model: 5076
Implantable Pulse Generator Implant Date: 20180123
Lead Channel Impedance Value: 304 Ohm
Lead Channel Impedance Value: 342 Ohm
Lead Channel Impedance Value: 532 Ohm
Lead Channel Impedance Value: 646 Ohm
Lead Channel Pacing Threshold Amplitude: 0.5 V
Lead Channel Pacing Threshold Amplitude: 0.625 V
Lead Channel Pacing Threshold Pulse Width: 0.4 ms
Lead Channel Pacing Threshold Pulse Width: 0.4 ms
Lead Channel Sensing Intrinsic Amplitude: 1.875 mV
Lead Channel Sensing Intrinsic Amplitude: 1.875 mV
Lead Channel Sensing Intrinsic Amplitude: 7.875 mV
Lead Channel Sensing Intrinsic Amplitude: 7.875 mV
Lead Channel Setting Pacing Amplitude: 2 V
Lead Channel Setting Pacing Amplitude: 2.5 V
Lead Channel Setting Pacing Pulse Width: 0.4 ms
Lead Channel Setting Sensing Sensitivity: 2 mV
Zone Setting Status: 755011
Zone Setting Status: 755011

## 2023-06-17 ENCOUNTER — Other Ambulatory Visit: Payer: Self-pay | Admitting: Cardiology

## 2023-06-17 ENCOUNTER — Other Ambulatory Visit: Payer: Self-pay | Admitting: Physician Assistant

## 2023-06-17 NOTE — Progress Notes (Unsigned)
Cardiology Office Note Date:  06/17/2023  Patient ID:  Monica [name], Monica [name] 11/17/1940, MRN [id number] PCP:  Merri Brunette, MD  Cardiologist: Dr. Mayford Knife Electrophysiologist: Dr. Johney Frame    Chief Complaint:   *** annual visit  History of Present Illness: Monica [name] is a 82 y.o. female with history of symptomatic bradycardia w/PPM, AFib, HTN, HLD  She saw Dr. Johney Frame, July 2022, AF burden was done to 7.9% (from 28%) planned to continue amiodarone and wean off metoprolol with plans to see again in 3 mo and continue to wean off BB.  Labs updated  I saw her 06/17/21 She is feeling better on reduced lopressor dose. Reports home numbers 110's-120's/60's-70's, and has not felt any AFib since starting amiodarone She does not have all the energy she would like, but feels well otherwise. No CP, no SOB, no near syncope or syncope She struggles with constipation and hemorrhoids, has some bleeding with BM but none otherwise. She was feeling well, did not want to wean BB, no changes were made  I saw her Feb 2023 She is ding quite well Able to do her ADL's,  cares for herself and her home independently though wishes she had better stamina. She gives an example that when raking her leaves she has to take breaks getting winded, SOB, can work longer with the blower, but enjoys raking as well. Activities that require both upper/lower body exertion tire her quicker then routine activities. No CP, no rest SOB No near syncope or syncope. She does not think she has had any AFib and is very happy about that. Her current level of exertional capacity is unchanged for over a year, but was hoping with managing the Afib, she would have better stamina, though very thankful without the AFIb feeling much improved overall. No new bleeding or signs of bleeding, some blood with BM, unchanged Encouraged her to stay active though to pace herself with the heavier activities. Low AF burden, planned for amio  labs  I saw her Nov 2023 She is doing well.  Has L knee pain that limits her but denies any CP/SOB or difficulties otherwise with her ADLs No near syncope or syncope. 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  AFib Hx PVI ablation Oct 2013, Dec 2015, 05/21/2020  AAD Hx Sotalol started 2018 >> stopped April 2022 with increasing AF burden Amiodarone started April 2022 >> current   Past Medical History:  Diagnosis Date   Adrenal adenoma 6/11   lt on ct 6/11   Atrial fibrillation (HCC) 01/21/2011   Atrial flutter (HCC) 05/16/2012   Basal cell carcinoma 11/25/2020   nod- Right ala nasi (MOHS)   BCC (basal cell carcinoma of skin) 05/22/2013   Right Upper Lip (curet, cautery, and excision)   BCC (basal cell carcinoma of skin) 08/24/2013   Right Upper Lip(Sclerosis + margin) (MOH's)   Bradycardia 01/21/2011   Coronary artery calcification seen on CT scan 11/01/2018   GERD (gastroesophageal reflux disease)    Hypercholesteremia    Hypertension    Monica [name] stone 6/11   rt. on ct 6/11   Nodular basal cell carcinoma (BCC) 06/13/2019   Left Temple (curet, cuatery  and excision)   Osteoporosis    Palpitations 05/30/2014   Paroxysmal atrial fibrillation (HCC)    a. PVI 10/13 b. PVI 12/15   Superficial nodular basal cell carcinoma (BCC) 06/13/2019   Above Left Brow (curet and 5FU)   Superficial nodular basal cell carcinoma (BCC) 06/13/2019   Right Cheekbone (curet and 5FU)    Past Surgical History:  Procedure Laterality Date   ATRIAL FIBRILLATION ABLATION N/A 06/07/2012   PVI and CTI ablation by Dr Johney Frame   ATRIAL FIBRILLATION ABLATION N/A 07/31/2014   PVI by Dr Johney Frame; initial ablation 2013    ATRIAL FIBRILLATION ABLATION N/A 05/21/2020   Procedure: ATRIAL FIBRILLATION ABLATION;  Surgeon: Hillis Range, MD;  Location: [facility] INVASIVE CV LAB;  Service: Cardiovascular;  Laterality: N/A;   CARDIOVERSION  05/06/2012   Procedure: CARDIOVERSION;  Surgeon: Quintella Reichert, MD;  Location: [facility] ENDOSCOPY;  Service: Cardiovascular;  Laterality: N/A;  h/p in file drawer   CARDIOVERSION N/A 10/06/2017   Procedure: CARDIOVERSION;  Surgeon: Chilton Si, MD;  Location: [facility] ENDOSCOPY;  Service: Cardiovascular;  Laterality: N/A;   CARDIOVERSION N/A 03/28/2018   Procedure: CARDIOVERSION;  Surgeon: Lars Masson, MD;  Location: [facility] ENDOSCOPY;  Service: Cardiovascular;  Laterality: N/A;   CARDIOVERSION N/A 02/09/2019   Procedure: CARDIOVERSION;  Surgeon: Chrystie Nose, MD;  Location: [facility] ENDOSCOPY;  Service: Cardiovascular;  Laterality: N/A;   CESAREAN SECTION  1978-1979   x-2   CHOLECYSTECTOMY  1989   ELECTROPHYSIOLOGIC STUDY N/A 09/08/2016   Procedure: Cardioversion;  Surgeon: Hillis Range, MD;  Location: [facility] INVASIVE CV LAB;  Service: Cardiovascular;  Laterality: N/A;   EP IMPLANTABLE DEVICE N/A 09/08/2016   Procedure: Loop Recorder Removal;  Surgeon: Hillis Range, MD;  Location: [facility] INVASIVE CV LAB;  Service: Cardiovascular;  Laterality: N/A;   EP IMPLANTABLE DEVICE N/A 09/08/2016   Procedure: Pacemaker Implant;  Surgeon: Hillis Range, MD;  Location: [facility] INVASIVE CV LAB;  Service: Cardiovascular;  Laterality: N/A;   Implantable loop recorder implantation  05/30/14   Medtronic Reveal LINQ implanted by Dr Johney Frame with RIO II protocol (ambulatory setting)   TEE WITHOUT CARDIOVERSION  06/06/2012   Procedure: TRANSESOPHAGEAL ECHOCARDIOGRAM (TEE);  Surgeon: Corky Crafts, MD;  Location: [facility] ENDOSCOPY;  Service: Cardiovascular;  Laterality: N/A;   TEE WITHOUT CARDIOVERSION N/A 07/30/2014   Procedure: TRANSESOPHAGEAL ECHOCARDIOGRAM (TEE);  Surgeon: Laurey Morale, MD;  Location: [facility] ENDOSCOPY;  Service:  Cardiovascular;  Laterality: N/A;   TEE WITHOUT CARDIOVERSION N/A 10/06/2017   Procedure: TRANSESOPHAGEAL ECHOCARDIOGRAM (TEE);  Surgeon: Chilton Si, MD;  Location: [facility] ENDOSCOPY;  Service: Cardiovascular;  Laterality: N/A;   TEE WITHOUT CARDIOVERSION N/A 03/28/2018   Procedure: TRANSESOPHAGEAL ECHOCARDIOGRAM (TEE);  Surgeon: Lars Masson, MD;  Location: [facility] ENDOSCOPY;  Service: Cardiovascular;  Laterality: N/A;   TEE WITHOUT CARDIOVERSION N/A 09/30/2018   Procedure: TRANSESOPHAGEAL ECHOCARDIOGRAM (TEE);  Surgeon: Parke Poisson, MD;  Location: [facility] ENDOSCOPY;  Service: Cardiovascular;  Laterality: N/A;   TEE WITHOUT CARDIOVERSION N/A 02/09/2019   Procedure: TRANSESOPHAGEAL ECHOCARDIOGRAM (TEE);  Surgeon: Chrystie Nose, MD;  Location: [facility] ENDOSCOPY;  Service: Cardiovascular;  Laterality: N/A;    Current Outpatient Medications  Medication Sig Dispense Refill   acetaminophen (TYLENOL) 500 MG tablet Take 250-500 mg by mouth every 6 (six) hours as needed for moderate pain.     alum & mag hydroxide-simeth (MAALOX/MYLANTA) 200-200-20 MG/5ML suspension Take 15 mLs by mouth every 6 (six) hours as needed for indigestion or heartburn.     amiodarone (PACERONE) 200 MG tablet  TAKE ONE TABLET BY MOUTH every DAY 90 tablet 2   amLODipine (NORVASC) 2.5 MG tablet TAKE ONE TABLET BY MOUTH DAILY 180 tablet 3   calcium carbonate (OS-CAL) 600 MG TABS tablet Take 600 mg by mouth 2 (two) times daily with a meal.      calcium carbonate (TUMS - DOSED IN MG ELEMENTAL CALCIUM) 500 MG chewable tablet Chew 1-2 tablets by mouth 3 (three) times daily as needed for indigestion or heartburn.     Cholecalciferol (VITAMIN D) 50 MCG (2000 UT) tablet Take 2,000 Units by mouth every evening.      Ferrous Gluconate (IRON 27 PO) Take 1 tablet by mouth daily.     metoprolol tartrate (LOPRESSOR) 25 MG tablet TAKE ONE TABLET BY MOUTH twice A DAY 180 tablet 2   pantoprazole (PROTONIX) 40 MG tablet Take 40 mg by mouth daily  before breakfast.      Polyethyl Glycol-Propyl Glycol (SYSTANE ULTRA PF OP) Place 1 drop into both eyes daily as needed (dry eye).      polyethylene glycol (MIRALAX / GLYCOLAX) 17 g packet Take 17 g by mouth daily as needed for mild constipation or moderate constipation.     Rivaroxaban (XARELTO) 15 MG TABS tablet Take 1 tablet (15 mg total) by mouth daily with supper. 90 tablet 1   rosuvastatin (CRESTOR) 20 MG tablet TAKE ONE TABLET BY MOUTH ONCE DAILY 90 tablet 1   traZODone (DESYREL) 50 MG tablet Take 50-75 mg by mouth at bedtime.     Wheat Dextrin (BENEFIBER DRINK MIX PO) Take 1 Applicatorful by mouth daily.     No current facility-administered medications for this visit.    Allergies:   Morphine and codeine and Codeine   Social History:  The patient  reports that she has never smoked. She has never used smokeless tobacco. She reports that she does not drink alcohol and does not use drugs.   Family History:  The patient's family history includes Atrial fibrillation in her sister; CAD in her father; Heart attack in her brother and father; Hip fracture in her mother; Lung cancer in her brother; Other in her brother; Ovarian cancer in her sister; Thyroid cancer in her sister.  ROS:  Please see the history of present illness.    All other systems are reviewed and otherwise negative.   PHYSICAL EXAM:  VS:  There were no vitals taken for this visit. BMI: There is no height or weight on file to calculate BMI. Well nourished, well developed, in no acute distress HEENT: normocephalic, atraumatic Neck: no JVD, carotid bruits or masses Cardiac: *** RRR; no significant murmurs, no rubs, or gallops Lungs: *** CTA b/l, no wheezing, rhonchi or rales Abd: soft, nontender MS: no deformity or atrophy Ext: *** no edema Skin: warm and dry, no rash Neuro:  No gross deficits appreciated Psych: euthymic mood, full affect  *** PPM site is stable, no tethering or discomfort   EKG:  done today and  reviewed by myself ***  Device interrogation done today and reviewed by myself:  *** Battery and led measurements are good ***    04/24/2020: TTE IMPRESSIONS   1. Left ventricular ejection fraction, by estimation, is 60 to 65%. The  left ventricle has normal function. The left ventricle has no regional  wall motion abnormalities. Left ventricular diastolic parameters are  consistent with Grade II diastolic  dysfunction (pseudonormalization).   2. Right ventricular systolic function is normal. The right ventricular  size is mildly enlarged. There  is normal pulmonary artery systolic  pressure. The estimated right ventricular systolic pressure is 33.5 mmHg.   3. Left atrial size was moderately dilated.   4. Right atrial size was mildly dilated.   5. The mitral valve is normal in structure. No evidence of mitral valve  regurgitation. No evidence of mitral stenosis.   6. The aortic valve is tricuspid. Aortic valve regurgitation is trivial.  Mild to moderate aortic valve sclerosis/calcification is present, without  any evidence of aortic stenosis. Aortic valve mean gradient measures 5.0  mmHg. Aortic valve Vmax measures  1.54 m/s.   7. The inferior vena cava is normal in size with greater than 50%  respiratory variability, suggesting right atrial pressure of 3 mmHg.   Comparison(s): No significant change from prior study. Prior images  reviewed side by side.    05/21/2020: EPS/Ablation CONCLUSIONS: 1.  Sinus rhythm upon presentation.   2. Intracardiac echo reveals a moderate sized left atrium with four separate pulmonary veins without evidence of pulmonary vein stenosis. 3. Minimal return of conduction along the carina between the left superior and inferior pulmonary veins.  Ablation was performed at this location.   4. Return of electrical activity along the right pulmonary veins with successful sequential electrical re isolation and anatomical encircling of right right pulmonary  veins using a WACA approach 5. Additional left atrial ablation was performed with a standard box lesion created along the posterior wall of the left atrium 6. No early apparent complications.   12/14/2018: stress myoview The left ventricular ejection fraction is normal (55-65%). Nuclear stress EF: 61%. No T wave inversion was noted during stress. This is a low risk study.   Normal perfusion. LVEF 61% with normal wall motion. This is a low risk study.      Recent Labs: No results found for requested labs within last 365 days.  No results found for requested labs within last 365 days.   CrCl cannot be calculated (Patient's most recent lab result is older than the maximum 21 days allowed.).   Wt Readings from Last 3 Encounters:  06/17/22 151 lb (68.5 kg)  09/23/21 155 lb 12.8 oz (70.7 kg)  06/17/21 155 lb 12.8 oz (70.7 kg)     Other studies reviewed: Additional studies/records reviewed today include: summarized above  ASSESSMENT AND PLAN:  PPM *** Intact function  *** No programming changes  Paroxysmal Afib CHA2DS2Vasc is *** 4, on Xarelto, *** appropriately dosed at 15mg  daily  *** % burden Chronically on amiodarone ***  HTN *** Great home readings This is known for her to have high readings in the office  Discussed need for new EP MD Will transition to Dr. Elberta Fortis   Disposition: ***  Current medicines are reviewed at length with the patient today.  The patient did not have any concerns regarding medicines.  Norma Fredrickson, PA-C 06/17/2023 10:08 AM     [facility] [address] [place] [address] [phone] (office)  [phone] (fax)

## 2023-06-21 ENCOUNTER — Ambulatory Visit: Payer: Medicare Other | Attending: Physician Assistant | Admitting: Physician Assistant

## 2023-06-21 ENCOUNTER — Encounter: Payer: Self-pay | Admitting: Physician Assistant

## 2023-06-21 VITALS — BP 120/84 | HR 72 | Ht 63.0 in | Wt 156.2 lb

## 2023-06-21 DIAGNOSIS — R0600 Dyspnea, unspecified: Secondary | ICD-10-CM | POA: Diagnosis not present

## 2023-06-21 DIAGNOSIS — I480 Paroxysmal atrial fibrillation: Secondary | ICD-10-CM

## 2023-06-21 DIAGNOSIS — Z950 Presence of cardiac pacemaker: Secondary | ICD-10-CM

## 2023-06-21 DIAGNOSIS — I4892 Unspecified atrial flutter: Secondary | ICD-10-CM

## 2023-06-21 DIAGNOSIS — I10 Essential (primary) hypertension: Secondary | ICD-10-CM | POA: Diagnosis not present

## 2023-06-21 LAB — CUP PACEART INCLINIC DEVICE CHECK
Date Time Interrogation Session: 20241104121506
Implantable Lead Connection Status: 753985
Implantable Lead Connection Status: 753985
Implantable Lead Implant Date: 20180123
Implantable Lead Implant Date: 20180123
Implantable Lead Location: 753860
Implantable Lead Location: 753859
Implantable Lead Model: 5076
Implantable Lead Model: 5076
Implantable Pulse Generator Implant Date: 20180123
Lead Channel Pacing Threshold Amplitude: 0.75 V
Lead Channel Pacing Threshold Amplitude: 0.75 V
Lead Channel Pacing Threshold Pulse Width: 0.4 ms
Lead Channel Pacing Threshold Pulse Width: 0.4 ms
Lead Channel Sensing Intrinsic Amplitude: 11.3 mV

## 2023-06-21 NOTE — Patient Instructions (Addendum)
Medication Instructions:  STOP AMIODARONE *If you need a refill on your cardiac medications before your next appointment, please call your pharmacy*   Lab Work: TODY CMET CBC AND TSH If you have labs (blood work) drawn today and your tests are completely normal, you will receive your results only by: MyChart Message (if you have MyChart) OR A paper copy in the mail If you have any lab test that is abnormal or we need to change your treatment, we will call you to review the results.   Testing/Procedures: Your physician has requested that you have an echocardiogram. Echocardiography is a painless test that uses sound waves to create images of your heart. It provides your doctor with information about the size and shape of your heart and how well your heart's chambers and valves are working. This procedure takes approximately one hour. There are no restrictions for this procedure. Please do NOT wear cologne, perfume, aftershave, or lotions (deodorant is allowed). Please arrive 15 minutes prior to your appointment time.  Please note: We ask at that you not bring children with you during ultrasound (echo/ vascular) testing. Due to room size and safety concerns, children are not allowed in the ultrasound rooms during exams. Our front office staff cannot provide observation of children in our lobby area while testing is being conducted. An adult accompanying a patient to their appointment will only be allowed in the ultrasound room at the discretion of the ultrasound technician under special circumstances. We apologize for any inconvenience. HIGH RESOLUTION CT   Follow-Up: At [facility], you and your health needs are our priority.  As part of our continuing mission to provide you with exceptional heart care, we have created designated Provider Care Teams.  These Care Teams include your primary Cardiologist (physician) and Advanced Practice Providers (APPs -  Physician Assistants and Nurse  Practitioners) who all work together to provide you with the care you need, when you need it.  We recommend signing up for the patient portal called "MyChart".  Sign up information is provided on this After Visit Summary.  MyChart is used to connect with patients for Virtual Visits (Telemedicine).  Patients are able to view lab/test results, encounter notes, upcoming appointments, etc.  Non-urgent messages can be sent to your provider as well.   To learn more about what you can do with MyChart, go to [email or web address].    Your next appointment:   3 month(s)  Provider:  3 MONTHS WITH DR CAMNITZ ONLY PER RENEE NEW FOR DR CAMNITZ    Other Instructions NONE

## 2023-06-22 LAB — COMPREHENSIVE METABOLIC PANEL
ALT: 34 [IU]/L — ABNORMAL HIGH (ref 0–32)
AST: 35 [IU]/L (ref 0–40)
Albumin: 4.5 g/dL (ref 3.7–4.7)
Alkaline Phosphatase: 119 [IU]/L (ref 44–121)
BUN/Creatinine Ratio: 16 (ref 12–28)
BUN: 19 mg/dL (ref 8–27)
Bilirubin Total: 0.6 mg/dL (ref 0.0–1.2)
CO2: 24 mmol/L (ref 20–29)
Calcium: 10.2 mg/dL (ref 8.7–10.3)
Chloride: 102 mmol/L (ref 96–106)
Creatinine, Ser: 1.16 mg/dL — ABNORMAL HIGH (ref 0.57–1.00)
Globulin, Total: 2.7 g/dL (ref 1.5–4.5)
Glucose: 102 mg/dL — ABNORMAL HIGH (ref 70–99)
Potassium: 5 mmol/L (ref 3.5–5.2)
Sodium: 139 mmol/L (ref 134–144)
Total Protein: 7.2 g/dL (ref 6.0–8.5)
eGFR: 47 mL/min/{1.73_m2} — ABNORMAL LOW (ref >59)

## 2023-06-22 LAB — CBC
Hematocrit: 47.6 % — ABNORMAL HIGH (ref 34.0–46.6)
Hemoglobin: 14.8 g/dL (ref 11.1–15.9)
MCH: 30.1 pg (ref 26.6–33.0)
MCHC: 31.1 g/dL — ABNORMAL LOW (ref 31.5–35.7)
MCV: 97 fL (ref 79–97)
Platelets: 227 10*3/uL (ref 150–450)
RBC: 4.91 x10E6/uL (ref 3.77–5.28)
RDW: 13.3 % (ref 11.7–15.4)
WBC: 6.5 10*3/uL (ref 3.4–10.8)

## 2023-06-22 LAB — TSH: TSH: 0.963 u[IU]/mL (ref 0.450–4.500)

## 2023-06-29 NOTE — Progress Notes (Signed)
Remote pacemaker transmission.   

## 2023-07-01 DIAGNOSIS — L821 Other seborrheic keratosis: Secondary | ICD-10-CM | POA: Diagnosis not present

## 2023-07-01 DIAGNOSIS — Z08 Encounter for follow-up examination after completed treatment for malignant neoplasm: Secondary | ICD-10-CM | POA: Diagnosis not present

## 2023-07-01 DIAGNOSIS — L814 Other melanin hyperpigmentation: Secondary | ICD-10-CM | POA: Diagnosis not present

## 2023-07-01 DIAGNOSIS — Z789 Other specified health status: Secondary | ICD-10-CM | POA: Diagnosis not present

## 2023-07-01 DIAGNOSIS — Z85828 Personal history of other malignant neoplasm of skin: Secondary | ICD-10-CM | POA: Diagnosis not present

## 2023-07-01 DIAGNOSIS — L538 Other specified erythematous conditions: Secondary | ICD-10-CM | POA: Diagnosis not present

## 2023-07-01 DIAGNOSIS — L820 Inflamed seborrheic keratosis: Secondary | ICD-10-CM | POA: Diagnosis not present

## 2023-07-01 DIAGNOSIS — L2989 Other pruritus: Secondary | ICD-10-CM | POA: Diagnosis not present

## 2023-07-01 DIAGNOSIS — D225 Melanocytic nevi of trunk: Secondary | ICD-10-CM | POA: Diagnosis not present

## 2023-07-08 DIAGNOSIS — Z23 Encounter for immunization: Secondary | ICD-10-CM | POA: Diagnosis not present

## 2023-07-08 DIAGNOSIS — Z0000 Encounter for general adult medical examination without abnormal findings: Secondary | ICD-10-CM | POA: Diagnosis not present

## 2023-07-08 DIAGNOSIS — E782 Mixed hyperlipidemia: Secondary | ICD-10-CM | POA: Diagnosis not present

## 2023-07-08 DIAGNOSIS — I10 Essential (primary) hypertension: Secondary | ICD-10-CM | POA: Diagnosis not present

## 2023-07-08 DIAGNOSIS — G4700 Insomnia, unspecified: Secondary | ICD-10-CM | POA: Diagnosis not present

## 2023-07-08 DIAGNOSIS — K219 Gastro-esophageal reflux disease without esophagitis: Secondary | ICD-10-CM | POA: Diagnosis not present

## 2023-07-08 DIAGNOSIS — I480 Paroxysmal atrial fibrillation: Secondary | ICD-10-CM | POA: Diagnosis not present

## 2023-07-21 ENCOUNTER — Ambulatory Visit (HOSPITAL_BASED_OUTPATIENT_CLINIC_OR_DEPARTMENT_OTHER)
Admission: RE | Admit: 2023-07-21 | Discharge: 2023-07-21 | Disposition: A | Discharge status: Home / Self Care | Payer: Medicare Other | Source: Ambulatory Visit | Attending: Physician Assistant | Admitting: Physician Assistant

## 2023-07-21 DIAGNOSIS — R0600 Dyspnea, unspecified: Secondary | ICD-10-CM | POA: Diagnosis not present

## 2023-07-21 DIAGNOSIS — R0602 Shortness of breath: Secondary | ICD-10-CM | POA: Diagnosis not present

## 2023-07-21 DIAGNOSIS — R918 Other nonspecific abnormal finding of lung field: Secondary | ICD-10-CM | POA: Diagnosis not present

## 2023-07-29 ENCOUNTER — Ambulatory Visit (HOSPITAL_COMMUNITY): Payer: Medicare Other | Attending: Physician Assistant

## 2023-07-29 DIAGNOSIS — R0600 Dyspnea, unspecified: Secondary | ICD-10-CM | POA: Diagnosis not present

## 2023-07-29 LAB — ECHOCARDIOGRAM COMPLETE
AR max vel: 1.88 cm2
AV Area VTI: 1.94 cm2
AV Area mean vel: 1.8 cm2
AV Mean grad: 9.5 mm[Hg]
AV Peak grad: 16.5 mm[Hg]
Ao pk vel: 2.03 m/s
Area-P 1/2: 4.1 cm2
MV M vel: 5.76 m/s
MV Peak grad: 132.5 mm[Hg]
S' Lateral: 2.4 cm

## 2023-08-03 ENCOUNTER — Telehealth: Payer: Self-pay

## 2023-08-03 DIAGNOSIS — Z79899 Other long term (current) drug therapy: Secondary | ICD-10-CM

## 2023-08-03 MED ORDER — FUROSEMIDE 20 MG PO TABS: 20.0000 mg | ORAL_TABLET | Freq: Every day | ORAL | 0 refills | Status: DC

## 2023-08-03 MED ORDER — FUROSEMIDE 20 MG PO TABS: 20.0000 mg | ORAL_TABLET | Freq: Every day | ORAL | 3 refills | Status: DC

## 2023-08-03 NOTE — Telephone Encounter (Signed)
-----   Message from Sheilah Pigeon sent at 08/03/2023  7:31 AM EST ----- CT is negative for findings of amiodarone toxicity. With some diastolic dysfunction on her echo she may benefit from low dose diuretic to help with her DOE Start lasix 20mg  daily and get BMET in 7-10 days

## 2023-08-03 NOTE — Telephone Encounter (Signed)
Called and spoke with patient in detail. She verbalized understanding and agrees to plan for Lasix. Sent 30 days to local [facility] and ongoing script to mail order per request. BMET entered and released and she understands when to come have drawn.

## 2023-08-10 ENCOUNTER — Telehealth: Payer: Self-pay | Admitting: Cardiology

## 2023-08-10 NOTE — Telephone Encounter (Signed)
Pt c/o medication issue:  1. Name of Medication:   Rivaroxaban (XARELTO) 15 MG TABS tablet    2. How are you currently taking this medication (dosage and times per day)?  Take 1 tablet (15 mg total) by mouth daily with supper.       3. Are you having a reaction (difficulty breathing--STAT)? No  4. What is your medication issue? Mandi from the [facility] called stating pt's PCP has increased this medication to 20mg  and now she's concerned and would like to know if it's okay for her to take the 20mg  or should she continue to take the 15mg  orginally prescribed to her. They'd like a callback and the case number is [id number]. Please advise

## 2023-08-10 NOTE — Telephone Encounter (Signed)
Addressed on a previous encounter.

## 2023-08-10 NOTE — Telephone Encounter (Signed)
Pt c/o medication issue:  1. Name of Medication:   Rivaroxaban (XARELTO) 15 MG TABS tablet   2. How are you currently taking this medication (dosage and times per day)?   As prescribed  3. Are you having a reaction (difficulty breathing--STAT)?   4. What is your medication issue?   Patient stated she recently had this prescription refilled by her PCP but the medication was filled at a 20 mg dosage.  Patient wants to know if she can take the high dosage of this medication.  Patient noted she will be running out of her current prescription on Saturday.

## 2023-08-10 NOTE — Telephone Encounter (Signed)
Spoke with pt over the phone and explained that I will be forwarding this message to Dr. Norris Cross nurse to address on Thursday when she is back in the office. Pt verbalized understanding and had no further questions at this time.

## 2023-08-12 MED ORDER — RIVAROXABAN 15 MG PO TABS: 15.0000 mg | ORAL_TABLET | Freq: Two times a day (BID) | ORAL | Status: DC

## 2023-08-12 NOTE — Telephone Encounter (Signed)
Call to patient to advise that samples of xarelto will be at the front desk for her.  No answer, left detailed message advising patient she can pick up samples this afternoon.

## 2023-08-12 NOTE — Telephone Encounter (Signed)
Call to patient who explains that wrong dose of xarelto was called into her pharmacy. On review of epic, I can find no record that we called in her xarleto recently. Last refill was September at dose of 15 mg. Patient states 20 mg xarelto dose was called in by PCP. Patient verbalizes understanding that xarelto cannot be split.  Advised her to call PCP office and have them call in correct dose. Patient states that she did and they told her to call us. Explained to patient that I can refill at the correct dose but I cannot cancel Dr. Michaelle Copas order or ask pharmacy to refund her. Patient states she is willing to pay for a new script and work out canceling previous order with Dr. Michaelle Copas office, but she does not have enough  xarelto to last while she waits for new shipment of xarelto from mail away pharmacy. Patient requests samples.

## 2023-08-12 NOTE — Addendum Note (Signed)
Addended by: Rexene Edison L on: 08/12/2023 01:00 PM   Modules accepted: Orders

## 2023-08-13 DIAGNOSIS — Z79899 Other long term (current) drug therapy: Secondary | ICD-10-CM | POA: Diagnosis not present

## 2023-08-13 LAB — BASIC METABOLIC PANEL
BUN/Creatinine Ratio: 16 (ref 12–28)
BUN: 16 mg/dL (ref 8–27)
CO2: 24 mmol/L (ref 20–29)
Calcium: 10.4 mg/dL — ABNORMAL HIGH (ref 8.7–10.3)
Chloride: 102 mmol/L (ref 96–106)
Creatinine, Ser: 1.01 mg/dL — ABNORMAL HIGH (ref 0.57–1.00)
Glucose: 92 mg/dL (ref 70–99)
Potassium: 4.8 mmol/L (ref 3.5–5.2)
Sodium: 141 mmol/L (ref 134–144)
eGFR: 55 mL/min/{1.73_m2} — ABNORMAL LOW (ref >59)

## 2023-09-08 ENCOUNTER — Ambulatory Visit (INDEPENDENT_AMBULATORY_CARE_PROVIDER_SITE_OTHER): Payer: Medicare Other

## 2023-09-08 ENCOUNTER — Other Ambulatory Visit: Payer: Self-pay | Admitting: Physician Assistant

## 2023-09-08 DIAGNOSIS — I4892 Unspecified atrial flutter: Secondary | ICD-10-CM | POA: Diagnosis not present

## 2023-09-08 LAB — CUP PACEART REMOTE DEVICE CHECK
Battery Remaining Longevity: 32 mo
Battery Voltage: 2.95 V
Brady Statistic AP VP Percent: 0.61 %
Brady Statistic AP VS Percent: 97.43 %
Brady Statistic AS VP Percent: 0.37 %
Brady Statistic AS VS Percent: 1.59 %
Brady Statistic RA Percent Paced: 97.88 %
Brady Statistic RV Percent Paced: 0.99 %
Date Time Interrogation Session: 20250122133859
Implantable Lead Connection Status: 753985
Implantable Lead Connection Status: 753985
Implantable Lead Implant Date: 20180123
Implantable Lead Implant Date: 20180123
Implantable Lead Location: 753859
Implantable Lead Location: 753860
Implantable Lead Model: 5076
Implantable Lead Model: 5076
Implantable Pulse Generator Implant Date: 20180123
Lead Channel Impedance Value: 304 Ohm
Lead Channel Impedance Value: 342 Ohm
Lead Channel Impedance Value: 437 Ohm
Lead Channel Impedance Value: 532 Ohm
Lead Channel Pacing Threshold Amplitude: 0.5 V
Lead Channel Pacing Threshold Amplitude: 0.875 V
Lead Channel Pacing Threshold Pulse Width: 0.4 ms
Lead Channel Pacing Threshold Pulse Width: 0.4 ms
Lead Channel Sensing Intrinsic Amplitude: 1.375 mV
Lead Channel Sensing Intrinsic Amplitude: 1.375 mV
Lead Channel Sensing Intrinsic Amplitude: 7.75 mV
Lead Channel Sensing Intrinsic Amplitude: 7.75 mV
Lead Channel Setting Pacing Amplitude: 2 V
Lead Channel Setting Pacing Amplitude: 2.5 V
Lead Channel Setting Pacing Pulse Width: 0.4 ms
Lead Channel Setting Sensing Sensitivity: 2 mV
Zone Setting Status: 755011
Zone Setting Status: 755011

## 2023-10-03 NOTE — Progress Notes (Unsigned)
  Electrophysiology Office Note:   Date:  10/04/2023  ID:  Monica [name], DOB [birth year], MRN [id number]  Primary Cardiologist: Monica Magic, MD Primary Heart Failure: None Electrophysiologist: Monica Range, MD (Inactive)      History of Present Illness:   Monica [name] is a 83 y.o. female with h/o symptomatic bradycardia post pacemaker, atrial fibrillation, hypertension, hyperlipidemia seen today for routine electrophysiology followup.   Since last being seen in our clinic the patient reports doing overall well.  She does complain of some fatigue, but does not have much shortness of breath.  Her fatigue has been extensively worked up.  She has been taken off of amiodarone.  She feels well when she is at rest, but does notice fatigue when she tries to exert herself..  she denies chest pain, palpitations, dyspnea, PND, orthopnea, nausea, vomiting, dizziness, syncope, edema, weight gain, or early satiety.   Review of systems complete and found to be negative unless listed in HPI.      EP Information / Studies Reviewed:    EKG is not ordered today. EKG from 06/21/23 reviewed which showed atrial paced, 1d AVB      PPM Interrogation-  reviewed in detail today,  See PACEART report.  Device History: Abbott Dual Chamber PPM implanted 09/08/2016 for Symptomatic bradycardia  Risk Assessment/Calculations:    CHA2DS2-VASc Score = 5   This indicates a 7.2% annual risk of stroke. The patient's score is based upon: CHF History: 0 HTN History: 1 Diabetes History: 0 Stroke History: 0 Vascular Disease History: 1 Age Score: 2 Gender Score: 1            Physical Exam:   VS:  BP (!) 130/90 (BP Location: Left Arm, Patient Position: Sitting, Cuff Size: Normal)   Pulse 64   Ht 5\' 3"  (1.6 m)   Wt 155 lb 12.8 oz (70.7 kg)   SpO2 98%   BMI 27.60 kg/m    Wt Readings from Last 3 Encounters:  10/04/23 155 lb 12.8 oz (70.7 kg)  06/21/23 156 lb 3.2 oz (70.9 kg)  06/17/22 151 lb (68.5 kg)      GEN: Well nourished, well developed in no acute distress NECK: No JVD; No carotid bruits CARDIAC: Regular rate and rhythm, no murmurs, rubs, gallops RESPIRATORY:  Clear to auscultation without rales, wheezing or rhonchi  ABDOMEN: Soft, non-tender, non-distended EXTREMITIES:  No edema; No deformity   ASSESSMENT AND PLAN:    Symptomatic bradycardia s/p Abbott PPM  Normal PPM function See Pace Art report No changes today  2.  Paroxysmal atrial fibrillation: Post ablation x 3.  She continues to have atrial fibrillation today 0.3% burden.  She is continue to have fatigue, but this cannot be correlated to her atrial fibrillation.  Monica [name] stop metoprolol and start Toprol-XL 50 mg daily.  3.  Secondary hypercoagulable state: Currently on Xarelto 15 mg daily for atrial fibrillation  4.  Hypertension: Mildly elevated but usually well-controlled.  Plan per primary physician  Disposition:   Follow up with EP APP in 12 months  Signed, Monica Turay Jorja Loa, MD

## 2023-10-04 ENCOUNTER — Encounter: Payer: Self-pay | Admitting: Cardiology

## 2023-10-04 ENCOUNTER — Ambulatory Visit: Payer: Medicare Other | Attending: Cardiology | Admitting: Cardiology

## 2023-10-04 VITALS — BP 130/90 | HR 64 | Ht 63.0 in | Wt 155.8 lb

## 2023-10-04 DIAGNOSIS — I480 Paroxysmal atrial fibrillation: Secondary | ICD-10-CM | POA: Diagnosis not present

## 2023-10-04 DIAGNOSIS — I10 Essential (primary) hypertension: Secondary | ICD-10-CM | POA: Diagnosis not present

## 2023-10-04 DIAGNOSIS — R001 Bradycardia, unspecified: Secondary | ICD-10-CM

## 2023-10-04 DIAGNOSIS — D6869 Other thrombophilia: Secondary | ICD-10-CM | POA: Diagnosis not present

## 2023-10-04 LAB — CUP PACEART INCLINIC DEVICE CHECK
Battery Remaining Longevity: 34 mo
Battery Voltage: 2.95 V
Brady Statistic AP VP Percent: 0.49 %
Brady Statistic AP VS Percent: 97.58 %
Brady Statistic AS VP Percent: 0.44 %
Brady Statistic AS VS Percent: 1.49 %
Brady Statistic RA Percent Paced: 97.91 %
Brady Statistic RV Percent Paced: 0.93 %
Date Time Interrogation Session: 20250217123108
Implantable Lead Connection Status: 753985
Implantable Lead Connection Status: 753985
Implantable Lead Implant Date: 20180123
Implantable Lead Implant Date: 20180123
Implantable Lead Location: 753859
Implantable Lead Location: 753860
Implantable Lead Model: 5076
Implantable Lead Model: 5076
Implantable Pulse Generator Implant Date: 20180123
Lead Channel Impedance Value: 323 Ohm
Lead Channel Impedance Value: 380 Ohm
Lead Channel Impedance Value: 532 Ohm
Lead Channel Impedance Value: 627 Ohm
Lead Channel Pacing Threshold Amplitude: 0.5 V
Lead Channel Pacing Threshold Amplitude: 0.625 V
Lead Channel Pacing Threshold Pulse Width: 0.4 ms
Lead Channel Pacing Threshold Pulse Width: 0.4 ms
Lead Channel Sensing Intrinsic Amplitude: 1.25 mV
Lead Channel Sensing Intrinsic Amplitude: 1.375 mV
Lead Channel Sensing Intrinsic Amplitude: 8.625 mV
Lead Channel Sensing Intrinsic Amplitude: 9.25 mV
Lead Channel Setting Pacing Amplitude: 2 V
Lead Channel Setting Pacing Amplitude: 2.5 V
Lead Channel Setting Pacing Pulse Width: 0.4 ms
Lead Channel Setting Sensing Sensitivity: 2 mV
Zone Setting Status: 755011
Zone Setting Status: 755011

## 2023-10-04 MED ORDER — METOPROLOL SUCCINATE ER 50 MG PO TB24: 50.0000 mg | ORAL_TABLET | Freq: Every day | ORAL | 11 refills | Status: DC

## 2023-10-04 NOTE — Patient Instructions (Signed)
Medication Instructions:  Your physician has recommended you make the following change in your medication: STOP Metoprolol Tartrate START Metoprolol Succinate 50 mg daily at bedtime  *If you need a refill on your cardiac medications before your next appointment, please call your pharmacy*   Lab Work: None ordered   Testing/Procedures: None ordered   Follow-Up: At [facility], you and your health needs are our priority.  As part of our continuing mission to provide you with exceptional heart care, we have created designated Provider Care Teams.  These Care Teams include your primary Cardiologist (physician) and Advanced Practice Providers (APPs -  Physician Assistants and Nurse Practitioners) who all work together to provide you with the care you need, when you need it.  Your next appointment:   1 year(s)  The format for your next appointment:   In Person  Provider:   You will see one of the following Advanced Practice Providers on your designated Care Team:   Francis Dowse, Charlott Holler [address] [place], [place] Sherie Don, NP Canary Brim, NP    Thank you for choosing [facility]!!   Dory Horn, RN [phone]

## 2023-10-22 NOTE — Progress Notes (Signed)
 Remote pacemaker transmission.

## 2023-12-08 ENCOUNTER — Ambulatory Visit (INDEPENDENT_AMBULATORY_CARE_PROVIDER_SITE_OTHER): Payer: Medicare Other

## 2023-12-08 DIAGNOSIS — I480 Paroxysmal atrial fibrillation: Secondary | ICD-10-CM

## 2023-12-09 LAB — CUP PACEART REMOTE DEVICE CHECK
Battery Remaining Longevity: 30 mo
Battery Voltage: 2.94 V
Brady Statistic AP VP Percent: 0.35 %
Brady Statistic AP VS Percent: 97.38 %
Brady Statistic AS VP Percent: 0.7 %
Brady Statistic AS VS Percent: 1.57 %
Brady Statistic RA Percent Paced: 97.32 %
Brady Statistic RV Percent Paced: 1.05 %
Date Time Interrogation Session: 20250423081039
Implantable Lead Connection Status: 753985
Implantable Lead Connection Status: 753985
Implantable Lead Implant Date: 20180123
Implantable Lead Implant Date: 20180123
Implantable Lead Location: 753859
Implantable Lead Location: 753860
Implantable Lead Model: 5076
Implantable Lead Model: 5076
Implantable Pulse Generator Implant Date: 20180123
Lead Channel Impedance Value: 323 Ohm
Lead Channel Impedance Value: 361 Ohm
Lead Channel Impedance Value: 513 Ohm
Lead Channel Impedance Value: 627 Ohm
Lead Channel Pacing Threshold Amplitude: 0.5 V
Lead Channel Pacing Threshold Amplitude: 0.625 V
Lead Channel Pacing Threshold Pulse Width: 0.4 ms
Lead Channel Pacing Threshold Pulse Width: 0.4 ms
Lead Channel Sensing Intrinsic Amplitude: 1.5 mV
Lead Channel Sensing Intrinsic Amplitude: 1.5 mV
Lead Channel Sensing Intrinsic Amplitude: 8.375 mV
Lead Channel Sensing Intrinsic Amplitude: 8.375 mV
Lead Channel Setting Pacing Amplitude: 2 V
Lead Channel Setting Pacing Amplitude: 2.5 V
Lead Channel Setting Pacing Pulse Width: 0.4 ms
Lead Channel Setting Sensing Sensitivity: 2 mV
Zone Setting Status: 755011
Zone Setting Status: 755011

## 2023-12-30 DIAGNOSIS — Z08 Encounter for follow-up examination after completed treatment for malignant neoplasm: Secondary | ICD-10-CM | POA: Diagnosis not present

## 2023-12-30 DIAGNOSIS — Z85828 Personal history of other malignant neoplasm of skin: Secondary | ICD-10-CM | POA: Diagnosis not present

## 2023-12-30 DIAGNOSIS — L821 Other seborrheic keratosis: Secondary | ICD-10-CM | POA: Diagnosis not present

## 2023-12-30 DIAGNOSIS — D1801 Hemangioma of skin and subcutaneous tissue: Secondary | ICD-10-CM | POA: Diagnosis not present

## 2023-12-30 DIAGNOSIS — L814 Other melanin hyperpigmentation: Secondary | ICD-10-CM | POA: Diagnosis not present

## 2024-01-13 DIAGNOSIS — E782 Mixed hyperlipidemia: Secondary | ICD-10-CM | POA: Diagnosis not present

## 2024-01-13 DIAGNOSIS — M810 Age-related osteoporosis without current pathological fracture: Secondary | ICD-10-CM | POA: Diagnosis not present

## 2024-01-13 DIAGNOSIS — I480 Paroxysmal atrial fibrillation: Secondary | ICD-10-CM | POA: Diagnosis not present

## 2024-01-13 DIAGNOSIS — I10 Essential (primary) hypertension: Secondary | ICD-10-CM | POA: Diagnosis not present

## 2024-01-13 DIAGNOSIS — I479 Paroxysmal tachycardia, unspecified: Secondary | ICD-10-CM | POA: Diagnosis not present

## 2024-01-13 DIAGNOSIS — Z23 Encounter for immunization: Secondary | ICD-10-CM | POA: Diagnosis not present

## 2024-01-13 DIAGNOSIS — G4700 Insomnia, unspecified: Secondary | ICD-10-CM | POA: Diagnosis not present

## 2024-01-13 DIAGNOSIS — K219 Gastro-esophageal reflux disease without esophagitis: Secondary | ICD-10-CM | POA: Diagnosis not present

## 2024-01-14 ENCOUNTER — Ambulatory Visit: Attending: Physician Assistant | Admitting: Physician Assistant

## 2024-01-14 VITALS — BP 118/68 | HR 70 | Ht 63.0 in | Wt 157.0 lb

## 2024-01-14 DIAGNOSIS — I10 Essential (primary) hypertension: Secondary | ICD-10-CM | POA: Diagnosis not present

## 2024-01-14 DIAGNOSIS — Z79899 Other long term (current) drug therapy: Secondary | ICD-10-CM | POA: Diagnosis not present

## 2024-01-14 DIAGNOSIS — Z950 Presence of cardiac pacemaker: Secondary | ICD-10-CM

## 2024-01-14 DIAGNOSIS — D6869 Other thrombophilia: Secondary | ICD-10-CM | POA: Diagnosis not present

## 2024-01-14 DIAGNOSIS — I480 Paroxysmal atrial fibrillation: Secondary | ICD-10-CM | POA: Diagnosis not present

## 2024-01-14 DIAGNOSIS — R5383 Other fatigue: Secondary | ICD-10-CM

## 2024-01-14 LAB — CUP PACEART INCLINIC DEVICE CHECK
Battery Remaining Longevity: 31 mo
Battery Voltage: 2.94 V
Brady Statistic AP VP Percent: 0.61 %
Brady Statistic AP VS Percent: 94.94 %
Brady Statistic AS VP Percent: 1.18 %
Brady Statistic AS VS Percent: 3.28 %
Brady Statistic RA Percent Paced: 94.92 %
Brady Statistic RV Percent Paced: 1.76 %
Date Time Interrogation Session: 20250530172237
Implantable Lead Connection Status: 753985
Implantable Lead Connection Status: 753985
Implantable Lead Implant Date: 20180123
Implantable Lead Implant Date: 20180123
Implantable Lead Location: 753859
Implantable Lead Location: 753860
Implantable Lead Model: 5076
Implantable Lead Model: 5076
Implantable Pulse Generator Implant Date: 20180123
Lead Channel Impedance Value: 323 Ohm
Lead Channel Impedance Value: 380 Ohm
Lead Channel Impedance Value: 532 Ohm
Lead Channel Impedance Value: 646 Ohm
Lead Channel Pacing Threshold Amplitude: 0.5 V
Lead Channel Pacing Threshold Amplitude: 0.625 V
Lead Channel Pacing Threshold Pulse Width: 0.4 ms
Lead Channel Pacing Threshold Pulse Width: 0.4 ms
Lead Channel Sensing Intrinsic Amplitude: 0.875 mV
Lead Channel Sensing Intrinsic Amplitude: 2.125 mV
Lead Channel Sensing Intrinsic Amplitude: 8.125 mV
Lead Channel Sensing Intrinsic Amplitude: 8.625 mV
Lead Channel Setting Pacing Amplitude: 2 V
Lead Channel Setting Pacing Amplitude: 2.5 V
Lead Channel Setting Pacing Pulse Width: 0.4 ms
Lead Channel Setting Sensing Sensitivity: 2 mV
Zone Setting Status: 755011
Zone Setting Status: 755011

## 2024-01-14 MED ORDER — AMIODARONE HCL 200 MG PO TABS: 200.0000 mg | ORAL_TABLET | Freq: Every day | ORAL | 2 refills | Status: AC

## 2024-01-14 NOTE — Progress Notes (Signed)
 Cardiology Office Note Date:  01/14/2024  Patient ID:  Sanari, Offner 16-Jun-1941, MRN [id number] PCP:  Faustina Hood, MD  Cardiologist: Dr. Micael Adas Electrophysiologist: Dr. Nunzio Belch    Chief Complaint:    scheduled as an urgent referral for tachycardia  History of Present Illness: KAZI MONTORO is a 83 y.o. female with history of symptomatic bradycardia w/PPM, AFib, HTN, HLD  Seeing Dr. Lesly Raspberry 2022  I saw her Feb 2023 She is ding quite well Able to do her ADL's,  cares for herself and her home independently though wishes she had better stamina. She gives an example that when raking her leaves she has to take breaks getting winded, SOB, can work longer with the blower, but enjoys raking as well. Activities that require both upper/lower body exertion tire her quicker then routine activities. No CP, no rest SOB No near syncope or syncope. She does not think she has had any AFib and is very happy about that. Her current level of exertional capacity is unchanged for over a year, but was hoping with managing the Afib, she would have better stamina, though very thankful without the AFIb feeling much improved overall. No new bleeding or signs of bleeding, some blood with BM, unchanged Encouraged her to stay active though to pace herself with the heavier activities. Low AF burden, planned for amio labs  I saw her Nov 2023 She is doing well.  Has L knee pain that limits her but denies any CP/SOB or difficulties otherwise with her ADLs No near syncope or syncope. She doesn't think she has had any AF No bleeding or signs of bleeding Had her wellness visit and labs with her PMD yesterday Very low AF burden Planned to get her labs from PMD No changes made  I saw her Nov 2024 She mentions DOE Despite prior notes mentioning no SOB, she does say that probably goes back a couple years, has much difficulty with timeline of it, but just never really mentioned it before. No rest  SOB, no nocturnal symptoms, though with exertion she gets winded Gives an example of making her bed, or light yard work, she will need to pace herself, take breaks. Gets winded and feels like she generally just gives out quicker. weaker No CP, no palpitations She doesn't think she has had any Afib and feels different then her AFib did  No dizzy spells, near syncope or syncope. She had some time that she was getting some nose bleeds but these have settled, will get hemorrhoidal bleeding from time to time, not new With new c/o SOB, planned for echo, amio held until high res CT done Low AF burden planned to transition to Dr. Lawana Pray  Echo with LVEF 65-70%, grade II DD, no significant VHD CT neg for signs of amio tox/p.fibrosis > started on lasix   She saw Dr. Lawana Pray 10/04/23, doing well, minimal SOB, reported fatigue with exertional activities Remained off amio, AF burden 0.3% BB adjusted to try and minimize fatigue  TODAY  She reports since about Easter she had had recurrent palpitations/rapid HRs and symptoms of her AFib, makes her feel even more tired then usual No energy Some DOE  Generally she wishes she had more energy, doesn't feel like she has the energy to exercise She does her ADLs but no energy left after that Even when in normal rhythm > this not new  No near syncope or syncope No rest SOB, DOE with higher exertional actovotoes  Device information MDT dual chamber  PPM implanted 09/08/2016  AFib Hx PVI ablation Oct 2013, Dec 2015, 05/21/2020  AAD Hx Sotalol  started 2018 >> stopped April 2022 with increasing AF burden Amiodarone  started April 2022 >> stopped Nov 2024 2/2 C/o SOB   Past Medical History:  Diagnosis Date   Adrenal adenoma 6/11   lt on ct 6/11   Atrial fibrillation (HCC) 01/21/2011   Atrial flutter (HCC) 05/16/2012   Basal cell carcinoma 11/25/2020   nod- Right ala nasi (MOHS)   BCC (basal cell carcinoma of skin) 05/22/2013   Right Upper Lip (curet,  cautery, and excision)   BCC (basal cell carcinoma of skin) 08/24/2013   Right Upper Lip(Sclerosis + margin) (MOH's)   Bradycardia 01/21/2011   Coronary artery calcification seen on CT scan 11/01/2018   GERD (gastroesophageal reflux disease)    Hypercholesteremia    Hypertension    Kidney stone 6/11   rt. 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 PVI 10/13 b. PVI 12/15   Superficial nodular basal cell carcinoma (BCC) 06/13/2019   Above Left Brow (curet and 5FU)   Superficial nodular basal cell carcinoma (BCC) 06/13/2019   Right Cheekbone (curet and 5FU)    Past Surgical History:  Procedure Laterality Date   ATRIAL FIBRILLATION ABLATION N/A 06/07/2012   PVI and CTI ablation by Dr Nunzio Belch   ATRIAL FIBRILLATION ABLATION N/A 07/31/2014   PVI by Dr Nunzio Belch; initial ablation 2013   ATRIAL FIBRILLATION ABLATION N/A 05/21/2020   Procedure: ATRIAL FIBRILLATION ABLATION;  Surgeon: Jolly Needle, MD;  Location: [facility] INVASIVE CV LAB;  Service: Cardiovascular;  Laterality: N/A;   CARDIOVERSION  05/06/2012   Procedure: CARDIOVERSION;  Surgeon: Jacqueline Matsu, MD;  Location: [facility] ENDOSCOPY;  Service: Cardiovascular;  Laterality: N/A;  h/p in file drawer   CARDIOVERSION N/A 10/06/2017   Procedure: CARDIOVERSION;  Surgeon: Maudine Sos, MD;  Location: [facility] ENDOSCOPY;  Service: Cardiovascular;  Laterality: N/A;   CARDIOVERSION N/A 03/28/2018   Procedure: CARDIOVERSION;  Surgeon: Liza Riggers, MD;  Location: [facility] ENDOSCOPY;  Service: Cardiovascular;  Laterality: N/A;   CARDIOVERSION N/A 02/09/2019   Procedure: CARDIOVERSION;  Surgeon: Hazle Lites, MD;  Location: [facility] ENDOSCOPY;  Service: Cardiovascular;  Laterality: N/A;   CESAREAN SECTION  1978-1979   x-2   CHOLECYSTECTOMY  1989   ELECTROPHYSIOLOGIC STUDY N/A 09/08/2016   Procedure: Cardioversion;   Surgeon: Jolly Needle, MD;  Location: [facility] INVASIVE CV LAB;  Service: Cardiovascular;  Laterality: N/A;   EP IMPLANTABLE DEVICE N/A 09/08/2016   Procedure: Loop Recorder Removal;  Surgeon: Jolly Needle, MD;  Location: [facility] INVASIVE CV LAB;  Service: Cardiovascular;  Laterality: N/A;   EP IMPLANTABLE DEVICE N/A 09/08/2016   Procedure: Pacemaker Implant;  Surgeon: Jolly Needle, MD;  Location: [facility] INVASIVE CV LAB;  Service: Cardiovascular;  Laterality: N/A;   Implantable loop recorder implantation  05/30/14   Medtronic Reveal LINQ implanted by Dr Nunzio Belch with RIO II protocol (ambulatory setting)   TEE WITHOUT CARDIOVERSION  06/06/2012   Procedure: TRANSESOPHAGEAL ECHOCARDIOGRAM (TEE);  Surgeon: Lucendia Rusk, MD;  Location: [facility] ENDOSCOPY;  Service: Cardiovascular;  Laterality: N/A;   TEE WITHOUT CARDIOVERSION N/A 07/30/2014   Procedure: TRANSESOPHAGEAL ECHOCARDIOGRAM (TEE);  Surgeon: Darlis Eisenmenger, MD;  Location: [facility] ENDOSCOPY;  Service: Cardiovascular;  Laterality: N/A;   TEE WITHOUT CARDIOVERSION N/A 10/06/2017   Procedure: TRANSESOPHAGEAL ECHOCARDIOGRAM (  TEE);  Surgeon: Maudine Sos, MD;  Location: [facility] ENDOSCOPY;  Service: Cardiovascular;  Laterality: N/A;   TEE WITHOUT CARDIOVERSION N/A 03/28/2018   Procedure: TRANSESOPHAGEAL ECHOCARDIOGRAM (TEE);  Surgeon: Liza Riggers, MD;  Location: [facility] ENDOSCOPY;  Service: Cardiovascular;  Laterality: N/A;   TEE WITHOUT CARDIOVERSION N/A 09/30/2018   Procedure: TRANSESOPHAGEAL ECHOCARDIOGRAM (TEE);  Surgeon: Euell Herrlich, MD;  Location: [facility] ENDOSCOPY;  Service: Cardiovascular;  Laterality: N/A;   TEE WITHOUT CARDIOVERSION N/A 02/09/2019   Procedure: TRANSESOPHAGEAL ECHOCARDIOGRAM (TEE);  Surgeon: Hazle Lites, MD;  Location: [facility] ENDOSCOPY;  Service: Cardiovascular;  Laterality: N/A;    Current Outpatient Medications  Medication Sig Dispense Refill   acetaminophen  (TYLENOL ) 500 MG tablet Take 250-500 mg by mouth every 6 (six) hours as needed  for moderate pain.     alum & mag hydroxide-simeth (MAALOX/MYLANTA) 200-200-20 MG/5ML suspension Take 15 mLs by mouth every 6 (six) hours as needed for indigestion or heartburn.     amiodarone  (PACERONE ) 200 MG tablet TAKE ONE TABLET BY MOUTH every DAY 90 tablet 2   amLODipine  (NORVASC ) 2.5 MG tablet TAKE ONE TABLET BY MOUTH DAILY (Patient taking differently: Take 5 mg by mouth daily.) 180 tablet 3   calcium  carbonate (OS-CAL) 600 MG TABS tablet Take 600 mg by mouth 2 (two) times daily with a meal.      calcium  carbonate (TUMS - DOSED IN MG ELEMENTAL CALCIUM ) 500 MG chewable tablet Chew 1-2 tablets by mouth 3 (three) times daily as needed for indigestion or heartburn.     Cholecalciferol (VITAMIN D) 50 MCG (2000 UT) tablet Take 2,000 Units by mouth every evening.      Ferrous Gluconate (IRON 27 PO) Take 1 tablet by mouth daily.     furosemide  (LASIX ) 20 MG tablet TAKE 1 TABLET BY MOUTH EVERY DAY 90 tablet 3   metoprolol  succinate (TOPROL  XL) 50 MG 24 hr tablet Take 1 tablet (50 mg total) by mouth daily. Take with or immediately following a meal. 30 tablet 11   pantoprazole  (PROTONIX ) 40 MG tablet Take 40 mg by mouth daily before breakfast.      Polyethyl Glycol-Propyl Glycol (SYSTANE ULTRA PF OP) Place 1 drop into both eyes daily as needed (dry eye).      polyethylene glycol (MIRALAX / GLYCOLAX) 17 g packet Take 17 g by mouth daily as needed for mild constipation or moderate constipation.     Rivaroxaban  (XARELTO ) 15 MG TABS tablet Take 1 tablet (15 mg total) by mouth daily with supper. 90 tablet 1   Rivaroxaban  (XARELTO ) 15 MG TABS tablet Take 1 tablet (15 mg total) by mouth 2 (two) times daily with a meal.     rosuvastatin  (CRESTOR ) 20 MG tablet TAKE ONE TABLET BY MOUTH ONCE DAILY 90 tablet 1   traZODone  (DESYREL ) 50 MG tablet Take 100 mg by mouth at bedtime.     Wheat Dextrin (BENEFIBER DRINK MIX PO) Take 1 Applicatorful by mouth daily.     No current facility-administered medications for this  visit.    Allergies:   Morphine and codeine and Codeine   Social History:  The patient  reports that she has never smoked. She has never used smokeless tobacco. She reports that she does not drink alcohol and does not use drugs.   Family History:  The patient's family history includes Atrial fibrillation in her sister; CAD in her father; Heart attack in her brother and father; Hip fracture in her mother; Lung cancer in her brother; Other in  her brother; Ovarian cancer in her sister; Thyroid  cancer in her sister.  ROS:  Please see the history of present illness.    All other systems are reviewed and otherwise negative.   PHYSICAL EXAM:  VS:  There were no vitals taken for this visit. BMI: There is no height or weight on file to calculate BMI. Well nourished, well developed, in no acute distress HEENT: normocephalic, atraumatic Neck: no JVD, carotid bruits or masses Cardiac: RRR; no significant murmurs, no rubs, or gallops Lungs: CTA b/l, no wheezing, rhonchi or rales Abd: soft, nontender MS: no deformity or atrophy Ext: no edema Skin: warm and dry, no rash Neuro:  No gross deficits appreciated Psych: euthymic mood, full affect  PPM site is stable, no tethering or discomfort   EKG:  done today and reviewed by myself AP/VS 70bpm  Device interrogation done today and reviewed by myself:  Battery and led measurements are good + AFib/flutter Overall burden is <1% Though has days with numerous shorter episodes Some lasting towards 2 hours  07/29/23: TTE 1. Left ventricular ejection fraction, by estimation, is 65 to 70%. Left  ventricular ejection fraction by 3D volume is 69 %. The left ventricle has  normal function. The left ventricle has no regional wall motion  abnormalities. Left ventricular diastolic   parameters are consistent with Grade II diastolic dysfunction  (pseudonormalization). Elevated left ventricular end-diastolic pressure.   2. Right ventricular systolic  function is mildly reduced. The right  ventricular size is normal. There is normal pulmonary artery systolic  pressure. The estimated right ventricular systolic pressure is 31.7 mmHg.   3. Left atrial size was severely dilated.   4. Right atrial size was moderately dilated.   5. The mitral valve is abnormal. Mild mitral valve regurgitation.   6. Tricuspid valve regurgitation is mild to moderate.   7. The aortic valve is calcified. Aortic valve regurgitation is trivial.  Aortic valve sclerosis and calcification without significant stenosis.  Aortic valve area, by VTI measures 1.76 cm. Aortic valve mean gradient  measures 9.5 mmHg. Aortic valve Vmax  measures 2.03 m/s. Peak gradient 16.5 mmHg, DI 0.56.   8. The inferior vena cava is normal in size with greater than 50%  respiratory variability, suggesting right atrial pressure of 3 mmHg.    04/24/2020: TTE IMPRESSIONS   1. Left ventricular ejection fraction, by estimation, is 60 to 65%. The  left ventricle has normal function. The left ventricle has no regional  wall motion abnormalities. Left ventricular diastolic parameters are  consistent with Grade II diastolic  dysfunction (pseudonormalization).   2. Right ventricular systolic function is normal. The right ventricular  size is mildly enlarged. There is normal pulmonary artery systolic  pressure. The estimated right ventricular systolic pressure is 33.5 mmHg.   3. Left atrial size was moderately dilated.   4. Right atrial size was mildly dilated.   5. The mitral valve is normal in structure. No evidence of mitral valve  regurgitation. No evidence of mitral stenosis.   6. The aortic valve is tricuspid. Aortic valve regurgitation is trivial.  Mild to moderate aortic valve sclerosis/calcification is present, without  any evidence of aortic stenosis. Aortic valve mean gradient measures 5.0  mmHg. Aortic valve Vmax measures  1.54 m/s.   7. The inferior vena cava is normal in size with  greater than 50%  respiratory variability, suggesting right atrial pressure of 3 mmHg.   Comparison(s): No significant change from prior study. Prior images  reviewed side  by side.    05/21/2020: EPS/Ablation CONCLUSIONS: 1.  Sinus rhythm upon presentation.   2. Intracardiac echo reveals a moderate sized left atrium with four separate pulmonary veins without evidence of pulmonary vein stenosis. 3. Minimal return of conduction along the carina between the left superior and inferior pulmonary veins.  Ablation was performed at this location.   4. Return of electrical activity along the right pulmonary veins with successful sequential electrical re isolation and anatomical encircling of right right pulmonary veins using a WACA approach 5. Additional left atrial ablation was performed with a standard box lesion created along the posterior wall of the left atrium 6. No early apparent complications.   12/14/2018: stress myoview  The left ventricular ejection fraction is normal (55-65%). Nuclear stress EF: 61%. No T wave inversion was noted during stress. This is a low risk study.   Normal perfusion. LVEF 61% with normal wall motion. This is a low risk study.      Recent Labs: 06/21/2023: ALT 34; Hemoglobin 14.8; Platelets 227; TSH 0.963 08/13/2023: BUN 16; Creatinine, Ser 1.01; Potassium 4.8; Sodium 141  No results found for requested labs within last 365 days.   CrCl cannot be calculated (Patient's most recent lab result is older than the maximum 21 days allowed.).   Wt Readings from Last 3 Encounters:  10/04/23 155 lb 12.8 oz (70.7 kg)  06/21/23 156 lb 3.2 oz (70.9 kg)  06/17/22 151 lb (68.5 kg)     Other studies reviewed: Additional studies/records reviewed today include: summarized above  ASSESSMENT AND PLAN:  PPM Intact function No programming changes  Paroxysmal Afib CHA2DS2Vasc is 4, on Xarelto , appropriately dosed at 15mg  daily  0.7 % burden Symptomatic AF Resume  amiodarone  400mg  BID x1 week > 400mg  daily for 1 week > 200mg  daily Baseline labs today  HTN Looks ok  4. Fatigue Somewhat chronic Once back on amiodarone  and AF settles down will try to peel away her betablocker  5.  Secondary hypercoagulable state   Disposition: back in 2-3, sooner if needed   Current medicines are reviewed at length with the patient today.  The patient did not have any concerns regarding medicines.  [place], PA-C 01/14/2024 7:46 AM     [facility] [address] [place] [address] [phone] (office)  [phone] (fax)

## 2024-01-14 NOTE — Patient Instructions (Addendum)
 Medication Instructions:    START TAKING :  AMIODARONE  200 MG    FOR FIRST WEEK ONLY : TAKE 400 MG TWICE A DAY     FOR  SECOND WEEK ONLY:  TAKE  400 MG ONCE  A DAY     THEN RESUME TAKING 200 MG ONCE A DAY    *If you need a refill on your cardiac medications before your next appointment, please call your pharmacy*    Lab Work:   PLEASE GO DOWN STAIRS  [facility]  FIRST FLOOR   ( GET OFF ELEVATORS WALK TOWARDS WAITING AREA LAB LOCATED BY PHARMACY):  CMET CBC AND TSH    If you have labs (blood work) drawn today and your tests are completely normal, you will receive your results only by: MyChart Message (if you have MyChart) OR A paper copy in the mail If you have any lab test that is abnormal or we need to change your treatment, we will call you to review the results.     Testing/Procedures: NONE ORDERED  TODAY    Follow-Up: At [facility], you and your health needs are our priority.  As part of our continuing mission to provide you with exceptional heart care, our providers are all part of one team.  This team includes your primary Cardiologist (physician) and Advanced Practice Providers or APPs (Physician Assistants and Nurse Practitioners) who all work together to provide you with the care you need, when you need it.   Your next appointment:     2 month(s) ( CONTACT  CASSIE HALL/ ANGELINE HAMMER FOR EP SCHEDULING ISSUES )    Provider:    You may see the following Advanced Practice Providers on your designated Care Team:   Mertha Abrahams, [place]    We recommend signing up for the patient portal called "MyChart".  Sign up information is provided on this After Visit Summary.  MyChart is used to connect with patients for Virtual Visits (Telemedicine).  Patients are able to view lab/test results, encounter notes, upcoming appointments, etc.  Non-urgent messages can be sent to your provider as well.   To learn more about what you can do with MyChart, go to  [email or web address].     Other Instructions

## 2024-01-15 LAB — COMPREHENSIVE METABOLIC PANEL WITH GFR
ALT: 18 IU/L (ref 0–32)
AST: 23 IU/L (ref 0–40)
Albumin: 4.3 g/dL (ref 3.7–4.7)
Alkaline Phosphatase: 106 IU/L (ref 44–121)
BUN/Creatinine Ratio: 18 (ref 12–28)
BUN: 19 mg/dL (ref 8–27)
Bilirubin Total: 0.6 mg/dL (ref 0.0–1.2)
CO2: 25 mmol/L (ref 20–29)
Calcium: 10.3 mg/dL (ref 8.7–10.3)
Chloride: 100 mmol/L (ref 96–106)
Creatinine, Ser: 1.05 mg/dL — ABNORMAL HIGH (ref 0.57–1.00)
Globulin, Total: 2.7 g/dL (ref 1.5–4.5)
Glucose: 101 mg/dL — ABNORMAL HIGH (ref 70–99)
Potassium: 5.4 mmol/L — ABNORMAL HIGH (ref 3.5–5.2)
Sodium: 138 mmol/L (ref 134–144)
Total Protein: 7 g/dL (ref 6.0–8.5)
eGFR: 53 mL/min/{1.73_m2} — ABNORMAL LOW (ref >59)

## 2024-01-15 LAB — TSH: TSH: 1.2 u[IU]/mL (ref 0.450–4.500)

## 2024-01-15 LAB — CBC
Hematocrit: 43.6 % (ref 34.0–46.6)
Hemoglobin: 13.3 g/dL (ref 11.1–15.9)
MCH: 29 pg (ref 26.6–33.0)
MCHC: 30.5 g/dL — ABNORMAL LOW (ref 31.5–35.7)
MCV: 95 fL (ref 79–97)
Platelets: 209 10*3/uL (ref 150–450)
RBC: 4.58 x10E6/uL (ref 3.77–5.28)
RDW: 13 % (ref 11.7–15.4)
WBC: 6.5 10*3/uL (ref 3.4–10.8)

## 2024-01-16 ENCOUNTER — Ambulatory Visit: Payer: Self-pay | Admitting: Cardiology

## 2024-01-20 NOTE — Addendum Note (Signed)
 Addended by: Lott Rouleau A on: 01/20/2024 11:22 AM   Modules accepted: Orders

## 2024-01-20 NOTE — Progress Notes (Signed)
 Remote pacemaker transmission.

## 2024-01-21 ENCOUNTER — Other Ambulatory Visit: Payer: Self-pay | Admitting: *Deleted

## 2024-01-21 DIAGNOSIS — Z79899 Other long term (current) drug therapy: Secondary | ICD-10-CM

## 2024-01-26 DIAGNOSIS — Z79899 Other long term (current) drug therapy: Secondary | ICD-10-CM | POA: Diagnosis not present

## 2024-01-27 ENCOUNTER — Ambulatory Visit: Payer: Self-pay | Admitting: Physician Assistant

## 2024-01-27 LAB — POTASSIUM: Potassium: 4.6 mmol/L (ref 3.5–5.2)

## 2024-02-11 DIAGNOSIS — N1831 Chronic kidney disease, stage 3a: Secondary | ICD-10-CM | POA: Diagnosis not present

## 2024-02-14 DIAGNOSIS — I10 Essential (primary) hypertension: Secondary | ICD-10-CM | POA: Diagnosis not present

## 2024-02-14 DIAGNOSIS — E782 Mixed hyperlipidemia: Secondary | ICD-10-CM | POA: Diagnosis not present

## 2024-02-14 DIAGNOSIS — I480 Paroxysmal atrial fibrillation: Secondary | ICD-10-CM | POA: Diagnosis not present

## 2024-03-08 ENCOUNTER — Ambulatory Visit: Payer: Medicare Other

## 2024-03-08 DIAGNOSIS — I480 Paroxysmal atrial fibrillation: Secondary | ICD-10-CM

## 2024-03-09 LAB — CUP PACEART REMOTE DEVICE CHECK
Battery Remaining Longevity: 27 mo
Battery Voltage: 2.94 V
Brady Statistic AP VP Percent: 0.61 %
Brady Statistic AP VS Percent: 95.8 %
Brady Statistic AS VP Percent: 0.24 %
Brady Statistic AS VS Percent: 3.35 %
Brady Statistic RA Percent Paced: 96.17 %
Brady Statistic RV Percent Paced: 0.89 %
Date Time Interrogation Session: 20250723102624
Implantable Lead Connection Status: 753985
Implantable Lead Connection Status: 753985
Implantable Lead Implant Date: 20180123
Implantable Lead Implant Date: 20180123
Implantable Lead Location: 753859
Implantable Lead Location: 753860
Implantable Lead Model: 5076
Implantable Lead Model: 5076
Implantable Pulse Generator Implant Date: 20180123
Lead Channel Impedance Value: 323 Ohm
Lead Channel Impedance Value: 361 Ohm
Lead Channel Impedance Value: 532 Ohm
Lead Channel Impedance Value: 627 Ohm
Lead Channel Pacing Threshold Amplitude: 0.5 V
Lead Channel Pacing Threshold Amplitude: 0.875 V
Lead Channel Pacing Threshold Pulse Width: 0.4 ms
Lead Channel Pacing Threshold Pulse Width: 0.4 ms
Lead Channel Sensing Intrinsic Amplitude: 2.625 mV
Lead Channel Sensing Intrinsic Amplitude: 2.625 mV
Lead Channel Sensing Intrinsic Amplitude: 9 mV
Lead Channel Sensing Intrinsic Amplitude: 9 mV
Lead Channel Setting Pacing Amplitude: 2 V
Lead Channel Setting Pacing Amplitude: 2.5 V
Lead Channel Setting Pacing Pulse Width: 0.4 ms
Lead Channel Setting Sensing Sensitivity: 2 mV
Zone Setting Status: 755011
Zone Setting Status: 755011

## 2024-03-12 NOTE — Progress Notes (Unsigned)
 Cardiology Office Note Date:  03/12/2024  Patient ID:  Monica [name], Monica [name] [birth year], MRN [id number] PCP:  Claudene Pellet, MD  Cardiologist: Dr. Shlomo Electrophysiologist: Dr. Kelsie    Chief Complaint:    scheduled as an urgent referral for tachycardia  History of Present Illness: Monica [name] is a 83 y.o. female with history of symptomatic bradycardia w/PPM, AFib, HTN, HLD  Seeing Dr. Rochester 2022  I saw her Feb 2023 She is ding quite well Able to do her ADL's,  cares for herself and her home independently though wishes she had better stamina. She gives an example that when raking her leaves she has to take breaks getting winded, SOB, can work longer with the blower, but enjoys raking as well. Activities that require both upper/lower body exertion tire her quicker then routine activities. No CP, no rest SOB No near syncope or syncope. She does not think she has had any AFib and is very happy about that. Her current level of exertional capacity is unchanged for over a year, but was hoping with managing the Afib, she would have better stamina, though very thankful without the AFIb feeling much improved overall. No new bleeding or signs of bleeding, some blood with BM, unchanged Encouraged her to stay active though to pace herself with the heavier activities. Low AF burden, planned for amio labs  I saw her Nov 2023 She is doing well.  Has L knee pain that limits her but denies any CP/SOB or difficulties otherwise with her ADLs No near syncope or syncope. She doesn't think she has had any AF No bleeding or signs of bleeding Had her wellness visit and labs with her PMD yesterday Very low AF burden Planned to get her labs from PMD No changes made  I saw her Nov 2024 She mentions DOE Despite prior notes mentioning no SOB, she does say that probably goes back a couple years, has much difficulty with timeline of it, but just never really mentioned it before. No rest  SOB, no nocturnal symptoms, though with exertion she gets winded Gives an example of making her bed, or light yard work, she will need to pace herself, take breaks. Gets winded and feels like she generally just gives out quicker. weaker No CP, no palpitations She doesn't think she has had any Afib and feels different then her AFib did  No dizzy spells, near syncope or syncope. She had some time that she was getting some nose bleeds but these have settled, will get hemorrhoidal bleeding from time to time, not new With new c/o SOB, planned for echo, amio held until high res CT done Low AF burden planned to transition to Dr. Inocencio  Echo with LVEF 65-70%, grade II DD, no significant VHD CT neg for signs of amio tox/p.fibrosis > started on lasix   She saw Dr. Inocencio 10/04/23, doing well, minimal SOB, reported fatigue with exertional activities Remained off amio, AF burden 0.3% BB adjusted to try and minimize fatigue  I saw her 01/14/24 She reports since about Easter she had had recurrent palpitations/rapid HRs and symptoms of her AFib, makes her feel even more tired then usual No energy Some DOE Generally she wishes she had more energy, doesn't feel like she has the energy to exercise She does her ADLs but no energy left after that Even when in normal rhythm > this not new No near syncope or syncope No rest SOB, DOE with higher exertional activities Low AF burden, but symptomatic Amiodarone   re-started with plans to wean off her BB once back on AAD to try and improve her fatigue  TODAY  *** amio labs *** more SOB with it at all/. 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  AFib Hx PVI ablation Oct 2013, Dec 2015, 05/21/2020  AAD Hx Sotalol  started 2018 >> stopped April 2022 with increasing AF burden Amiodarone  started April 2022 >> stopped Nov 2024 2/2 c/o SOB(CT neg for signs of amio  tox/p.fibrosis) Amiodarone  re-started may 2025  Past Medical History:  Diagnosis Date   Adrenal adenoma 6/11   lt on ct 6/11   Atrial fibrillation (HCC) 01/21/2011   Atrial flutter (HCC) 05/16/2012   Basal cell carcinoma 11/25/2020   nod- Right ala nasi (MOHS)   BCC (basal cell carcinoma of skin) 05/22/2013   Right Upper Lip (curet, cautery, and excision)   BCC (basal cell carcinoma of skin) 08/24/2013   Right Upper Lip(Sclerosis + margin) (MOH's)   Bradycardia 01/21/2011   Coronary artery calcification seen on CT scan 11/01/2018   GERD (gastroesophageal reflux disease)    Hypercholesteremia    Hypertension    Kidney stone 6/11   rt. 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 PVI 10/13 b. PVI 12/15   Superficial nodular basal cell carcinoma (BCC) 06/13/2019   Above Left Brow (curet and 5FU)   Superficial nodular basal cell carcinoma (BCC) 06/13/2019   Right Cheekbone (curet and 5FU)    Past Surgical History:  Procedure Laterality Date   ATRIAL FIBRILLATION ABLATION N/A 06/07/2012   PVI and CTI ablation by Dr Kelsie   ATRIAL FIBRILLATION ABLATION N/A 07/31/2014   PVI by Dr Kelsie; initial ablation 2013   ATRIAL FIBRILLATION ABLATION N/A 05/21/2020   Procedure: ATRIAL FIBRILLATION ABLATION;  Surgeon: Kelsie Agent, MD;  Location: [facility] INVASIVE CV LAB;  Service: Cardiovascular;  Laterality: N/A;   CARDIOVERSION  05/06/2012   Procedure: CARDIOVERSION;  Surgeon: Wilbert JONELLE Bihari, MD;  Location: [facility] ENDOSCOPY;  Service: Cardiovascular;  Laterality: N/A;  h/p in file drawer   CARDIOVERSION N/A 10/06/2017   Procedure: CARDIOVERSION;  Surgeon: Raford Riggs, MD;  Location: [facility] ENDOSCOPY;  Service: Cardiovascular;  Laterality: N/A;   CARDIOVERSION N/A 03/28/2018   Procedure: CARDIOVERSION;  Surgeon: Maranda Leim DEL, MD;  Location: [facility] ENDOSCOPY;   Service: Cardiovascular;  Laterality: N/A;   CARDIOVERSION N/A 02/09/2019   Procedure: CARDIOVERSION;  Surgeon: Mona Vinie BROCKS, MD;  Location: [facility] ENDOSCOPY;  Service: Cardiovascular;  Laterality: N/A;   CESAREAN SECTION  1978-1979   x-2   CHOLECYSTECTOMY  1989   ELECTROPHYSIOLOGIC STUDY N/A 09/08/2016   Procedure: Cardioversion;  Surgeon: Agent Kelsie, MD;  Location: [facility] INVASIVE CV LAB;  Service: Cardiovascular;  Laterality: N/A;   EP IMPLANTABLE DEVICE N/A 09/08/2016   Procedure: Loop Recorder Removal;  Surgeon: Agent Kelsie, MD;  Location: [facility] INVASIVE CV LAB;  Service: Cardiovascular;  Laterality: N/A;   EP IMPLANTABLE DEVICE N/A 09/08/2016   Procedure: Pacemaker Implant;  Surgeon: Agent Kelsie, MD;  Location: [facility] INVASIVE CV LAB;  Service: Cardiovascular;  Laterality: N/A;   Implantable loop recorder implantation  05/30/14   Medtronic Reveal LINQ implanted by Dr Kelsie with RIO II protocol (ambulatory setting)   TEE WITHOUT CARDIOVERSION  06/06/2012   Procedure:  TRANSESOPHAGEAL ECHOCARDIOGRAM (TEE);  Surgeon: Candyce CANDIE Reek, MD;  Location: [facility] ENDOSCOPY;  Service: Cardiovascular;  Laterality: N/A;   TEE WITHOUT CARDIOVERSION N/A 07/30/2014   Procedure: TRANSESOPHAGEAL ECHOCARDIOGRAM (TEE);  Surgeon: Ezra GORMAN Shuck, MD;  Location: [facility] ENDOSCOPY;  Service: Cardiovascular;  Laterality: N/A;   TEE WITHOUT CARDIOVERSION N/A 10/06/2017   Procedure: TRANSESOPHAGEAL ECHOCARDIOGRAM (TEE);  Surgeon: Raford Riggs, MD;  Location: [facility] ENDOSCOPY;  Service: Cardiovascular;  Laterality: N/A;   TEE WITHOUT CARDIOVERSION N/A 03/28/2018   Procedure: TRANSESOPHAGEAL ECHOCARDIOGRAM (TEE);  Surgeon: Maranda Leim DEL, MD;  Location: [facility] ENDOSCOPY;  Service: Cardiovascular;  Laterality: N/A;   TEE WITHOUT CARDIOVERSION N/A 09/30/2018   Procedure: TRANSESOPHAGEAL ECHOCARDIOGRAM (TEE);  Surgeon: Loni Soyla LABOR, MD;  Location: [facility] ENDOSCOPY;  Service: Cardiovascular;  Laterality: N/A;   TEE WITHOUT  CARDIOVERSION N/A 02/09/2019   Procedure: TRANSESOPHAGEAL ECHOCARDIOGRAM (TEE);  Surgeon: Mona Vinie BROCKS, MD;  Location: [facility] ENDOSCOPY;  Service: Cardiovascular;  Laterality: N/A;    Current Outpatient Medications  Medication Sig Dispense Refill   acetaminophen  (TYLENOL ) 500 MG tablet Take 250-500 mg by mouth every 6 (six) hours as needed for moderate pain.     alum & mag hydroxide-simeth (MAALOX/MYLANTA) 200-200-20 MG/5ML suspension Take 15 mLs by mouth every 6 (six) hours as needed for indigestion or heartburn.     amiodarone  (PACERONE ) 200 MG tablet Take 1 tablet (200 mg total) by mouth daily. 90 tablet 2   amLODipine  (NORVASC ) 2.5 MG tablet TAKE ONE TABLET BY MOUTH DAILY (Patient taking differently: Take 5 mg by mouth daily.) 180 tablet 3   calcium  carbonate (OS-CAL) 600 MG TABS tablet Take 600 mg by mouth 2 (two) times daily with a meal.      calcium  carbonate (TUMS - DOSED IN MG ELEMENTAL CALCIUM ) 500 MG chewable tablet Chew 1-2 tablets by mouth 3 (three) times daily as needed for indigestion or heartburn.     Cholecalciferol (VITAMIN D) 50 MCG (2000 UT) tablet Take 2,000 Units by mouth every evening.      Ferrous Gluconate (IRON 27 PO) Take 1 tablet by mouth daily.     furosemide  (LASIX ) 20 MG tablet TAKE 1 TABLET BY MOUTH EVERY DAY 90 tablet 3   metoprolol  succinate (TOPROL  XL) 50 MG 24 hr tablet Take 1 tablet (50 mg total) by mouth daily. Take with or immediately following a meal. 30 tablet 11   pantoprazole  (PROTONIX ) 40 MG tablet Take 40 mg by mouth daily before breakfast.      Polyethyl Glycol-Propyl Glycol (SYSTANE ULTRA PF OP) Place 1 drop into both eyes daily as needed (dry eye).      polyethylene glycol (MIRALAX / GLYCOLAX) 17 g packet Take 17 g by mouth daily as needed for mild constipation or moderate constipation.     Rivaroxaban  (XARELTO ) 15 MG TABS tablet Take 1 tablet (15 mg total) by mouth daily with supper. 90 tablet 1   rosuvastatin  (CRESTOR ) 20 MG tablet TAKE ONE TABLET  BY MOUTH ONCE DAILY 90 tablet 1   traZODone  (DESYREL ) 50 MG tablet Take 100 mg by mouth at bedtime.     Wheat Dextrin (BENEFIBER DRINK MIX PO) Take 1 Applicatorful by mouth daily.     No current facility-administered medications for this visit.    Allergies:   Morphine and codeine and Codeine   Social History:  The patient  reports that she has never smoked. She has never used smokeless tobacco. She reports that she does not drink alcohol and does not use drugs.  Family History:  The patient's family history includes Atrial fibrillation in her sister; CAD in her father; Heart attack in her brother and father; Hip fracture in her mother; Lung cancer in her brother; Other in her brother; Ovarian cancer in her sister; Thyroid  cancer in her sister.  ROS:  Please see the history of present illness.    All other systems are reviewed and otherwise negative.   PHYSICAL EXAM:  VS:  There were no vitals taken for this visit. BMI: There is no height or weight on file to calculate BMI. Well nourished, well developed, in no acute distress HEENT: normocephalic, atraumatic Neck: no JVD, carotid bruits or masses Cardiac: *** RRR; no significant murmurs, no rubs, or gallops Lungs: *** CTA b/l, no wheezing, rhonchi or rales Abd: soft, nontender MS: no deformity or atrophy Ext: ** no edema Skin: warm and dry, no rash Neuro:  No gross deficits appreciated Psych: euthymic mood, full affect  *** PPM site is stable, no tethering or discomfort   EKG:  not done today  Device interrogation done today and reviewed by myself:  *** Battery and led measurements are good ***   07/29/23: TTE 1. Left ventricular ejection fraction, by estimation, is 65 to 70%. Left  ventricular ejection fraction by 3D volume is 69 %. The left ventricle has  normal function. The left ventricle has no regional wall motion  abnormalities. Left ventricular diastolic   parameters are consistent with Grade II diastolic  dysfunction  (pseudonormalization). Elevated left ventricular end-diastolic pressure.   2. Right ventricular systolic function is mildly reduced. The right  ventricular size is normal. There is normal pulmonary artery systolic  pressure. The estimated right ventricular systolic pressure is 31.7 mmHg.   3. Left atrial size was severely dilated.   4. Right atrial size was moderately dilated.   5. The mitral valve is abnormal. Mild mitral valve regurgitation.   6. Tricuspid valve regurgitation is mild to moderate.   7. The aortic valve is calcified. Aortic valve regurgitation is trivial.  Aortic valve sclerosis and calcification without significant stenosis.  Aortic valve area, by VTI measures 1.76 cm. Aortic valve mean gradient  measures 9.5 mmHg. Aortic valve Vmax  measures 2.03 m/s. Peak gradient 16.5 mmHg, DI 0.56.   8. The inferior vena cava is normal in size with greater than 50%  respiratory variability, suggesting right atrial pressure of 3 mmHg.    04/24/2020: TTE IMPRESSIONS   1. Left ventricular ejection fraction, by estimation, is 60 to 65%. The  left ventricle has normal function. The left ventricle has no regional  wall motion abnormalities. Left ventricular diastolic parameters are  consistent with Grade II diastolic  dysfunction (pseudonormalization).   2. Right ventricular systolic function is normal. The right ventricular  size is mildly enlarged. There is normal pulmonary artery systolic  pressure. The estimated right ventricular systolic pressure is 33.5 mmHg.   3. Left atrial size was moderately dilated.   4. Right atrial size was mildly dilated.   5. The mitral valve is normal in structure. No evidence of mitral valve  regurgitation. No evidence of mitral stenosis.   6. The aortic valve is tricuspid. Aortic valve regurgitation is trivial.  Mild to moderate aortic valve sclerosis/calcification is present, without  any evidence of aortic stenosis. Aortic valve mean  gradient measures 5.0  mmHg. Aortic valve Vmax measures  1.54 m/s.   7. The inferior vena cava is normal in size with greater than 50%  respiratory variability, suggesting  right atrial pressure of 3 mmHg.   Comparison(s): No significant change from prior study. Prior images  reviewed side by side.    05/21/2020: EPS/Ablation CONCLUSIONS: 1.  Sinus rhythm upon presentation.   2. Intracardiac echo reveals a moderate sized left atrium with four separate pulmonary veins without evidence of pulmonary vein stenosis. 3. Minimal return of conduction along the carina between the left superior and inferior pulmonary veins.  Ablation was performed at this location.   4. Return of electrical activity along the right pulmonary veins with successful sequential electrical re isolation and anatomical encircling of right right pulmonary veins using a WACA approach 5. Additional left atrial ablation was performed with a standard box lesion created along the posterior wall of the left atrium 6. No early apparent complications.   12/14/2018: stress myoview  The left ventricular ejection fraction is normal (55-65%). Nuclear stress EF: 61%. No T wave inversion was noted during stress. This is a low risk study.   Normal perfusion. LVEF 61% with normal wall motion. This is a low risk study.      Recent Labs: 01/14/2024: ALT 18; BUN 19; Creatinine, Ser 1.05; Hemoglobin 13.3; Platelets 209; Sodium 138; TSH 1.200 01/26/2024: Potassium 4.6  No results found for requested labs within last 365 days.   CrCl cannot be calculated (Patient's most recent lab result is older than the maximum 21 days allowed.).   Wt Readings from Last 3 Encounters:  01/14/24 157 lb (71.2 kg)  10/04/23 155 lb 12.8 oz (70.7 kg)  06/21/23 156 lb 3.2 oz (70.9 kg)     Other studies reviewed: Additional studies/records reviewed today include: summarized above  ASSESSMENT AND PLAN:  PPM *** Intact function *** No programming  changes  Paroxysmal Afib CHA2DS2Vasc is 4, on Xarelto , *** appropriately dosed   *** % burden Back on amiodarone   HTN *** Looks ok  4. Fatigue Somewhat chronic ***  5.  Secondary hypercoagulable state   Disposition: back in ***, sooner if needed   Current medicines are reviewed at length with the patient today.  The patient did not have any concerns regarding medicines.  Bonney Charlies Arthur, PA-C 03/12/2024 1:38 PM     [facility] [address] [place] [address] [phone] (office)  [phone] (fax)

## 2024-03-13 ENCOUNTER — Ambulatory Visit: Payer: Self-pay | Admitting: Cardiology

## 2024-03-15 ENCOUNTER — Encounter: Payer: Self-pay | Admitting: Physician Assistant

## 2024-03-15 ENCOUNTER — Ambulatory Visit: Attending: Cardiology | Admitting: Physician Assistant

## 2024-03-15 VITALS — BP 150/86 | HR 80 | Ht 63.0 in | Wt 157.1 lb

## 2024-03-15 DIAGNOSIS — Z950 Presence of cardiac pacemaker: Secondary | ICD-10-CM

## 2024-03-15 DIAGNOSIS — Z79899 Other long term (current) drug therapy: Secondary | ICD-10-CM | POA: Diagnosis not present

## 2024-03-15 DIAGNOSIS — I10 Essential (primary) hypertension: Secondary | ICD-10-CM | POA: Diagnosis not present

## 2024-03-15 DIAGNOSIS — I480 Paroxysmal atrial fibrillation: Secondary | ICD-10-CM

## 2024-03-15 DIAGNOSIS — D6869 Other thrombophilia: Secondary | ICD-10-CM

## 2024-03-15 NOTE — Patient Instructions (Signed)
 Medication Instructions:   FOR 5 DAYS:  TAKE 1/2 TABLET  METOPROLOL  25 MG ONCE A DAY   FOR 5 DOSES ONLY:  TAKE 1/2 TABLET  25 MG EVERY OTHER DAY  THEN STOP TAKING COMPLETELY    *If you need a refill on your cardiac medications before your next appointment, please call your pharmacy*  Lab Work:   PLEASE GO DOWN STAIRS  [facility]  FIRST FLOOR   ( GET OFF ELEVATORS WALK TOWARDS WAITING AREA LAB LOCATED BY PHARMACY): LFT  AND TSH TODAY    If you have labs (blood work) drawn today and your tests are completely normal, you will receive your results only by: MyChart Message (if you have MyChart) OR A paper copy in the mail If you have any lab test that is abnormal or we need to change your treatment, we will call you to review the results.    Testing/Procedures:  NONE ORDERED  TODAY    Follow-Up: At [facility], you and your health needs are our priority.  As part of our continuing mission to provide you with exceptional heart care, our providers are all part of one team.  This team includes your primary Cardiologist (physician) and Advanced Practice Providers or APPs (Physician Assistants and Nurse Practitioners) who all work together to provide you with the care you need, when you need it.  Your next appointment:    2 month(s) ( CONTACT  CASSIE HALL/ ANGELINE HAMMER FOR EP SCHEDULING ISSUES )   Provider:    Charlies Arthur, PA-C    We recommend signing up for the patient portal called MyChart.  Sign up information is provided on this After Visit Summary.  MyChart is used to connect with patients for Virtual Visits (Telemedicine).  Patients are able to view lab/test results, encounter notes, upcoming appointments, etc.  Non-urgent messages can be sent to your provider as well.   To learn more about what you can do with MyChart, go to [email or web address].   Other Instructions

## 2024-03-16 ENCOUNTER — Ambulatory Visit: Payer: Self-pay | Admitting: Physician Assistant

## 2024-03-16 DIAGNOSIS — E782 Mixed hyperlipidemia: Secondary | ICD-10-CM | POA: Diagnosis not present

## 2024-03-16 DIAGNOSIS — I480 Paroxysmal atrial fibrillation: Secondary | ICD-10-CM | POA: Diagnosis not present

## 2024-03-16 DIAGNOSIS — I10 Essential (primary) hypertension: Secondary | ICD-10-CM | POA: Diagnosis not present

## 2024-03-16 LAB — HEPATIC FUNCTION PANEL
ALT: 18 IU/L (ref 0–32)
AST: 22 IU/L (ref 0–40)
Albumin: 4.4 g/dL (ref 3.7–4.7)
Alkaline Phosphatase: 105 IU/L (ref 44–121)
Bilirubin Total: 0.5 mg/dL (ref 0.0–1.2)
Bilirubin, Direct: 0.21 mg/dL (ref 0.00–0.40)
Total Protein: 7.1 g/dL (ref 6.0–8.5)

## 2024-03-16 LAB — TSH: TSH: 1.43 u[IU]/mL (ref 0.450–4.500)

## 2024-04-16 DIAGNOSIS — I480 Paroxysmal atrial fibrillation: Secondary | ICD-10-CM | POA: Diagnosis not present

## 2024-04-16 DIAGNOSIS — E782 Mixed hyperlipidemia: Secondary | ICD-10-CM | POA: Diagnosis not present

## 2024-04-16 DIAGNOSIS — I10 Essential (primary) hypertension: Secondary | ICD-10-CM | POA: Diagnosis not present

## 2024-05-10 NOTE — Progress Notes (Unsigned)
 Cardiology Office Note Date:  05/10/2024  Patient ID:  Monica [name] 1941-04-22, MRN [id number] PCP:  Claudene Pellet, MD  Cardiologist: Dr. Shlomo Electrophysiologist: Dr. Kelsie >> Dr. Inocencio    Chief Complaint:    scheduled as a planned f/u  History of Present Illness: Monica [name] is a 83 y.o. female with history of symptomatic bradycardia w/PPM, AFib, HTN, HLD  Seeing Dr. Rochester 2022  I saw her Feb 2023 She is ding quite well Able to do her ADL's,  cares for herself and her home independently though wishes she had better stamina. She gives an example that when raking her leaves she has to take breaks getting winded, SOB, can work longer with the blower, but enjoys raking as well. Activities that require both upper/lower body exertion tire her quicker then routine activities. No CP, no rest SOB No near syncope or syncope. She does not think she has had any AFib and is very happy about that. Her current level of exertional capacity is unchanged for over a year, but was hoping with managing the Afib, she would have better stamina, though very thankful without the AFIb feeling much improved overall. No new bleeding or signs of bleeding, some blood with BM, unchanged Encouraged her to stay active though to pace herself with the heavier activities. Low AF burden, planned for amio labs  I saw her Nov 2023 She is doing well.  Has L knee pain that limits her but denies any CP/SOB or difficulties otherwise with her ADLs No near syncope or syncope. She doesn't think she has had any AF No bleeding or signs of bleeding Had her wellness visit and labs with her PMD yesterday Very low AF burden Planned to get her labs from PMD No changes made  I saw her Nov 2024 She mentions DOE Despite prior notes mentioning no SOB, she does say that probably goes back a couple years, has much difficulty with timeline of it, but just never really mentioned it before. No rest SOB,  no nocturnal symptoms, though with exertion she gets winded Gives an example of making her bed, or light yard work, she will need to pace herself, take breaks. Gets winded and feels like she generally just gives out quicker. weaker No CP, no palpitations She doesn't think she has had any Afib and feels different then her AFib did  No dizzy spells, near syncope or syncope. She had some time that she was getting some nose bleeds but these have settled, will get hemorrhoidal bleeding from time to time, not new With new c/o SOB, planned for echo, amio held until high res CT done Low AF burden planned to transition to Dr. Inocencio  Echo with LVEF 65-70%, grade II DD, no significant VHD CT neg for signs of amio tox/p.fibrosis > started on lasix   She saw Dr. Inocencio 10/04/23, doing well, minimal SOB, reported fatigue with exertional activities Remained off amio, AF burden 0.3% BB adjusted to try and minimize fatigue  I saw her 01/14/24 She reports since about Easter she had had recurrent palpitations/rapid HRs and symptoms of her AFib, makes her feel even more tired then usual No energy Some DOE Generally she wishes she had more energy, doesn't feel like she has the energy to exercise She does her ADLs but no energy left after that Even when in normal rhythm > this not new No near syncope or syncope No rest SOB, DOE with higher exertional activities Low AF burden, but symptomatic  Amiodarone  re-started with plans to wean off her BB once back on AAD to try and improve her fatigue  TODAY  She feels better Feels much less if any AF, at least none that she has felt/been aware of. She remain with poor energy Says she has the ambition, wants to do things, just no stamina/fatigues. Not SOB, no CP, her L knee bothers her ,but just has no stamina.  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  AFib Hx PVI ablation Oct 2013, Dec 2015, 05/21/2020  AAD Hx Sotalol  started 2018 >> stopped April 2022 with increasing AF burden Amiodarone  started April 2022 >> stopped Nov 2024 2/2 c/o SOB(CT neg for signs of amio tox/p.fibrosis) Amiodarone  re-started may 2025  Past Medical History:  Diagnosis Date   Adrenal adenoma 6/11   lt on ct 6/11   Atrial fibrillation (HCC) 01/21/2011   Atrial flutter (HCC) 05/16/2012   Basal cell carcinoma 11/25/2020   nod- Right ala nasi (MOHS)   BCC (basal cell carcinoma of skin) 05/22/2013   Right Upper Lip (curet, cautery, and excision)   BCC (basal cell carcinoma of skin) 08/24/2013   Right Upper Lip(Sclerosis + margin) (MOH's)   Bradycardia 01/21/2011   Coronary artery calcification seen on CT scan 11/01/2018   GERD (gastroesophageal reflux disease)    Hypercholesteremia    Hypertension    Kidney stone 6/11   rt. 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 PVI 10/13 b. PVI 12/15   Superficial nodular basal cell carcinoma (BCC) 06/13/2019   Above Left Brow (curet and 5FU)   Superficial nodular basal cell carcinoma (BCC) 06/13/2019   Right Cheekbone (curet and 5FU)    Past Surgical History:  Procedure Laterality Date   ATRIAL FIBRILLATION ABLATION N/A 06/07/2012   PVI and CTI ablation by Dr Kelsie   ATRIAL FIBRILLATION ABLATION N/A 07/31/2014   PVI by Dr Kelsie; initial ablation 2013   ATRIAL FIBRILLATION ABLATION N/A 05/21/2020   Procedure: ATRIAL FIBRILLATION ABLATION;  Surgeon: Kelsie Agent, MD;  Location: [facility] INVASIVE CV LAB;  Service: Cardiovascular;  Laterality: N/A;   CARDIOVERSION  05/06/2012   Procedure: CARDIOVERSION;  Surgeon: Wilbert JONELLE Bihari, MD;  Location: [facility] ENDOSCOPY;  Service: Cardiovascular;  Laterality: N/A;  h/p in file drawer   CARDIOVERSION N/A  10/06/2017   Procedure: CARDIOVERSION;  Surgeon: Raford Riggs, MD;  Location: [facility] ENDOSCOPY;  Service: Cardiovascular;  Laterality: N/A;   CARDIOVERSION N/A 03/28/2018   Procedure: CARDIOVERSION;  Surgeon: Maranda Leim DEL, MD;  Location: [facility] ENDOSCOPY;  Service: Cardiovascular;  Laterality: N/A;   CARDIOVERSION N/A 02/09/2019   Procedure: CARDIOVERSION;  Surgeon: Mona Vinie BROCKS, MD;  Location: [facility] ENDOSCOPY;  Service: Cardiovascular;  Laterality: N/A;   CESAREAN SECTION  1978-1979   x-2   CHOLECYSTECTOMY  1989   ELECTROPHYSIOLOGIC STUDY N/A 09/08/2016   Procedure: Cardioversion;  Surgeon: Agent Kelsie, MD;  Location: [facility] INVASIVE CV LAB;  Service: Cardiovascular;  Laterality: N/A;   EP IMPLANTABLE DEVICE N/A 09/08/2016   Procedure: Loop Recorder Removal;  Surgeon: Agent Kelsie, MD;  Location: [facility] INVASIVE CV LAB;  Service: Cardiovascular;  Laterality: N/A;   EP IMPLANTABLE DEVICE N/A 09/08/2016   Procedure: Pacemaker Implant;  Surgeon: Agent Kelsie,  MD;  Location: [facility] INVASIVE CV LAB;  Service: Cardiovascular;  Laterality: N/A;   Implantable loop recorder implantation  05/30/14   Medtronic Reveal LINQ implanted by Dr Kelsie with RIO II protocol (ambulatory setting)   TEE WITHOUT CARDIOVERSION  06/06/2012   Procedure: TRANSESOPHAGEAL ECHOCARDIOGRAM (TEE);  Surgeon: Candyce CANDIE Reek, MD;  Location: [facility] ENDOSCOPY;  Service: Cardiovascular;  Laterality: N/A;   TEE WITHOUT CARDIOVERSION N/A 07/30/2014   Procedure: TRANSESOPHAGEAL ECHOCARDIOGRAM (TEE);  Surgeon: Ezra GORMAN Shuck, MD;  Location: [facility] ENDOSCOPY;  Service: Cardiovascular;  Laterality: N/A;   TEE WITHOUT CARDIOVERSION N/A 10/06/2017   Procedure: TRANSESOPHAGEAL ECHOCARDIOGRAM (TEE);  Surgeon: Raford Riggs, MD;  Location: [facility] ENDOSCOPY;  Service: Cardiovascular;  Laterality: N/A;   TEE WITHOUT CARDIOVERSION N/A 03/28/2018   Procedure: TRANSESOPHAGEAL ECHOCARDIOGRAM (TEE);  Surgeon: Maranda Leim DEL, MD;  Location: [facility] ENDOSCOPY;   Service: Cardiovascular;  Laterality: N/A;   TEE WITHOUT CARDIOVERSION N/A 09/30/2018   Procedure: TRANSESOPHAGEAL ECHOCARDIOGRAM (TEE);  Surgeon: Loni Soyla LABOR, MD;  Location: [facility] ENDOSCOPY;  Service: Cardiovascular;  Laterality: N/A;   TEE WITHOUT CARDIOVERSION N/A 02/09/2019   Procedure: TRANSESOPHAGEAL ECHOCARDIOGRAM (TEE);  Surgeon: Mona Vinie BROCKS, MD;  Location: [facility] ENDOSCOPY;  Service: Cardiovascular;  Laterality: N/A;    Current Outpatient Medications  Medication Sig Dispense Refill   acetaminophen  (TYLENOL ) 500 MG tablet Take 250-500 mg by mouth every 6 (six) hours as needed for moderate pain.     alum & mag hydroxide-simeth (MAALOX/MYLANTA) 200-200-20 MG/5ML suspension Take 15 mLs by mouth every 6 (six) hours as needed for indigestion or heartburn.     amiodarone  (PACERONE ) 200 MG tablet Take 1 tablet (200 mg total) by mouth daily. 90 tablet 2   amLODipine  (NORVASC ) 5 MG tablet Take 5 mg by mouth daily.     calcium  carbonate (OS-CAL) 600 MG TABS tablet Take 600 mg by mouth 2 (two) times daily with a meal.      calcium  carbonate (TUMS - DOSED IN MG ELEMENTAL CALCIUM ) 500 MG chewable tablet Chew 1-2 tablets by mouth 3 (three) times daily as needed for indigestion or heartburn.     Cholecalciferol (VITAMIN D) 50 MCG (2000 UT) tablet Take 2,000 Units by mouth every evening.      Ferrous Gluconate (IRON 27 PO) Take 1 tablet by mouth daily.     furosemide  (LASIX ) 20 MG tablet TAKE 1 TABLET BY MOUTH EVERY DAY 90 tablet 3   pantoprazole  (PROTONIX ) 40 MG tablet Take 40 mg by mouth daily before breakfast.      Polyethyl Glycol-Propyl Glycol (SYSTANE ULTRA PF OP) Place 1 drop into both eyes daily as needed (dry eye).      polyethylene glycol (MIRALAX / GLYCOLAX) 17 g packet Take 17 g by mouth daily as needed for mild constipation or moderate constipation.     Rivaroxaban  (XARELTO ) 15 MG TABS tablet Take 1 tablet (15 mg total) by mouth daily with supper. 90 tablet 1   rosuvastatin  (CRESTOR ) 20  MG tablet TAKE ONE TABLET BY MOUTH ONCE DAILY 90 tablet 1   traZODone  (DESYREL ) 50 MG tablet Take 100 mg by mouth at bedtime.     Wheat Dextrin (BENEFIBER DRINK MIX PO) Take 1 Applicatorful by mouth daily.     No current facility-administered medications for this visit.    Allergies:   Morphine and codeine and Codeine   Social History:  The patient  reports that she has never smoked. She has never used smokeless tobacco. She reports that she does not drink alcohol  and does not use drugs.   Family History:  The patient's family history includes Atrial fibrillation in her sister; CAD in her father; Heart attack in her brother and father; Hip fracture in her mother; Lung cancer in her brother; Other in her brother; Ovarian cancer in her sister; Thyroid  cancer in her sister.  ROS:  Please see the history of present illness.    All other systems are reviewed and otherwise negative.   PHYSICAL EXAM:  VS:  There were no vitals taken for this visit. BMI: There is no height or weight on file to calculate BMI. Well nourished, well developed, in no acute distress HEENT: normocephalic, atraumatic Neck: no JVD, carotid bruits or masses Cardiac: RRR; no significant murmurs, no rubs, or gallops Lungs: CTA b/l, no wheezing, rhonchi or rales Abd: soft, nontender MS: no deformity or atrophy Ext: no edema Skin: warm and dry, no rash Neuro:  No gross deficits appreciated Psych: euthymic mood, full affect  PPM site is stable, no tethering or discomfort   EKG:  not done today  Device interrogation done today for arrhythmia burden and reviewed by myself:  Battery and auto lead measurements are good + AFlutter, rate controlled Longest episode 1hr (overnight and was unaware), otherwise seconds to a few minutes Burden 0.2%  07/29/23: TTE 1. Left ventricular ejection fraction, by estimation, is 65 to 70%. Left  ventricular ejection fraction by 3D volume is 69 %. The left ventricle has  normal  function. The left ventricle has no regional wall motion  abnormalities. Left ventricular diastolic   parameters are consistent with Grade II diastolic dysfunction  (pseudonormalization). Elevated left ventricular end-diastolic pressure.   2. Right ventricular systolic function is mildly reduced. The right  ventricular size is normal. There is normal pulmonary artery systolic  pressure. The estimated right ventricular systolic pressure is 31.7 mmHg.   3. Left atrial size was severely dilated.   4. Right atrial size was moderately dilated.   5. The mitral valve is abnormal. Mild mitral valve regurgitation.   6. Tricuspid valve regurgitation is mild to moderate.   7. The aortic valve is calcified. Aortic valve regurgitation is trivial.  Aortic valve sclerosis and calcification without significant stenosis.  Aortic valve area, by VTI measures 1.76 cm. Aortic valve mean gradient  measures 9.5 mmHg. Aortic valve Vmax  measures 2.03 m/s. Peak gradient 16.5 mmHg, DI 0.56.   8. The inferior vena cava is normal in size with greater than 50%  respiratory variability, suggesting right atrial pressure of 3 mmHg.    04/24/2020: TTE IMPRESSIONS   1. Left ventricular ejection fraction, by estimation, is 60 to 65%. The  left ventricle has normal function. The left ventricle has no regional  wall motion abnormalities. Left ventricular diastolic parameters are  consistent with Grade II diastolic  dysfunction (pseudonormalization).   2. Right ventricular systolic function is normal. The right ventricular  size is mildly enlarged. There is normal pulmonary artery systolic  pressure. The estimated right ventricular systolic pressure is 33.5 mmHg.   3. Left atrial size was moderately dilated.   4. Right atrial size was mildly dilated.   5. The mitral valve is normal in structure. No evidence of mitral valve  regurgitation. No evidence of mitral stenosis.   6. The aortic valve is tricuspid. Aortic valve  regurgitation is trivial.  Mild to moderate aortic valve sclerosis/calcification is present, without  any evidence of aortic stenosis. Aortic valve mean gradient measures 5.0  mmHg. Aortic valve  Vmax measures  1.54 m/s.   7. The inferior vena cava is normal in size with greater than 50%  respiratory variability, suggesting right atrial pressure of 3 mmHg.   Comparison(s): No significant change from prior study. Prior images  reviewed side by side.    05/21/2020: EPS/Ablation CONCLUSIONS: 1.  Sinus rhythm upon presentation.   2. Intracardiac echo reveals a moderate sized left atrium with four separate pulmonary veins without evidence of pulmonary vein stenosis. 3. Minimal return of conduction along the carina between the left superior and inferior pulmonary veins.  Ablation was performed at this location.   4. Return of electrical activity along the right pulmonary veins with successful sequential electrical re isolation and anatomical encircling of right right pulmonary veins using a WACA approach 5. Additional left atrial ablation was performed with a standard box lesion created along the posterior wall of the left atrium 6. No early apparent complications.   12/14/2018: stress myoview  The left ventricular ejection fraction is normal (55-65%). Nuclear stress EF: 61%. No T wave inversion was noted during stress. This is a low risk study.   Normal perfusion. LVEF 61% with normal wall motion. This is a low risk study.      Recent Labs: 01/14/2024: BUN 19; Creatinine, Ser 1.05; Hemoglobin 13.3; Platelets 209; Sodium 138 01/26/2024: Potassium 4.6 03/15/2024: ALT 18; TSH 1.430  No results found for requested labs within last 365 days.   CrCl cannot be calculated (Patient's most recent lab result is older than the maximum 21 days allowed.).   Wt Readings from Last 3 Encounters:  03/15/24 157 lb 1.6 oz (71.3 kg)  01/14/24 157 lb (71.2 kg)  10/04/23 155 lb 12.8 oz (70.7 kg)      Other studies reviewed: Additional studies/records reviewed today include: summarized above  ASSESSMENT AND PLAN:  PPM Intact function No programming changes  Paroxysmal Afib CHA2DS2Vasc is 4, on Xarelto , appropriately dosed   0.2 % burden Back on amiodarone  Labs today  HTN She takes her amlodipine  and metoprolol  at night Home readings generally 140/80 on average, though she does get lower readings 100's/70's Rarely 90's systolic > these make her feel bad  Will be weaning off her metoprolol  to see if her fatigue/energy improves I do not suspect her BP will rise sharply or much, we discussed monitoring BP at home and if persistent >160/80 to let us /her PMD know  25mg  QOD for 5 days the every other night for 5 doses then stop  4. Fatigue Somewhat chronic ***   5.  Secondary hypercoagulable state   Disposition: back in ***, sooner if needed   Current medicines are reviewed at length with the patient today.  The patient did not have any concerns regarding medicines.  Bonney Charlies Arthur, PA-C 05/10/2024 5:43 PM     [facility] [address] [place] [address] [phone] (office)  [phone] (fax)

## 2024-05-11 ENCOUNTER — Ambulatory Visit: Attending: Cardiology | Admitting: Physician Assistant

## 2024-05-11 ENCOUNTER — Encounter: Payer: Self-pay | Admitting: Physician Assistant

## 2024-05-11 VITALS — BP 134/78 | HR 79 | Ht 63.0 in | Wt 153.0 lb

## 2024-05-11 DIAGNOSIS — Z79899 Other long term (current) drug therapy: Secondary | ICD-10-CM | POA: Diagnosis not present

## 2024-05-11 DIAGNOSIS — I10 Essential (primary) hypertension: Secondary | ICD-10-CM

## 2024-05-11 DIAGNOSIS — I480 Paroxysmal atrial fibrillation: Secondary | ICD-10-CM

## 2024-05-11 DIAGNOSIS — D6869 Other thrombophilia: Secondary | ICD-10-CM

## 2024-05-11 DIAGNOSIS — R5383 Other fatigue: Secondary | ICD-10-CM

## 2024-05-11 DIAGNOSIS — Z950 Presence of cardiac pacemaker: Secondary | ICD-10-CM | POA: Diagnosis not present

## 2024-05-11 MED ORDER — METOPROLOL TARTRATE 25 MG PO TABS: 25.0000 mg | ORAL_TABLET | Freq: Four times a day (QID) | ORAL | 1 refills | Status: AC | PRN

## 2024-05-11 NOTE — Patient Instructions (Addendum)
 Medication Instructions:    START TAKING :  METOPROLOL  25 MG  AS NEEDED EVERY 6 HOURS  FOR AFIB / PALPATIONS   *If you need a refill on your cardiac medications before your next appointment, please call your pharmacy*  Lab Work:  PLEASE GO DOWN STAIRS  [facility]  FIRST FLOOR   ( GET OFF ELEVATORS WALK TOWARDS WAITING AREA LAB LOCATED BY PHARMACY):  LFT AND  TSH TODAY       If you have labs (blood work) drawn today and your tests are completely normal, you will receive your results only by: MyChart Message (if you have MyChart) OR A paper copy in the mail If you have any lab test that is abnormal or we need to change your treatment, we will call you to review the results.    Testing/Procedures: NONE ORDERED  TODAY    Follow-Up: At [facility], you and your health needs are our priority.  As part of our continuing mission to provide you with exceptional heart care, our providers are all part of one team.  This team includes your primary Cardiologist (physician) and Advanced Practice Providers or APPs (Physician Assistants and Nurse Practitioners) who all work together to provide you with the care you need, when you need it.   Your next appointment:   6 month(s)  Provider:   You may see Charlies Arthur, PA-C    We recommend signing up for the patient portal called MyChart.  Sign up information is provided on this After Visit Summary.  MyChart is used to connect with patients for Virtual Visits (Telemedicine).  Patients are able to view lab/test results, encounter notes, upcoming appointments, etc.  Non-urgent messages can be sent to your provider as well.   To learn more about what you can do with MyChart, go to [email or web address].   Other Instructions

## 2024-05-12 ENCOUNTER — Ambulatory Visit: Payer: Self-pay

## 2024-05-12 LAB — HEPATIC FUNCTION PANEL
ALT: 17 IU/L (ref 0–32)
AST: 24 IU/L (ref 0–40)
Albumin: 4.4 g/dL (ref 3.7–4.7)
Alkaline Phosphatase: 97 IU/L (ref 48–129)
Bilirubin Total: 0.4 mg/dL (ref 0.0–1.2)
Bilirubin, Direct: 0.19 mg/dL (ref 0.00–0.40)
Total Protein: 7 g/dL (ref 6.0–8.5)

## 2024-05-12 LAB — TSH: TSH: 1.1 u[IU]/mL (ref 0.450–4.500)

## 2024-05-16 DIAGNOSIS — E782 Mixed hyperlipidemia: Secondary | ICD-10-CM | POA: Diagnosis not present

## 2024-05-16 DIAGNOSIS — I10 Essential (primary) hypertension: Secondary | ICD-10-CM | POA: Diagnosis not present

## 2024-05-16 DIAGNOSIS — I480 Paroxysmal atrial fibrillation: Secondary | ICD-10-CM | POA: Diagnosis not present

## 2024-05-19 NOTE — Progress Notes (Signed)
 Remote PPM Transmission

## 2024-06-07 ENCOUNTER — Ambulatory Visit: Payer: Medicare Other

## 2024-06-07 DIAGNOSIS — I480 Paroxysmal atrial fibrillation: Secondary | ICD-10-CM | POA: Diagnosis not present

## 2024-06-08 LAB — CUP PACEART REMOTE DEVICE CHECK
Battery Remaining Longevity: 25 mo
Battery Voltage: 2.93 V
Brady Statistic AP VP Percent: 0.07 %
Brady Statistic AP VS Percent: 94 %
Brady Statistic AS VP Percent: 0 %
Brady Statistic AS VS Percent: 5.92 %
Brady Statistic RA Percent Paced: 93.92 %
Brady Statistic RV Percent Paced: 0.08 %
Date Time Interrogation Session: 20251022123101
Implantable Lead Connection Status: 753985
Implantable Lead Connection Status: 753985
Implantable Lead Implant Date: 20180123
Implantable Lead Implant Date: 20180123
Implantable Lead Location: 753859
Implantable Lead Location: 753860
Implantable Lead Model: 5076
Implantable Lead Model: 5076
Implantable Pulse Generator Implant Date: 20180123
Lead Channel Impedance Value: 304 Ohm
Lead Channel Impedance Value: 342 Ohm
Lead Channel Impedance Value: 437 Ohm
Lead Channel Impedance Value: 513 Ohm
Lead Channel Pacing Threshold Amplitude: 0.5 V
Lead Channel Pacing Threshold Amplitude: 0.75 V
Lead Channel Pacing Threshold Pulse Width: 0.4 ms
Lead Channel Pacing Threshold Pulse Width: 0.4 ms
Lead Channel Sensing Intrinsic Amplitude: 2.125 mV
Lead Channel Sensing Intrinsic Amplitude: 2.125 mV
Lead Channel Sensing Intrinsic Amplitude: 7 mV
Lead Channel Sensing Intrinsic Amplitude: 7 mV
Lead Channel Setting Pacing Amplitude: 2 V
Lead Channel Setting Pacing Amplitude: 2.5 V
Lead Channel Setting Pacing Pulse Width: 0.4 ms
Lead Channel Setting Sensing Sensitivity: 2 mV
Zone Setting Status: 755011
Zone Setting Status: 755011

## 2024-06-09 NOTE — Progress Notes (Signed)
 Remote PPM Transmission

## 2024-06-13 ENCOUNTER — Ambulatory Visit: Payer: Self-pay | Admitting: Cardiology

## 2024-08-29 ENCOUNTER — Other Ambulatory Visit: Payer: Self-pay | Admitting: Cardiology

## 2024-08-29 MED ORDER — FUROSEMIDE 20 MG PO TABS: 20.0000 mg | ORAL_TABLET | Freq: Every day | ORAL | 2 refills | Status: AC

## 2024-08-29 NOTE — Addendum Note (Signed)
 Addended by: BLUFORD RAMP D on: 08/29/2024 04:10 PM   Modules accepted: Orders

## 2024-09-06 ENCOUNTER — Ambulatory Visit: Payer: Medicare Other

## 2024-09-06 DIAGNOSIS — I480 Paroxysmal atrial fibrillation: Secondary | ICD-10-CM | POA: Diagnosis not present

## 2024-09-07 ENCOUNTER — Ambulatory Visit: Payer: Self-pay | Admitting: Cardiology

## 2024-09-07 LAB — CUP PACEART REMOTE DEVICE CHECK
Battery Remaining Longevity: 22 mo
Battery Voltage: 2.92 V
Brady Statistic AP VP Percent: 0.32 %
Brady Statistic AP VS Percent: 91.9 %
Brady Statistic AS VP Percent: 2.27 %
Brady Statistic AS VS Percent: 5.51 %
Brady Statistic RA Percent Paced: 91.97 %
Brady Statistic RV Percent Paced: 2.59 %
Date Time Interrogation Session: 20260121124400
Implantable Lead Connection Status: 753985
Implantable Lead Connection Status: 753985
Implantable Lead Implant Date: 20180123
Implantable Lead Implant Date: 20180123
Implantable Lead Location: 753859
Implantable Lead Location: 753860
Implantable Lead Model: 5076
Implantable Lead Model: 5076
Implantable Pulse Generator Implant Date: 20180123
Lead Channel Impedance Value: 304 Ohm
Lead Channel Impedance Value: 342 Ohm
Lead Channel Impedance Value: 418 Ohm
Lead Channel Impedance Value: 475 Ohm
Lead Channel Pacing Threshold Amplitude: 0.625 V
Lead Channel Pacing Threshold Amplitude: 0.875 V
Lead Channel Pacing Threshold Pulse Width: 0.4 ms
Lead Channel Pacing Threshold Pulse Width: 0.4 ms
Lead Channel Sensing Intrinsic Amplitude: 2.5 mV
Lead Channel Sensing Intrinsic Amplitude: 2.5 mV
Lead Channel Sensing Intrinsic Amplitude: 6.875 mV
Lead Channel Sensing Intrinsic Amplitude: 6.875 mV
Lead Channel Setting Pacing Amplitude: 2 V
Lead Channel Setting Pacing Amplitude: 2.5 V
Lead Channel Setting Pacing Pulse Width: 0.4 ms
Lead Channel Setting Sensing Sensitivity: 2 mV
Zone Setting Status: 755011
Zone Setting Status: 755011

## 2024-09-09 NOTE — Progress Notes (Signed)
 Remote PPM Transmission

## 2024-11-09 ENCOUNTER — Ambulatory Visit: Admitting: Physician Assistant

## 2024-12-06 ENCOUNTER — Ambulatory Visit: Payer: Medicare Other

## 2025-03-07 ENCOUNTER — Ambulatory Visit: Payer: Medicare Other

## 2025-06-06 ENCOUNTER — Ambulatory Visit: Payer: Medicare Other

## 2025-09-05 ENCOUNTER — Ambulatory Visit: Payer: Medicare Other
